# Patient Record
Sex: Male | Born: 1955 | Race: Black or African American | Hispanic: No | Marital: Married | Smoking: Current every day smoker
Health system: Southern US, Community
[De-identification: ages and names within clinical notes are randomized; demographics above are authoritative.]

## PROBLEM LIST (undated history)

## (undated) DIAGNOSIS — C383 Malignant neoplasm of mediastinum, part unspecified: Secondary | ICD-10-CM

## (undated) HISTORY — DX: Malignant neoplasm of mediastinum, part unspecified: C38.3

## (undated) MED FILL — Fosaprepitant Dimeglumine For IV Infusion 150 MG (Base Eq): INTRAVENOUS | Qty: 5 | Status: AC

---

## 2020-06-12 DIAGNOSIS — C3400 Malignant neoplasm of unspecified main bronchus: Secondary | ICD-10-CM | POA: Diagnosis not present

## 2020-06-28 LAB — CREATININE, SERUM: Creatinine: 0.8 (ref <=1.3)

## 2020-06-29 ENCOUNTER — Encounter: Payer: Self-pay | Admitting: *Deleted

## 2020-06-29 ENCOUNTER — Other Ambulatory Visit: Payer: Self-pay | Admitting: *Deleted

## 2020-06-29 NOTE — Patient Outreach (Signed)
[facility]) Care Management [facility] CM Telephone Outreach, insurance referral, new patient Unsuccessful outreach attempt  06/29/2020  Cali Cuartas September 05, 1956 [id number]  Unsuccessful outreach attempt to Rosann Auerbach, 64 y/o male, referred to [place] 06/27/2020 by [facility] CMA on insurance referral ([place]).  According to referral notes, patient had recent hospitalization at [facility], June 01, 2020.  Unable to leave HIPAA compliant voice mail message for patient, requesting return call back; phone rang multiple times without physical or voice mail pick up.  Plan:  [facility] CM unsuccessful patient outreach letter in mail requesting call back in writing  Will re-attempt [facility] CM telephone outreach within 4 business days if I do not hear back from patient first.  Oneta Rack, RN, BSN, [place] Coordinator [facility] Care Management  [phone]

## 2020-07-01 ENCOUNTER — Encounter: Payer: Self-pay | Admitting: Pharmacist

## 2020-07-01 DIAGNOSIS — C383 Malignant neoplasm of mediastinum, part unspecified: Secondary | ICD-10-CM

## 2020-07-04 ENCOUNTER — Encounter: Payer: Self-pay | Admitting: *Deleted

## 2020-07-04 ENCOUNTER — Other Ambulatory Visit: Payer: Self-pay | Admitting: *Deleted

## 2020-07-04 NOTE — Patient Outreach (Signed)
[facility]) Care Management [facility] CM Telephone Outreach, new patient, insurance referral Unsuccessful (consecutive) outreach attempt # 2- new patient  07/04/2020  Spencer Butler [birth year] [id number]  Unsuccessful (consecutive) second outreach attempt to Spencer Butler, 64 y/o male, referred to [place] 06/27/2020 by [facility] CMA on insurance referral ([place]).  According to referral notes, patient had recent hospitalization at [facility], June 01, 2020.  HIPAA compliant voice mail message left for patient, requesting return call back.  Plan:  Verified [facility] CM unsuccessful patient outreach letter in mail requesting call back in writing on 06/29/20  Will re-attempt [facility] CM telephone outreach within 4 business days if I do not hear back from patient first  Oneta Rack, RN, BSN, [employer] Coordinator [facility] Care Management  [phone]

## 2020-07-09 ENCOUNTER — Other Ambulatory Visit: Payer: Self-pay | Admitting: Hematology and Oncology

## 2020-07-09 ENCOUNTER — Encounter: Payer: Self-pay | Admitting: *Deleted

## 2020-07-09 ENCOUNTER — Other Ambulatory Visit: Payer: Self-pay | Admitting: *Deleted

## 2020-07-09 DIAGNOSIS — C383 Malignant neoplasm of mediastinum, part unspecified: Secondary | ICD-10-CM | POA: Diagnosis not present

## 2020-07-09 LAB — BASIC METABOLIC PANEL
BUN: 11 (ref 4–21)
CO2: 27 — AB (ref 13–22)
Chloride: 105 (ref 99–108)
Creatinine: 0.7 (ref 0.6–1.3)
Glucose: 94
Potassium: 4.2 (ref 3.4–5.3)
Sodium: 140 (ref 137–147)

## 2020-07-09 LAB — HEPATIC FUNCTION PANEL
ALT: 14 (ref 10–40)
AST: 21 (ref 14–40)
Alkaline Phosphatase: 107 (ref 25–125)
Bilirubin, Total: 0.4

## 2020-07-09 LAB — COMPREHENSIVE METABOLIC PANEL
Albumin: 4 (ref 3.5–5.0)
Calcium: 9.6 (ref 8.7–10.7)

## 2020-07-09 LAB — CBC AND DIFFERENTIAL
HCT: 38 — AB (ref 41–53)
Hemoglobin: 12.4 — AB (ref 13.5–17.5)
Neutrophils Absolute: 7
Platelets: 262 (ref 150–399)
WBC: 9.8

## 2020-07-09 LAB — CBC: RBC: 4.76 (ref 3.87–5.11)

## 2020-07-09 NOTE — Patient Outreach (Signed)
[facility] [facility]) Care Management [facility] CM Telephone Outreach, insurance referral Unsuccessful (consecutive) third outreach attempt- new patient  07/09/2020  Doyle Kunath 1955-11-09 [id number]  Unsuccessful (consecutive) third outreach attempt to Rosann Auerbach, 64 y/o male, referred to [place] 06/27/2020 by [facility] CMA on insurance referral ([place]).  According to referral notes, patient had recent hospitalization at [facility], June 01, 2020.  HIPAA compliant voice mail message left for patient, requesting return call back.  Plan:  Verified [facility] CM unsuccessful patient outreach letter in mail requesting call back in writing on 06/29/20  Will re-attempt [facility] CM telephone outreachfor final attempt in 4 weeksif I do not hear back from patient first  Oneta Rack, RN, BSN, [employer] Coordinator [facility] Care Management  [phone]

## 2020-07-10 ENCOUNTER — Telehealth: Payer: Self-pay | Admitting: Oncology

## 2020-07-10 ENCOUNTER — Other Ambulatory Visit: Payer: Self-pay

## 2020-07-10 ENCOUNTER — Inpatient Hospital Stay: Payer: 59 | Attending: Oncology

## 2020-07-10 VITALS — BP 142/74 | HR 64 | Temp 98.1°F | Resp 18 | Ht 67.0 in | Wt 120.0 lb

## 2020-07-10 DIAGNOSIS — C381 Malignant neoplasm of anterior mediastinum: Secondary | ICD-10-CM | POA: Insufficient documentation

## 2020-07-10 DIAGNOSIS — C383 Malignant neoplasm of mediastinum, part unspecified: Secondary | ICD-10-CM

## 2020-07-10 DIAGNOSIS — Z5111 Encounter for antineoplastic chemotherapy: Secondary | ICD-10-CM | POA: Diagnosis not present

## 2020-07-10 MED ORDER — SODIUM CHLORIDE 0.9 % IV SOLN
150.0000 mg | Freq: Once | INTRAVENOUS | Status: AC
  Administered 2020-07-10: 150 mg via INTRAVENOUS
  Filled 2020-07-10: qty 150

## 2020-07-10 MED ORDER — PALONOSETRON HCL INJECTION 0.25 MG/5ML
INTRAVENOUS | Status: AC
  Filled 2020-07-10: qty 5

## 2020-07-10 MED ORDER — SODIUM CHLORIDE 0.9 % IV SOLN
Freq: Once | INTRAVENOUS | Status: AC
  Filled 2020-07-10: qty 250

## 2020-07-10 MED ORDER — SODIUM CHLORIDE 0.9 % IV SOLN
10.0000 mg | Freq: Once | INTRAVENOUS | Status: AC
  Administered 2020-07-10: 10 mg via INTRAVENOUS
  Filled 2020-07-10: qty 1

## 2020-07-10 MED ORDER — HEPARIN SOD (PORK) LOCK FLUSH 100 UNIT/ML IV SOLN
500.0000 [IU] | Freq: Once | INTRAVENOUS | Status: AC | PRN
  Administered 2020-07-10: 500 [IU]
  Filled 2020-07-10: qty 5

## 2020-07-10 MED ORDER — SODIUM CHLORIDE 0.9 % IV SOLN
75.0000 mg/m2 | Freq: Once | INTRAVENOUS | Status: AC
  Administered 2020-07-10: 121 mg via INTRAVENOUS
  Filled 2020-07-10: qty 121

## 2020-07-10 MED ORDER — SODIUM CHLORIDE 0.9 % IV SOLN
100.0000 mg/m2 | Freq: Once | INTRAVENOUS | Status: AC
  Administered 2020-07-10: 160 mg via INTRAVENOUS
  Filled 2020-07-10: qty 8

## 2020-07-10 MED ORDER — SODIUM CHLORIDE 0.9% FLUSH
10.0000 mL | INTRAVENOUS | Status: DC | PRN
  Administered 2020-07-10: 10 mL
  Filled 2020-07-10: qty 10

## 2020-07-10 MED ORDER — PALONOSETRON HCL INJECTION 0.25 MG/5ML
0.2500 mg | Freq: Once | INTRAVENOUS | Status: AC
  Administered 2020-07-10: 0.25 mg via INTRAVENOUS

## 2020-07-10 MED ORDER — SODIUM CHLORIDE 0.9 % IV SOLN
Freq: Once | INTRAVENOUS | Status: AC
  Filled 2020-07-10: qty 10

## 2020-07-10 NOTE — Telephone Encounter (Signed)
Scheduled appts per treatment plan. Gave pt an appt calendar through November

## 2020-07-10 NOTE — Patient Instructions (Signed)
Etoposide, VP-16 injection What is this medicine? ETOPOSIDE, VP-16 (e toe POE side) is a chemotherapy drug. It is used to treat testicular cancer, lung cancer, and other cancers. This medicine may be used for other purposes; ask your health care provider or pharmacist if you have questions. COMMON BRAND NAME(S): Etopophos, Toposar, VePesid What should I tell my health care provider before I take this medicine? They need to know if you have any of these conditions:  infection  kidney disease  liver disease  low blood counts, like low white cell, platelet, or red cell counts  an unusual or allergic reaction to etoposide, other medicines, foods, dyes, or preservatives  pregnant or trying to get pregnant  breast-feeding How should I use this medicine? This medicine is for infusion into a vein. It is administered in a hospital or clinic by a specially trained health care professional. Talk to your pediatrician regarding the use of this medicine in children. Special care may be needed. Overdosage: If you think you have taken too much of this medicine contact a poison control center or emergency room at once. NOTE: This medicine is only for you. Do not share this medicine with others. What if I miss a dose? It is important not to miss your dose. Call your doctor or health care professional if you are unable to keep an appointment. What may interact with this medicine? This medicine may interact with the following medications:  warfarin This list may not describe all possible interactions. Give your health care provider a list of all the medicines, herbs, non-prescription drugs, or dietary supplements you use. Also tell them if you smoke, drink alcohol, or use illegal drugs. Some items may interact with your medicine. What should I watch for while using this medicine? Visit your doctor for checks on your progress. This drug may make you feel generally unwell. This is not uncommon, as  chemotherapy can affect healthy cells as well as cancer cells. Report any side effects. Continue your course of treatment even though you feel ill unless your doctor tells you to stop. In some cases, you may be given additional medicines to help with side effects. Follow all directions for their use. Call your doctor or health care professional for advice if you get a fever, chills or sore throat, or other symptoms of a cold or flu. Do not treat yourself. This drug decreases your body's ability to fight infections. Try to avoid being around people who are sick. This medicine may increase your risk to bruise or bleed. Call your doctor or health care professional if you notice any unusual bleeding. Talk to your doctor about your risk of cancer. You may be more at risk for certain types of cancers if you take this medicine. Do not become pregnant while taking this medicine or for at least 6 months after stopping it. Women should inform their doctor if they wish to become pregnant or think they might be pregnant. Women of child-bearing potential will need to have a negative pregnancy test before starting this medicine. There is a potential for serious side effects to an unborn child. Talk to your health care professional or pharmacist for more information. Do not breast-feed an infant while taking this medicine. Men must use a latex condom during sexual contact with a woman while taking this medicine and for at least 4 months after stopping it. A latex condom is needed even if you have had a vasectomy. Contact your doctor right away if your partner  becomes pregnant. Do not donate sperm while taking this medicine and for at least 4 months after you stop taking this medicine. Men should inform their doctors if they wish to father a child. This medicine may lower sperm counts. What side effects may I notice from receiving this medicine? Side effects that you should report to your doctor or health care professional  as soon as possible:  allergic reactions like skin rash, itching or hives, swelling of the face, lips, or tongue  low blood counts - this medicine may decrease the number of white blood cells, red blood cells, and platelets. You may be at increased risk for infections and bleeding  nausea, vomiting  redness, blistering, peeling or loosening of the skin, including inside the mouth  signs and symptoms of infection like fever; chills; cough; sore throat; pain or trouble passing urine  signs and symptoms of low red blood cells or anemia such as unusually weak or tired; feeling faint or lightheaded; falls; breathing problems  unusual bruising or bleeding Side effects that usually do not require medical attention (report to your doctor or health care professional if they continue or are bothersome):  changes in taste  diarrhea  hair loss  loss of appetite  mouth sores This list may not describe all possible side effects. Call your doctor for medical advice about side effects. You may report side effects to FDA at 1-800-FDA-1088. Where should I keep my medicine? This drug is given in a hospital or clinic and will not be stored at home. NOTE: This sheet is a summary. It may not cover all possible information. If you have questions about this medicine, talk to your doctor, pharmacist, or health care provider.  2020 Elsevier/Gold Standard (2018-11-10 16:57:15) Cisplatin injection What is this medicine? CISPLATIN (SIS pla tin) is a chemotherapy drug. It targets fast dividing cells, like cancer cells, and causes these cells to die. This medicine is used to treat many types of cancer like bladder, ovarian, and testicular cancers. This medicine may be used for other purposes; ask your health care provider or pharmacist if you have questions. COMMON BRAND NAME(S): Platinol, Platinol -AQ What should I tell my health care provider before I take this medicine? They need to know if you have any of  these conditions:  eye disease, vision problems  hearing problems  kidney disease  low blood counts, like white cells, platelets, or red blood cells  tingling of the fingers or toes, or other nerve disorder  an unusual or allergic reaction to cisplatin, carboplatin, oxaliplatin, other medicines, foods, dyes, or preservatives  pregnant or trying to get pregnant  breast-feeding How should I use this medicine? This drug is given as an infusion into a vein. It is administered in a hospital or clinic by a specially trained health care professional. Talk to your pediatrician regarding the use of this medicine in children. Special care may be needed. Overdosage: If you think you have taken too much of this medicine contact a poison control center or emergency room at once. NOTE: This medicine is only for you. Do not share this medicine with others. What if I miss a dose? It is important not to miss a dose. Call your doctor or health care professional if you are unable to keep an appointment. What may interact with this medicine? This medicine may interact with the following medications:  foscarnet  certain antibiotics like amikacin, gentamicin, neomycin, polymyxin B, streptomycin, tobramycin, vancomycin This list may not describe all possible  interactions. Give your health care provider a list of all the medicines, herbs, non-prescription drugs, or dietary supplements you use. Also tell them if you smoke, drink alcohol, or use illegal drugs. Some items may interact with your medicine. What should I watch for while using this medicine? Your condition will be monitored carefully while you are receiving this medicine. You will need important blood work done while you are taking this medicine. This drug may make you feel generally unwell. This is not uncommon, as chemotherapy can affect healthy cells as well as cancer cells. Report any side effects. Continue your course of treatment even though  you feel ill unless your doctor tells you to stop. This medicine may increase your risk of getting an infection. Call your healthcare professional for advice if you get a fever, chills, or sore throat, or other symptoms of a cold or flu. Do not treat yourself. Try to avoid being around people who are sick. Avoid taking medicines that contain aspirin, acetaminophen, ibuprofen, naproxen, or ketoprofen unless instructed by your healthcare professional. These medicines may hide a fever. This medicine may increase your risk to bruise or bleed. Call your doctor or health care professional if you notice any unusual bleeding. Be careful brushing and flossing your teeth or using a toothpick because you may get an infection or bleed more easily. If you have any dental work done, tell your dentist you are receiving this medicine. Do not become pregnant while taking this medicine or for 14 months after stopping it. Women should inform their healthcare professional if they wish to become pregnant or think they might be pregnant. Men should not father a child while taking this medicine and for 11 months after stopping it. There is potential for serious side effects to an unborn child. Talk to your healthcare professional for more information. Do not breast-feed an infant while taking this medicine. This medicine has caused ovarian failure in some women. This medicine may make it more difficult to get pregnant. Talk to your healthcare professional if you are concerned about your fertility. This medicine has caused decreased sperm counts in some men. This may make it more difficult to father a child. Talk to your healthcare professional if you are concerned about your fertility. Drink fluids as directed while you are taking this medicine. This will help protect your kidneys. Call your doctor or health care professional if you get diarrhea. Do not treat yourself. What side effects may I notice from receiving this  medicine? Side effects that you should report to your doctor or health care professional as soon as possible:  allergic reactions like skin rash, itching or hives, swelling of the face, lips, or tongue  blurred vision  changes in vision  decreased hearing or ringing of the ears  nausea, vomiting  pain, redness, or irritation at site where injected  pain, tingling, numbness in the hands or feet  signs and symptoms of bleeding such as bloody or black, tarry stools; red or dark brown urine; spitting up blood or brown material that looks like coffee grounds; red spots on the skin; unusual bruising or bleeding from the eyes, gums, or nose  signs and symptoms of infection like fever; chills; cough; sore throat; pain or trouble passing urine  signs and symptoms of kidney injury like trouble passing urine or change in the amount of urine  signs and symptoms of low red blood cells or anemia such as unusually weak or tired; feeling faint or lightheaded; falls; breathing  problems Side effects that usually do not require medical attention (report to your doctor or health care professional if they continue or are bothersome):  loss of appetite  mouth sores  muscle cramps This list may not describe all possible side effects. Call your doctor for medical advice about side effects. You may report side effects to FDA at 1-800-FDA-1088. Where should I keep my medicine? This drug is given in a hospital or clinic and will not be stored at home. NOTE: This sheet is a summary. It may not cover all possible information. If you have questions about this medicine, talk to your doctor, pharmacist, or health care provider.  2020 Elsevier/Gold Standard (2018-09-10 15:59:17)

## 2020-07-11 ENCOUNTER — Inpatient Hospital Stay: Payer: 59

## 2020-07-11 VITALS — BP 137/75 | HR 68 | Temp 98.7°F | Resp 18 | Ht 67.0 in | Wt 121.0 lb

## 2020-07-11 DIAGNOSIS — C383 Malignant neoplasm of mediastinum, part unspecified: Secondary | ICD-10-CM

## 2020-07-11 DIAGNOSIS — Z5111 Encounter for antineoplastic chemotherapy: Secondary | ICD-10-CM | POA: Diagnosis not present

## 2020-07-11 MED ORDER — SODIUM CHLORIDE 0.9 % IV SOLN
100.0000 mg/m2 | Freq: Once | INTRAVENOUS | Status: AC
  Administered 2020-07-11: 160 mg via INTRAVENOUS
  Filled 2020-07-11: qty 8

## 2020-07-11 MED ORDER — HEPARIN SOD (PORK) LOCK FLUSH 100 UNIT/ML IV SOLN
500.0000 [IU] | Freq: Once | INTRAVENOUS | Status: AC | PRN
  Administered 2020-07-11: 500 [IU]
  Filled 2020-07-11: qty 5

## 2020-07-11 MED ORDER — SODIUM CHLORIDE 0.9 % IV SOLN
10.0000 mg | Freq: Once | INTRAVENOUS | Status: AC
  Administered 2020-07-11: 10 mg via INTRAVENOUS
  Filled 2020-07-11: qty 10

## 2020-07-11 MED ORDER — SODIUM CHLORIDE 0.9% FLUSH
10.0000 mL | INTRAVENOUS | Status: DC | PRN
  Administered 2020-07-11 (×2): 10 mL
  Filled 2020-07-11: qty 10

## 2020-07-11 MED ORDER — SODIUM CHLORIDE 0.9 % IV SOLN
Freq: Once | INTRAVENOUS | Status: AC
  Filled 2020-07-11: qty 250

## 2020-07-11 NOTE — Patient Instructions (Signed)
Etoposide, VP-16 injection What is this medicine? ETOPOSIDE, VP-16 (e toe POE side) is a chemotherapy drug. It is used to treat testicular cancer, lung cancer, and other cancers. This medicine may be used for other purposes; ask your health care provider or pharmacist if you have questions. COMMON BRAND NAME(S): Etopophos, Toposar, VePesid What should I tell my health care provider before I take this medicine? They need to know if you have any of these conditions:  infection  kidney disease  liver disease  low blood counts, like low white cell, platelet, or red cell counts  an unusual or allergic reaction to etoposide, other medicines, foods, dyes, or preservatives  pregnant or trying to get pregnant  breast-feeding How should I use this medicine? This medicine is for infusion into a vein. It is administered in a hospital or clinic by a specially trained health care professional. Talk to your pediatrician regarding the use of this medicine in children. Special care may be needed. Overdosage: If you think you have taken too much of this medicine contact a poison control center or emergency room at once. NOTE: This medicine is only for you. Do not share this medicine with others. What if I miss a dose? It is important not to miss your dose. Call your doctor or health care professional if you are unable to keep an appointment. What may interact with this medicine? This medicine may interact with the following medications:  warfarin This list may not describe all possible interactions. Give your health care provider a list of all the medicines, herbs, non-prescription drugs, or dietary supplements you use. Also tell them if you smoke, drink alcohol, or use illegal drugs. Some items may interact with your medicine. What should I watch for while using this medicine? Visit your doctor for checks on your progress. This drug may make you feel generally unwell. This is not uncommon, as  chemotherapy can affect healthy cells as well as cancer cells. Report any side effects. Continue your course of treatment even though you feel ill unless your doctor tells you to stop. In some cases, you may be given additional medicines to help with side effects. Follow all directions for their use. Call your doctor or health care professional for advice if you get a fever, chills or sore throat, or other symptoms of a cold or flu. Do not treat yourself. This drug decreases your body's ability to fight infections. Try to avoid being around people who are sick. This medicine may increase your risk to bruise or bleed. Call your doctor or health care professional if you notice any unusual bleeding. Talk to your doctor about your risk of cancer. You may be more at risk for certain types of cancers if you take this medicine. Do not become pregnant while taking this medicine or for at least 6 months after stopping it. Women should inform their doctor if they wish to become pregnant or think they might be pregnant. Women of child-bearing potential will need to have a negative pregnancy test before starting this medicine. There is a potential for serious side effects to an unborn child. Talk to your health care professional or pharmacist for more information. Do not breast-feed an infant while taking this medicine. Men must use a latex condom during sexual contact with a woman while taking this medicine and for at least 4 months after stopping it. A latex condom is needed even if you have had a vasectomy. Contact your doctor right away if your partner  becomes pregnant. Do not donate sperm while taking this medicine and for at least 4 months after you stop taking this medicine. Men should inform their doctors if they wish to father a child. This medicine may lower sperm counts. What side effects may I notice from receiving this medicine? Side effects that you should report to your doctor or health care professional  as soon as possible:  allergic reactions like skin rash, itching or hives, swelling of the face, lips, or tongue  low blood counts - this medicine may decrease the number of white blood cells, red blood cells, and platelets. You may be at increased risk for infections and bleeding  nausea, vomiting  redness, blistering, peeling or loosening of the skin, including inside the mouth  signs and symptoms of infection like fever; chills; cough; sore throat; pain or trouble passing urine  signs and symptoms of low red blood cells or anemia such as unusually weak or tired; feeling faint or lightheaded; falls; breathing problems  unusual bruising or bleeding Side effects that usually do not require medical attention (report to your doctor or health care professional if they continue or are bothersome):  changes in taste  diarrhea  hair loss  loss of appetite  mouth sores This list may not describe all possible side effects. Call your doctor for medical advice about side effects. You may report side effects to FDA at 1-800-FDA-1088. Where should I keep my medicine? This drug is given in a hospital or clinic and will not be stored at home. NOTE: This sheet is a summary. It may not cover all possible information. If you have questions about this medicine, talk to your doctor, pharmacist, or health care provider.  2020 Elsevier/Gold Standard (2018-11-10 16:57:15)

## 2020-07-11 NOTE — Progress Notes (Signed)
Pt stable at time of discharge. 

## 2020-07-12 ENCOUNTER — Other Ambulatory Visit: Payer: Self-pay

## 2020-07-12 ENCOUNTER — Inpatient Hospital Stay: Payer: 59

## 2020-07-12 VITALS — BP 139/74 | HR 64 | Temp 98.0°F | Resp 18 | Ht 67.0 in | Wt 121.7 lb

## 2020-07-12 DIAGNOSIS — C383 Malignant neoplasm of mediastinum, part unspecified: Secondary | ICD-10-CM

## 2020-07-12 DIAGNOSIS — Z5111 Encounter for antineoplastic chemotherapy: Secondary | ICD-10-CM | POA: Diagnosis not present

## 2020-07-12 MED ORDER — HEPARIN SOD (PORK) LOCK FLUSH 100 UNIT/ML IV SOLN
500.0000 [IU] | Freq: Once | INTRAVENOUS | Status: AC | PRN
  Administered 2020-07-12: 500 [IU]
  Filled 2020-07-12: qty 5

## 2020-07-12 MED ORDER — SODIUM CHLORIDE 0.9 % IV SOLN
10.0000 mg | Freq: Once | INTRAVENOUS | Status: AC
  Administered 2020-07-12: 10 mg via INTRAVENOUS
  Filled 2020-07-12: qty 1

## 2020-07-12 MED ORDER — SODIUM CHLORIDE 0.9 % IV SOLN
100.0000 mg/m2 | Freq: Once | INTRAVENOUS | Status: AC
  Administered 2020-07-12: 160 mg via INTRAVENOUS
  Filled 2020-07-12: qty 8

## 2020-07-12 MED ORDER — SODIUM CHLORIDE 0.9 % IV SOLN
Freq: Once | INTRAVENOUS | Status: AC
  Filled 2020-07-12: qty 250

## 2020-07-12 MED ORDER — SODIUM CHLORIDE 0.9% FLUSH
10.0000 mL | INTRAVENOUS | Status: DC | PRN
  Administered 2020-07-12 (×2): 10 mL
  Filled 2020-07-12: qty 10

## 2020-07-12 NOTE — Progress Notes (Signed)
Pt stable at time of discharge. 

## 2020-07-12 NOTE — Patient Instructions (Signed)
Etoposide, VP-16 injection What is this medicine? ETOPOSIDE, VP-16 (e toe POE side) is a chemotherapy drug. It is used to treat testicular cancer, lung cancer, and other cancers. This medicine may be used for other purposes; ask your health care provider or pharmacist if you have questions. COMMON BRAND NAME(S): Etopophos, Toposar, VePesid What should I tell my health care provider before I take this medicine? They need to know if you have any of these conditions:  infection  kidney disease  liver disease  low blood counts, like low white cell, platelet, or red cell counts  an unusual or allergic reaction to etoposide, other medicines, foods, dyes, or preservatives  pregnant or trying to get pregnant  breast-feeding How should I use this medicine? This medicine is for infusion into a vein. It is administered in a hospital or clinic by a specially trained health care professional. Talk to your pediatrician regarding the use of this medicine in children. Special care may be needed. Overdosage: If you think you have taken too much of this medicine contact a poison control center or emergency room at once. NOTE: This medicine is only for you. Do not share this medicine with others. What if I miss a dose? It is important not to miss your dose. Call your doctor or health care professional if you are unable to keep an appointment. What may interact with this medicine? This medicine may interact with the following medications:  warfarin This list may not describe all possible interactions. Give your health care provider a list of all the medicines, herbs, non-prescription drugs, or dietary supplements you use. Also tell them if you smoke, drink alcohol, or use illegal drugs. Some items may interact with your medicine. What should I watch for while using this medicine? Visit your doctor for checks on your progress. This drug may make you feel generally unwell. This is not uncommon, as  chemotherapy can affect healthy cells as well as cancer cells. Report any side effects. Continue your course of treatment even though you feel ill unless your doctor tells you to stop. In some cases, you may be given additional medicines to help with side effects. Follow all directions for their use. Call your doctor or health care professional for advice if you get a fever, chills or sore throat, or other symptoms of a cold or flu. Do not treat yourself. This drug decreases your body's ability to fight infections. Try to avoid being around people who are sick. This medicine may increase your risk to bruise or bleed. Call your doctor or health care professional if you notice any unusual bleeding. Talk to your doctor about your risk of cancer. You may be more at risk for certain types of cancers if you take this medicine. Do not become pregnant while taking this medicine or for at least 6 months after stopping it. Women should inform their doctor if they wish to become pregnant or think they might be pregnant. Women of child-bearing potential will need to have a negative pregnancy test before starting this medicine. There is a potential for serious side effects to an unborn child. Talk to your health care professional or pharmacist for more information. Do not breast-feed an infant while taking this medicine. Men must use a latex condom during sexual contact with a woman while taking this medicine and for at least 4 months after stopping it. A latex condom is needed even if you have had a vasectomy. Contact your doctor right away if your partner  becomes pregnant. Do not donate sperm while taking this medicine and for at least 4 months after you stop taking this medicine. Men should inform their doctors if they wish to father a child. This medicine may lower sperm counts. What side effects may I notice from receiving this medicine? Side effects that you should report to your doctor or health care professional  as soon as possible:  allergic reactions like skin rash, itching or hives, swelling of the face, lips, or tongue  low blood counts - this medicine may decrease the number of white blood cells, red blood cells, and platelets. You may be at increased risk for infections and bleeding  nausea, vomiting  redness, blistering, peeling or loosening of the skin, including inside the mouth  signs and symptoms of infection like fever; chills; cough; sore throat; pain or trouble passing urine  signs and symptoms of low red blood cells or anemia such as unusually weak or tired; feeling faint or lightheaded; falls; breathing problems  unusual bruising or bleeding Side effects that usually do not require medical attention (report to your doctor or health care professional if they continue or are bothersome):  changes in taste  diarrhea  hair loss  loss of appetite  mouth sores This list may not describe all possible side effects. Call your doctor for medical advice about side effects. You may report side effects to FDA at 1-800-FDA-1088. Where should I keep my medicine? This drug is given in a hospital or clinic and will not be stored at home. NOTE: This sheet is a summary. It may not cover all possible information. If you have questions about this medicine, talk to your doctor, pharmacist, or health care provider.  2020 Elsevier/Gold Standard (2018-11-10 16:57:15)

## 2020-07-24 ENCOUNTER — Other Ambulatory Visit: Payer: Self-pay | Admitting: Hematology and Oncology

## 2020-07-25 NOTE — Progress Notes (Deleted)
  [facility]  [address] [place],  [place]  [address] [phone]  Clinic Day:  07/25/2020  Referring physician: Ernestene Kiel, MD   CHIEF COMPLAINT:  CC: ***  HISTORY OF PRESENT ILLNESS:  Sherrill Mckamie is a 64 y.o. male with a history of *** who I follow for   PHYSICAL EXAM:  There were no vitals taken for this visit. Wt Readings from Last 3 Encounters:  07/10/20 120 lb 12.8 oz (54.8 kg)  07/12/20 121 lb 11.2 oz (55.2 kg)  07/11/20 121 lb (54.9 kg)   There is no height or weight on file to calculate BMI. Performance status (ECOG): {CHL ONC [id number] Physical Exam  LABS:   CBC Latest Ref Rng & Units 07/09/2020  WBC - 9.8  Hemoglobin 13.5 - 17.5 12.4(A)  Hematocrit 41 - 53 38(A)  Platelets 150 - 399 262   CMP Latest Ref Rng & Units 07/09/2020 06/28/2020  BUN 4 - 21 11 -  Creatinine 0.6 - 1.3 0.7 0.8  Sodium 137 - 147 140 -  Potassium 3.4 - 5.3 4.2 -  Chloride 99 - 108 105 -  CO2 13 - 22 27(A) -  Calcium 8.7 - 10.7 9.6 -  Alkaline Phos 25 - 125 107 -  AST 14 - 40 21 -  ALT 10 - 40 14 -     No results found for: CEA1 / No results found for: CEA1 No results found for: PSA1 No results found for: BJY782 No results found for: NFA213  No results found for: TOTALPROTELP, ALBUMINELP, A1GS, A2GS, BETS, BETA2SER, GAMS, MSPIKE, SPEI No results found for: TIBC, FERRITIN, IRONPCTSAT No results found for: LDH  STUDIES:  No results found.    ASSESSMENT & PLAN:   Assessment/Plan:  Arsal Tappan is a 64 y.o. male ***   The patient understands all the plans discussed today and is in agreement with them.      Rontavious Albright Macarthur Critchley, MD

## 2020-07-27 ENCOUNTER — Encounter: Payer: Self-pay | Admitting: *Deleted

## 2020-07-29 NOTE — Progress Notes (Signed)
Spencer Butler  [address] [place],  [place]  [address] [phone]  Clinic Day:  07/30/2020  Referring physician: Ernestene Kiel, MD   HISTORY OF PRESENT ILLNESS:  The patient is a 64 y.o. male with limited stage small cell lung cancer, for which he is undergoing definitive chemoradiation.  He comes in today to be evaluated before he heads into his 3rd cycle of cisplatin/etoposide.  The patient claims to have tolerated his 2nd cycle of chemotherapy very well.  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  Despite using magic mouthwash, this problem persists.  Otherwise, he is doing well and has no problem proceeding with his 3rd cycle of cisplatin/etoposide today.   PHYSICAL EXAM:  Blood pressure (!) 158/85, pulse (!) 58, temperature 98.5 F (36.9 C), resp. rate 16, height 5\' 7"  (1.702 m), weight 119 lb 4.8 oz (54.1 kg), SpO2 98 %. Wt Readings from Last 3 Encounters:  07/30/20 119 lb 4.8 oz (54.1 kg)  07/24/20 117 lb 2 oz (53.1 kg)  07/10/20 120 lb 12.8 oz (54.8 kg)   Body mass index is 18.69 kg/m. Performance status (ECOG):  1 Physical Exam Constitutional:      General: He is not in acute distress.    Appearance: He is not ill-appearing.  HENT:     Head: Normocephalic.     Nose: Nose normal.     Mouth/Throat:     Mouth: Mucous membranes are moist.     Pharynx: Oropharynx is clear.  Cardiovascular:     Rate and Rhythm: Normal rate and regular rhythm.     Heart sounds: No murmur heard.  No friction rub. No gallop.   Pulmonary:     Effort: Pulmonary effort is normal.     Breath sounds: Normal breath sounds.  Abdominal:     General: Bowel sounds are normal. There is no distension.     Palpations: Abdomen is soft.     Tenderness: There is no abdominal tenderness.  Musculoskeletal:     Cervical back: No rigidity.     Right lower leg: No edema.     Left lower leg: No edema.  Skin:    General: Skin is  warm and dry.     Findings: No bruising, erythema or rash.  Neurological:     General: No focal deficit present.     Mental Status: He is oriented to person, place, and time.     LABS:   CBC Latest Ref Rng & Units 07/30/2020 07/09/2020  WBC 4.0 - 10.5 K/uL 1.2(L) 9.8  Hemoglobin 13.0 - 17.0 g/dL 11.2(L) 12.4(A)  Hematocrit 39 - 52 % 35.1(L) 38(A)  Platelets 150 - 400 K/uL 173 262   CMP Latest Ref Rng & Units 07/30/2020 07/09/2020 06/28/2020  Glucose 70 - 99 mg/dL 90 - -  BUN 8 - 23 mg/dL 9 11 -  Creatinine 0.61 - 1.24 mg/dL 0.78 0.7 0.8  Sodium 135 - 145 mmol/L 143 140 -  Potassium 3.5 - 5.1 mmol/L 4.3 4.2 -  Chloride 98 - 111 mmol/L 105 105 -  CO2 22 - 32 mmol/L 25 27(A) -  Calcium 8.9 - 10.3 mg/dL 9.9 9.6 -  Total Protein 6.5 - 8.1 g/dL 7.3 - -  Total Bilirubin 0.3 - 1.2 mg/dL 0.6 - -  Alkaline Phos 38 - 126 U/L 62 107 -  AST 15 - 41 U/L 14(L) 21 -  ALT 0 - 44 U/L 10 14 -  ASSESSMENT & PLAN:   Assessment/Plan:  A 64 y.o. male with limited stage small cell lung cancer.  Based upon his low white count, his 3rd cycle of cisplatin/etoposide will be delayed for 1 week.  His low white count may also delay his daily radiation. I will leave it up to radiation oncology to make that decision.   Of note, this patient's voice is much stronger today, with minimal hoarseness appreciated.  This likely reflects the positive treatment effect he is having from his concurrent chemoradiation.  He clearly appears to be deriving a clinical benefit from his chemoradiation.  I will see him back in 3-4 weeks before he heads into his 4th and final cycle of cisplatin/etoposide. The patient understands all the plans discussed today and is in agreement with them.      Christyne Mccain Macarthur Critchley, MD

## 2020-07-30 ENCOUNTER — Inpatient Hospital Stay: Payer: 59 | Admitting: Oncology

## 2020-07-30 ENCOUNTER — Other Ambulatory Visit: Payer: 59

## 2020-07-30 ENCOUNTER — Other Ambulatory Visit: Payer: Self-pay

## 2020-07-30 ENCOUNTER — Inpatient Hospital Stay (INDEPENDENT_AMBULATORY_CARE_PROVIDER_SITE_OTHER): Payer: 59 | Admitting: Oncology

## 2020-07-30 ENCOUNTER — Telehealth: Payer: Self-pay

## 2020-07-30 ENCOUNTER — Inpatient Hospital Stay: Payer: 59

## 2020-07-30 ENCOUNTER — Inpatient Hospital Stay: Payer: 59 | Attending: Oncology

## 2020-07-30 ENCOUNTER — Encounter: Payer: Self-pay | Admitting: Oncology

## 2020-07-30 VITALS — BP 158/85 | HR 58 | Temp 98.5°F | Resp 16 | Ht 67.0 in | Wt 119.3 lb

## 2020-07-30 DIAGNOSIS — Z923 Personal history of irradiation: Secondary | ICD-10-CM | POA: Insufficient documentation

## 2020-07-30 DIAGNOSIS — R1310 Dysphagia, unspecified: Secondary | ICD-10-CM | POA: Diagnosis not present

## 2020-07-30 DIAGNOSIS — C383 Malignant neoplasm of mediastinum, part unspecified: Secondary | ICD-10-CM

## 2020-07-30 DIAGNOSIS — Z5111 Encounter for antineoplastic chemotherapy: Secondary | ICD-10-CM | POA: Insufficient documentation

## 2020-07-30 DIAGNOSIS — C381 Malignant neoplasm of anterior mediastinum: Secondary | ICD-10-CM | POA: Insufficient documentation

## 2020-07-30 LAB — CBC WITH DIFFERENTIAL (CANCER CENTER ONLY)
Abs Immature Granulocytes: 0 10*3/uL (ref 0.00–0.07)
Basophils Absolute: 0 10*3/uL (ref 0.0–0.1)
Basophils Relative: 1 %
Eosinophils Absolute: 0.1 10*3/uL (ref 0.0–0.5)
Eosinophils Relative: 6 %
HCT: 35.1 % — ABNORMAL LOW (ref 39.0–52.0)
Hemoglobin: 11.2 g/dL — ABNORMAL LOW (ref 13.0–17.0)
Immature Granulocytes: 0 %
Lymphocytes Relative: 40 %
Lymphs Abs: 0.5 10*3/uL — ABNORMAL LOW (ref 0.7–4.0)
MCH: 27.1 pg (ref 26.0–34.0)
MCHC: 31.9 g/dL (ref 30.0–36.0)
MCV: 84.8 fL (ref 80.0–100.0)
Monocytes Absolute: 0.4 10*3/uL (ref 0.1–1.0)
Monocytes Relative: 37 %
Neutro Abs: 0.2 10*3/uL — CL (ref 1.7–7.7)
Neutrophils Relative %: 16 %
Platelet Count: 173 10*3/uL (ref 150–400)
RBC: 4.14 MIL/uL — ABNORMAL LOW (ref 4.22–5.81)
RDW: 17.2 % — ABNORMAL HIGH (ref 11.5–15.5)
WBC Count: 1.2 10*3/uL — ABNORMAL LOW (ref 4.0–10.5)
nRBC: 0 % (ref 0.0–0.2)

## 2020-07-30 LAB — MAGNESIUM: Magnesium: 2.1 mg/dL (ref 1.7–2.4)

## 2020-07-30 LAB — CMP (CANCER CENTER ONLY)
ALT: 10 U/L (ref 0–44)
AST: 14 U/L — ABNORMAL LOW (ref 15–41)
Albumin: 4 g/dL (ref 3.5–5.0)
Alkaline Phosphatase: 62 U/L (ref 38–126)
Anion gap: 13 (ref 5–15)
BUN: 9 mg/dL (ref 8–23)
CO2: 25 mmol/L (ref 22–32)
Calcium: 9.9 mg/dL (ref 8.9–10.3)
Chloride: 105 mmol/L (ref 98–111)
Creatinine: 0.78 mg/dL (ref 0.61–1.24)
GFR, Estimated: >60 mL/min (ref >60)
Glucose, Bld: 90 mg/dL (ref 70–99)
Potassium: 4.3 mmol/L (ref 3.5–5.1)
Sodium: 143 mmol/L (ref 135–145)
Total Bilirubin: 0.6 mg/dL (ref 0.3–1.2)
Total Protein: 7.3 g/dL (ref 6.5–8.1)

## 2020-07-30 MED FILL — Dexamethasone Sodium Phosphate Inj 100 MG/10ML: INTRAMUSCULAR | Qty: 1 | Status: AC

## 2020-07-30 MED FILL — Potassium Chloride Inj 2 mEq/ML: INTRAVENOUS | Qty: 10 | Status: CN

## 2020-07-31 ENCOUNTER — Inpatient Hospital Stay: Payer: 59

## 2020-08-01 ENCOUNTER — Other Ambulatory Visit: Payer: Self-pay | Admitting: Hematology and Oncology

## 2020-08-01 ENCOUNTER — Inpatient Hospital Stay: Payer: 59

## 2020-08-01 ENCOUNTER — Other Ambulatory Visit: Payer: Self-pay

## 2020-08-01 DIAGNOSIS — T451X5A Adverse effect of antineoplastic and immunosuppressive drugs, initial encounter: Secondary | ICD-10-CM

## 2020-08-02 ENCOUNTER — Inpatient Hospital Stay: Payer: 59

## 2020-08-02 ENCOUNTER — Ambulatory Visit: Payer: 59 | Admitting: Oncology

## 2020-08-03 ENCOUNTER — Encounter: Payer: Self-pay | Admitting: Oncology

## 2020-08-06 ENCOUNTER — Ambulatory Visit: Payer: 59 | Admitting: Oncology

## 2020-08-06 ENCOUNTER — Inpatient Hospital Stay: Payer: 59

## 2020-08-06 ENCOUNTER — Other Ambulatory Visit: Payer: 59

## 2020-08-06 ENCOUNTER — Other Ambulatory Visit: Payer: Self-pay

## 2020-08-06 DIAGNOSIS — Z5111 Encounter for antineoplastic chemotherapy: Secondary | ICD-10-CM | POA: Diagnosis not present

## 2020-08-06 DIAGNOSIS — C383 Malignant neoplasm of mediastinum, part unspecified: Secondary | ICD-10-CM

## 2020-08-06 LAB — CBC WITH DIFFERENTIAL (CANCER CENTER ONLY)
Abs Immature Granulocytes: 0.01 10*3/uL (ref 0.00–0.07)
Basophils Absolute: 0 10*3/uL (ref 0.0–0.1)
Basophils Relative: 1 %
Eosinophils Absolute: 0 10*3/uL (ref 0.0–0.5)
Eosinophils Relative: 1 %
HCT: 36.5 % — ABNORMAL LOW (ref 39.0–52.0)
Hemoglobin: 11.4 g/dL — ABNORMAL LOW (ref 13.0–17.0)
Immature Granulocytes: 0 %
Lymphocytes Relative: 18 %
Lymphs Abs: 0.6 10*3/uL — ABNORMAL LOW (ref 0.7–4.0)
MCH: 26.8 pg (ref 26.0–34.0)
MCHC: 31.2 g/dL (ref 30.0–36.0)
MCV: 85.9 fL (ref 80.0–100.0)
Monocytes Absolute: 0.7 10*3/uL (ref 0.1–1.0)
Monocytes Relative: 23 %
Neutro Abs: 1.8 10*3/uL (ref 1.7–7.7)
Neutrophils Relative %: 57 %
Platelet Count: 287 10*3/uL (ref 150–400)
RBC: 4.25 MIL/uL (ref 4.22–5.81)
RDW: 18.7 % — ABNORMAL HIGH (ref 11.5–15.5)
WBC Count: 3.1 10*3/uL — ABNORMAL LOW (ref 4.0–10.5)
nRBC: 0 % (ref 0.0–0.2)

## 2020-08-06 LAB — CMP (CANCER CENTER ONLY)
ALT: 9 U/L (ref 0–44)
AST: 14 U/L — ABNORMAL LOW (ref 15–41)
Albumin: 3.8 g/dL (ref 3.5–5.0)
Alkaline Phosphatase: 60 U/L (ref 38–126)
Anion gap: 5 (ref 5–15)
BUN: 8 mg/dL (ref 8–23)
CO2: 28 mmol/L (ref 22–32)
Calcium: 8.9 mg/dL (ref 8.9–10.3)
Chloride: 107 mmol/L (ref 98–111)
Creatinine: 0.9 mg/dL (ref 0.61–1.24)
GFR, Estimated: >60 mL/min (ref >60)
Glucose, Bld: 84 mg/dL (ref 70–99)
Potassium: 3.7 mmol/L (ref 3.5–5.1)
Sodium: 140 mmol/L (ref 135–145)
Total Bilirubin: 0.6 mg/dL (ref 0.3–1.2)
Total Protein: 6.7 g/dL (ref 6.5–8.1)

## 2020-08-06 LAB — MAGNESIUM: Magnesium: 1.8 mg/dL (ref 1.7–2.4)

## 2020-08-06 MED FILL — Fosaprepitant Dimeglumine For IV Infusion 150 MG (Base Eq): INTRAVENOUS | Qty: 5 | Status: AC

## 2020-08-06 MED FILL — Dexamethasone Sodium Phosphate Inj 100 MG/10ML: INTRAMUSCULAR | Qty: 1 | Status: AC

## 2020-08-07 ENCOUNTER — Inpatient Hospital Stay: Payer: 59

## 2020-08-07 VITALS — BP 153/81 | HR 71 | Temp 98.1°F | Resp 18 | Ht 67.0 in | Wt 122.2 lb

## 2020-08-07 DIAGNOSIS — C383 Malignant neoplasm of mediastinum, part unspecified: Secondary | ICD-10-CM

## 2020-08-07 DIAGNOSIS — Z5111 Encounter for antineoplastic chemotherapy: Secondary | ICD-10-CM | POA: Diagnosis not present

## 2020-08-07 MED ORDER — SODIUM CHLORIDE 0.9 % IV SOLN
150.0000 mg | Freq: Once | INTRAVENOUS | Status: AC
  Administered 2020-08-07: 150 mg via INTRAVENOUS
  Filled 2020-08-07: qty 5

## 2020-08-07 MED ORDER — SODIUM CHLORIDE 0.9% FLUSH
10.0000 mL | INTRAVENOUS | Status: DC | PRN
  Administered 2020-08-07: 10 mL
  Filled 2020-08-07: qty 10

## 2020-08-07 MED ORDER — HEPARIN SOD (PORK) LOCK FLUSH 100 UNIT/ML IV SOLN
500.0000 [IU] | Freq: Once | INTRAVENOUS | Status: AC | PRN
  Administered 2020-08-07: 500 [IU]
  Filled 2020-08-07: qty 5

## 2020-08-07 MED ORDER — PALONOSETRON HCL INJECTION 0.25 MG/5ML
0.2500 mg | Freq: Once | INTRAVENOUS | Status: AC
  Administered 2020-08-07: 0.25 mg via INTRAVENOUS

## 2020-08-07 MED ORDER — SODIUM CHLORIDE 0.9 % IV SOLN
Freq: Once | INTRAVENOUS | Status: AC
  Filled 2020-08-07: qty 250

## 2020-08-07 MED ORDER — SODIUM CHLORIDE 0.9 % IV SOLN
100.0000 mg/m2 | Freq: Once | INTRAVENOUS | Status: AC
  Administered 2020-08-07: 160 mg via INTRAVENOUS
  Filled 2020-08-07 (×2): qty 8

## 2020-08-07 MED ORDER — SODIUM CHLORIDE 0.9 % IV SOLN
Freq: Once | INTRAVENOUS | Status: AC
  Filled 2020-08-07: qty 10

## 2020-08-07 MED ORDER — SODIUM CHLORIDE 0.9 % IV SOLN
75.0000 mg/m2 | Freq: Once | INTRAVENOUS | Status: AC
  Administered 2020-08-07: 121 mg via INTRAVENOUS
  Filled 2020-08-07 (×2): qty 121

## 2020-08-07 MED ORDER — SODIUM CHLORIDE 0.9 % IV SOLN
10.0000 mg | Freq: Once | INTRAVENOUS | Status: AC
  Administered 2020-08-07: 10 mg via INTRAVENOUS
  Filled 2020-08-07: qty 1

## 2020-08-07 MED ORDER — PALONOSETRON HCL INJECTION 0.25 MG/5ML
INTRAVENOUS | Status: AC
  Filled 2020-08-07: qty 5

## 2020-08-07 NOTE — Progress Notes (Signed)
PT STABLE AT TIME OF DISCHARGE 

## 2020-08-08 ENCOUNTER — Other Ambulatory Visit: Payer: Self-pay | Admitting: *Deleted

## 2020-08-08 ENCOUNTER — Inpatient Hospital Stay: Payer: 59

## 2020-08-08 ENCOUNTER — Other Ambulatory Visit: Payer: Self-pay

## 2020-08-08 ENCOUNTER — Encounter: Payer: Self-pay | Admitting: *Deleted

## 2020-08-08 VITALS — BP 166/87 | HR 73 | Temp 98.3°F | Resp 18 | Ht 67.0 in | Wt 126.0 lb

## 2020-08-08 DIAGNOSIS — C383 Malignant neoplasm of mediastinum, part unspecified: Secondary | ICD-10-CM

## 2020-08-08 DIAGNOSIS — Z5111 Encounter for antineoplastic chemotherapy: Secondary | ICD-10-CM | POA: Diagnosis not present

## 2020-08-08 MED ORDER — SODIUM CHLORIDE 0.9 % IV SOLN
Freq: Once | INTRAVENOUS | Status: AC
  Filled 2020-08-08: qty 250

## 2020-08-08 MED ORDER — HEPARIN SOD (PORK) LOCK FLUSH 100 UNIT/ML IV SOLN
500.0000 [IU] | Freq: Once | INTRAVENOUS | Status: AC | PRN
  Administered 2020-08-08: 500 [IU]
  Filled 2020-08-08: qty 5

## 2020-08-08 MED ORDER — SODIUM CHLORIDE 0.9 % IV SOLN
100.0000 mg/m2 | Freq: Once | INTRAVENOUS | Status: AC
  Administered 2020-08-08: 160 mg via INTRAVENOUS
  Filled 2020-08-08: qty 8

## 2020-08-08 MED ORDER — SODIUM CHLORIDE 0.9 % IV SOLN
10.0000 mg | Freq: Once | INTRAVENOUS | Status: AC
  Administered 2020-08-08: 10 mg via INTRAVENOUS
  Filled 2020-08-08: qty 10

## 2020-08-08 MED ORDER — SODIUM CHLORIDE 0.9% FLUSH
10.0000 mL | INTRAVENOUS | Status: DC | PRN
  Administered 2020-08-08: 10 mL
  Filled 2020-08-08: qty 10

## 2020-08-08 NOTE — Progress Notes (Signed)
PT STABLE AT TIME OF DISCHARGE 

## 2020-08-08 NOTE — Patient Instructions (Signed)
Etoposide, VP-16 injection What is this medicine? ETOPOSIDE, VP-16 (e toe POE side) is a chemotherapy drug. It is used to treat testicular cancer, lung cancer, and other cancers. This medicine may be used for other purposes; ask your health care provider or pharmacist if you have questions. COMMON BRAND NAME(S): Etopophos, Toposar, VePesid What should I tell my health care provider before I take this medicine? They need to know if you have any of these conditions:  infection  kidney disease  liver disease  low blood counts, like low white cell, platelet, or red cell counts  an unusual or allergic reaction to etoposide, other medicines, foods, dyes, or preservatives  pregnant or trying to get pregnant  breast-feeding How should I use this medicine? This medicine is for infusion into a vein. It is administered in a hospital or clinic by a specially trained health care professional. Talk to your pediatrician regarding the use of this medicine in children. Special care may be needed. Overdosage: If you think you have taken too much of this medicine contact a poison control center or emergency room at once. NOTE: This medicine is only for you. Do not share this medicine with others. What if I miss a dose? It is important not to miss your dose. Call your doctor or health care professional if you are unable to keep an appointment. What may interact with this medicine? This medicine may interact with the following medications:  warfarin This list may not describe all possible interactions. Give your health care provider a list of all the medicines, herbs, non-prescription drugs, or dietary supplements you use. Also tell them if you smoke, drink alcohol, or use illegal drugs. Some items may interact with your medicine. What should I watch for while using this medicine? Visit your doctor for checks on your progress. This drug may make you feel generally unwell. This is not uncommon, as  chemotherapy can affect healthy cells as well as cancer cells. Report any side effects. Continue your course of treatment even though you feel ill unless your doctor tells you to stop. In some cases, you may be given additional medicines to help with side effects. Follow all directions for their use. Call your doctor or health care professional for advice if you get a fever, chills or sore throat, or other symptoms of a cold or flu. Do not treat yourself. This drug decreases your body's ability to fight infections. Try to avoid being around people who are sick. This medicine may increase your risk to bruise or bleed. Call your doctor or health care professional if you notice any unusual bleeding. Talk to your doctor about your risk of cancer. You may be more at risk for certain types of cancers if you take this medicine. Do not become pregnant while taking this medicine or for at least 6 months after stopping it. Women should inform their doctor if they wish to become pregnant or think they might be pregnant. Women of child-bearing potential will need to have a negative pregnancy test before starting this medicine. There is a potential for serious side effects to an unborn child. Talk to your health care professional or pharmacist for more information. Do not breast-feed an infant while taking this medicine. Men must use a latex condom during sexual contact with a woman while taking this medicine and for at least 4 months after stopping it. A latex condom is needed even if you have had a vasectomy. Contact your doctor right away if your partner  becomes pregnant. Do not donate sperm while taking this medicine and for at least 4 months after you stop taking this medicine. Men should inform their doctors if they wish to father a child. This medicine may lower sperm counts. What side effects may I notice from receiving this medicine? Side effects that you should report to your doctor or health care professional  as soon as possible:  allergic reactions like skin rash, itching or hives, swelling of the face, lips, or tongue  low blood counts - this medicine may decrease the number of white blood cells, red blood cells, and platelets. You may be at increased risk for infections and bleeding  nausea, vomiting  redness, blistering, peeling or loosening of the skin, including inside the mouth  signs and symptoms of infection like fever; chills; cough; sore throat; pain or trouble passing urine  signs and symptoms of low red blood cells or anemia such as unusually weak or tired; feeling faint or lightheaded; falls; breathing problems  unusual bruising or bleeding Side effects that usually do not require medical attention (report to your doctor or health care professional if they continue or are bothersome):  changes in taste  diarrhea  hair loss  loss of appetite  mouth sores This list may not describe all possible side effects. Call your doctor for medical advice about side effects. You may report side effects to FDA at 1-800-FDA-1088. Where should I keep my medicine? This drug is given in a hospital or clinic and will not be stored at home. NOTE: This sheet is a summary. It may not cover all possible information. If you have questions about this medicine, talk to your doctor, pharmacist, or health care provider.  2020 Elsevier/Gold Standard (2018-11-10 16:57:15)

## 2020-08-08 NOTE — Patient Outreach (Signed)
[facility]) Care Management [facility] CM Telephone Outreach, insurance referral from 06/27/20 Unsuccessful (consecutive) outreach attempt # 4 wihtout patient call-back  08/08/2020  Vada Yellen [birth year] [id number]  Unsuccessful(consecutive) fourthoutreach attempt to Rosann Auerbach, 64 y/o male, referred to [facility] 06/27/2020 by Sarah [facility] CMA on insurance referral ([place]).  According to referral notes, patient had recent hospitalization at [facility], June 01, 2020.  HIPAA compliant voice mail message left for patient, requesting return call back.  Plan:  Verified[facility] CM unsuccessful patient outreach letter in mail requesting call back in writingon 06/29/20  Will make patient inactive with [facility] CM program and make patient's PCP aware of same- will send case closure letter as unable to contact  Oneta Rack, RN, BSN, [facility]  [phone]

## 2020-08-09 ENCOUNTER — Inpatient Hospital Stay: Payer: 59

## 2020-08-09 VITALS — BP 153/83 | HR 72 | Temp 98.3°F | Resp 18 | Ht 67.0 in | Wt 125.3 lb

## 2020-08-09 DIAGNOSIS — Z5111 Encounter for antineoplastic chemotherapy: Secondary | ICD-10-CM | POA: Diagnosis not present

## 2020-08-09 DIAGNOSIS — C383 Malignant neoplasm of mediastinum, part unspecified: Secondary | ICD-10-CM

## 2020-08-09 MED ORDER — SODIUM CHLORIDE 0.9 % IV SOLN
100.0000 mg/m2 | Freq: Once | INTRAVENOUS | Status: AC
  Administered 2020-08-09: 160 mg via INTRAVENOUS
  Filled 2020-08-09: qty 8

## 2020-08-09 MED ORDER — HEPARIN SOD (PORK) LOCK FLUSH 100 UNIT/ML IV SOLN
500.0000 [IU] | Freq: Once | INTRAVENOUS | Status: AC | PRN
  Administered 2020-08-09: 500 [IU]
  Filled 2020-08-09: qty 5

## 2020-08-09 MED ORDER — SODIUM CHLORIDE 0.9% FLUSH
10.0000 mL | INTRAVENOUS | Status: DC | PRN
  Administered 2020-08-09: 10 mL
  Filled 2020-08-09: qty 10

## 2020-08-09 MED ORDER — SODIUM CHLORIDE 0.9 % IV SOLN
10.0000 mg | Freq: Once | INTRAVENOUS | Status: AC
  Administered 2020-08-09: 10 mg via INTRAVENOUS
  Filled 2020-08-09: qty 10

## 2020-08-09 MED ORDER — SODIUM CHLORIDE 0.9 % IV SOLN
Freq: Once | INTRAVENOUS | Status: AC
  Filled 2020-08-09: qty 250

## 2020-08-09 NOTE — Progress Notes (Signed)
PT STABLE AT TIME OF DISCHARGE 

## 2020-08-09 NOTE — Patient Instructions (Signed)
Etoposide, VP-16 injection What is this medicine? ETOPOSIDE, VP-16 (e toe POE side) is a chemotherapy drug. It is used to treat testicular cancer, lung cancer, and other cancers. This medicine may be used for other purposes; ask your health care provider or pharmacist if you have questions. COMMON BRAND NAME(S): Etopophos, Toposar, VePesid What should I tell my health care provider before I take this medicine? They need to know if you have any of these conditions:  infection  kidney disease  liver disease  low blood counts, like low white cell, platelet, or red cell counts  an unusual or allergic reaction to etoposide, other medicines, foods, dyes, or preservatives  pregnant or trying to get pregnant  breast-feeding How should I use this medicine? This medicine is for infusion into a vein. It is administered in a hospital or clinic by a specially trained health care professional. Talk to your pediatrician regarding the use of this medicine in children. Special care may be needed. Overdosage: If you think you have taken too much of this medicine contact a poison control center or emergency room at once. NOTE: This medicine is only for you. Do not share this medicine with others. What if I miss a dose? It is important not to miss your dose. Call your doctor or health care professional if you are unable to keep an appointment. What may interact with this medicine? This medicine may interact with the following medications:  warfarin This list may not describe all possible interactions. Give your health care provider a list of all the medicines, herbs, non-prescription drugs, or dietary supplements you use. Also tell them if you smoke, drink alcohol, or use illegal drugs. Some items may interact with your medicine. What should I watch for while using this medicine? Visit your doctor for checks on your progress. This drug may make you feel generally unwell. This is not uncommon, as  chemotherapy can affect healthy cells as well as cancer cells. Report any side effects. Continue your course of treatment even though you feel ill unless your doctor tells you to stop. In some cases, you may be given additional medicines to help with side effects. Follow all directions for their use. Call your doctor or health care professional for advice if you get a fever, chills or sore throat, or other symptoms of a cold or flu. Do not treat yourself. This drug decreases your body's ability to fight infections. Try to avoid being around people who are sick. This medicine may increase your risk to bruise or bleed. Call your doctor or health care professional if you notice any unusual bleeding. Talk to your doctor about your risk of cancer. You may be more at risk for certain types of cancers if you take this medicine. Do not become pregnant while taking this medicine or for at least 6 months after stopping it. Women should inform their doctor if they wish to become pregnant or think they might be pregnant. Women of child-bearing potential will need to have a negative pregnancy test before starting this medicine. There is a potential for serious side effects to an unborn child. Talk to your health care professional or pharmacist for more information. Do not breast-feed an infant while taking this medicine. Men must use a latex condom during sexual contact with a woman while taking this medicine and for at least 4 months after stopping it. A latex condom is needed even if you have had a vasectomy. Contact your doctor right away if your partner  becomes pregnant. Do not donate sperm while taking this medicine and for at least 4 months after you stop taking this medicine. Men should inform their doctors if they wish to father a child. This medicine may lower sperm counts. What side effects may I notice from receiving this medicine? Side effects that you should report to your doctor or health care professional  as soon as possible:  allergic reactions like skin rash, itching or hives, swelling of the face, lips, or tongue  low blood counts - this medicine may decrease the number of white blood cells, red blood cells, and platelets. You may be at increased risk for infections and bleeding  nausea, vomiting  redness, blistering, peeling or loosening of the skin, including inside the mouth  signs and symptoms of infection like fever; chills; cough; sore throat; pain or trouble passing urine  signs and symptoms of low red blood cells or anemia such as unusually weak or tired; feeling faint or lightheaded; falls; breathing problems  unusual bruising or bleeding Side effects that usually do not require medical attention (report to your doctor or health care professional if they continue or are bothersome):  changes in taste  diarrhea  hair loss  loss of appetite  mouth sores This list may not describe all possible side effects. Call your doctor for medical advice about side effects. You may report side effects to FDA at 1-800-FDA-1088. Where should I keep my medicine? This drug is given in a hospital or clinic and will not be stored at home. NOTE: This sheet is a summary. It may not cover all possible information. If you have questions about this medicine, talk to your doctor, pharmacist, or health care provider.  2020 Elsevier/Gold Standard (2018-11-10 16:57:15)

## 2020-08-19 NOTE — Progress Notes (Signed)
[facility]  [address] [place],  [place]  [address] [phone]  Clinic Day:  08/20/2020  Referring physician: Ernestene Kiel, MD   HISTORY OF PRESENT ILLNESS:  The patient is a 64 y.o. male with limited stage small cell lung cancer, for which he is undergoing definitive chemoradiation.  He comes in today to be evaluated before he heads into his 4th and final cycle of cisplatin/etoposide.  The patient claims to have tolerated his 3rd cycle of chemotherapy very well.  He recently finished his concurrent radiation.  The major problem he continues to have is moderate dysphagia.  He denies having any respiratory problems related to his underlying disease.  PHYSICAL EXAM:  Blood pressure (!) 148/81, pulse 74, temperature 98.3 F (36.8 C), temperature source Oral, resp. rate 16, height 5\' 7"  (1.702 m), weight 120 lb 11.2 oz (54.7 kg), SpO2 97 %. Wt Readings from Last 3 Encounters:  08/20/20 120 lb 11.2 oz (54.7 kg)  08/09/20 125 lb 4.8 oz (56.8 kg)  08/07/20 124 lb 8 oz (56.5 kg)   Body mass index is 18.9 kg/m. Performance status (ECOG):  1 Physical Exam Constitutional:      General: He is not in acute distress.    Appearance: He is not ill-appearing.  HENT:     Head: Normocephalic.     Nose: Nose normal.     Mouth/Throat:     Mouth: Mucous membranes are moist.     Pharynx: Oropharynx is clear.  Cardiovascular:     Rate and Rhythm: Normal rate and regular rhythm.     Heart sounds: No murmur heard.  No friction rub. No gallop.   Pulmonary:     Effort: Pulmonary effort is normal.     Breath sounds: Normal breath sounds.  Abdominal:     General: Bowel sounds are normal. There is no distension.     Palpations: Abdomen is soft.     Tenderness: There is no abdominal tenderness.  Musculoskeletal:     Cervical back: No rigidity.     Right lower leg: No edema.     Left lower leg: No edema.  Skin:    General: Skin is warm and dry.      Findings: No bruising, erythema or rash.  Neurological:     General: No focal deficit present.     Mental Status: He is oriented to person, place, and time.     LABS:   CBC Latest Ref Rng & Units 08/20/2020 08/06/2020 07/30/2020  WBC - 1.5 3.1(L) 1.2(L)  Hemoglobin 13.5 - 17.5 10.8(A) 11.4(L) 11.2(L)  Hematocrit 41 - 53 33(A) 36.5(L) 35.1(L)  Platelets 150 - 399 85(A) 287 173   CMP Latest Ref Rng & Units 08/20/2020 08/06/2020 07/30/2020  Glucose 70 - 99 mg/dL - 84 90  BUN 4 - 21 8 8 9   Creatinine 0.6 - 1.3 0.7 0.90 0.78  Sodium 137 - 147 142 140 143  Potassium 3.4 - 5.3 3.8 3.7 4.3  Chloride 99 - 108 109(A) 107 105  CO2 13 - 22 25(A) 28 25  Calcium 8.9 - 10.3 mg/dL - 8.9 9.9  Total Protein 6.5 - 8.1 g/dL - 6.7 7.3  Total Bilirubin 0.3 - 1.2 mg/dL - 0.6 0.6  Alkaline Phos 38 - 126 U/L - 60 62  AST 15 - 41 U/L - 14(L) 14(L)  ALT 0 - 44 U/L - 9 10    ASSESSMENT & PLAN:   Assessment/Plan:  A 64 y.o.  male with limited stage small cell lung cancer.  He will proceed with his 4th and final cycle of cisplatin/etoposide next week.  Hopefully, by then, his counts will have recovered to where he can proceed with treatment at that time.  Overall, he has done well with his chemoradiation.  I reassured him that his dysphagia, likely due to radiation esophagitis, will continue to improve the further he gets out from his radiation.  I will see him back in early January 2022 for repeat clinical assessment.  A repeat chest CT will be done before his next visit to ascertain his new disease baseline after the completion of all of his definitive  chemoradiation.  The patient understands all the plans discussed today and is in agreement with them.      Robel Wuertz Macarthur Critchley, MD

## 2020-08-20 ENCOUNTER — Inpatient Hospital Stay: Payer: 59 | Admitting: Hematology and Oncology

## 2020-08-20 ENCOUNTER — Inpatient Hospital Stay (INDEPENDENT_AMBULATORY_CARE_PROVIDER_SITE_OTHER): Payer: 59 | Admitting: Oncology

## 2020-08-20 ENCOUNTER — Telehealth: Payer: Self-pay | Admitting: Oncology

## 2020-08-20 ENCOUNTER — Encounter: Payer: Self-pay | Admitting: Oncology

## 2020-08-20 ENCOUNTER — Other Ambulatory Visit: Payer: Self-pay

## 2020-08-20 ENCOUNTER — Other Ambulatory Visit: Payer: Self-pay | Admitting: Oncology

## 2020-08-20 VITALS — BP 148/81 | HR 74 | Temp 98.3°F | Resp 16 | Ht 67.0 in | Wt 120.7 lb

## 2020-08-20 DIAGNOSIS — C383 Malignant neoplasm of mediastinum, part unspecified: Secondary | ICD-10-CM | POA: Diagnosis not present

## 2020-08-20 LAB — CBC AND DIFFERENTIAL
HCT: 33 — AB (ref 41–53)
Hemoglobin: 10.8 — AB (ref 13.5–17.5)
Neutrophils Absolute: 0.98
Platelets: 85 — AB (ref 150–399)
WBC: 1.5

## 2020-08-20 LAB — BASIC METABOLIC PANEL
BUN: 8 (ref 4–21)
CO2: 25 — AB (ref 13–22)
Chloride: 109 — AB (ref 99–108)
Creatinine: 0.7 (ref 0.6–1.3)
Potassium: 3.8 (ref 3.4–5.3)
Sodium: 142 (ref 137–147)

## 2020-08-20 LAB — CBC: RBC: 3.94 (ref 3.87–5.11)

## 2020-08-20 NOTE — Telephone Encounter (Signed)
Per Dr Bobby Rumpf, scheduled patient for 11/29 Lab Work - Move Chemo out 1 day. Gave patient revised Patient Summary

## 2020-08-20 NOTE — Progress Notes (Signed)
PT STABLE AT TIME OF DISCHARGE 

## 2020-08-21 ENCOUNTER — Ambulatory Visit: Payer: Medicaid Other

## 2020-08-22 ENCOUNTER — Ambulatory Visit: Payer: Medicaid Other

## 2020-08-27 ENCOUNTER — Other Ambulatory Visit: Payer: Self-pay | Admitting: Pharmacist

## 2020-08-27 ENCOUNTER — Inpatient Hospital Stay: Payer: 59 | Admitting: Hematology and Oncology

## 2020-08-27 ENCOUNTER — Other Ambulatory Visit: Payer: Self-pay

## 2020-08-27 ENCOUNTER — Ambulatory Visit: Payer: Medicaid Other

## 2020-08-27 ENCOUNTER — Other Ambulatory Visit: Payer: Self-pay | Admitting: Oncology

## 2020-08-27 DIAGNOSIS — C383 Malignant neoplasm of mediastinum, part unspecified: Secondary | ICD-10-CM

## 2020-08-27 DIAGNOSIS — D701 Agranulocytosis secondary to cancer chemotherapy: Secondary | ICD-10-CM

## 2020-08-27 LAB — BASIC METABOLIC PANEL
BUN: 9 (ref 4–21)
CO2: 28 — AB (ref 13–22)
Chloride: 106 (ref 99–108)
Creatinine: 0.8 (ref 0.6–1.3)
Glucose: 103
Potassium: 3.9 (ref 3.4–5.3)
Sodium: 140 (ref 137–147)

## 2020-08-27 LAB — HEPATIC FUNCTION PANEL
ALT: 10 (ref 10–40)
AST: 22 (ref 14–40)
Alkaline Phosphatase: 54 (ref 25–125)
Bilirubin, Total: 0.4

## 2020-08-27 LAB — CBC AND DIFFERENTIAL
HCT: 35 — AB (ref 41–53)
Hemoglobin: 11.2 — AB (ref 13.5–17.5)
Neutrophils Absolute: 0.18
Platelets: 223 (ref 150–399)
WBC: 1.2

## 2020-08-27 LAB — COMPREHENSIVE METABOLIC PANEL
Albumin: 4.3 (ref 3.5–5.0)
Calcium: 9.5 (ref 8.7–10.7)

## 2020-08-27 LAB — CBC: RBC: 4.09 (ref 3.87–5.11)

## 2020-08-28 ENCOUNTER — Inpatient Hospital Stay: Payer: 59

## 2020-08-28 ENCOUNTER — Ambulatory Visit: Payer: Medicaid Other

## 2020-08-29 ENCOUNTER — Ambulatory Visit: Payer: Medicaid Other

## 2020-08-29 ENCOUNTER — Inpatient Hospital Stay: Payer: 59

## 2020-08-29 ENCOUNTER — Other Ambulatory Visit: Payer: Self-pay | Admitting: Pharmacist

## 2020-08-30 ENCOUNTER — Ambulatory Visit: Payer: 59

## 2020-08-31 ENCOUNTER — Ambulatory Visit: Payer: 59

## 2020-09-03 ENCOUNTER — Inpatient Hospital Stay: Payer: 59 | Attending: Oncology

## 2020-09-03 ENCOUNTER — Other Ambulatory Visit: Payer: Self-pay | Admitting: Hematology and Oncology

## 2020-09-03 DIAGNOSIS — Z5111 Encounter for antineoplastic chemotherapy: Secondary | ICD-10-CM | POA: Insufficient documentation

## 2020-09-03 DIAGNOSIS — C381 Malignant neoplasm of anterior mediastinum: Secondary | ICD-10-CM | POA: Insufficient documentation

## 2020-09-03 DIAGNOSIS — Z23 Encounter for immunization: Secondary | ICD-10-CM | POA: Insufficient documentation

## 2020-09-03 LAB — BASIC METABOLIC PANEL
BUN: 8 (ref 4–21)
CO2: 26 — AB (ref 13–22)
Chloride: 106 (ref 99–108)
Creatinine: 0.8 (ref 0.6–1.3)
Glucose: 82
Potassium: 4.1 (ref 3.4–5.3)
Sodium: 139 (ref 137–147)

## 2020-09-03 LAB — HEPATIC FUNCTION PANEL
ALT: 11 (ref 10–40)
AST: 21 (ref 14–40)
Alkaline Phosphatase: 60 (ref 25–125)
Bilirubin, Total: 0.4

## 2020-09-03 LAB — CBC AND DIFFERENTIAL
HCT: 36 — AB (ref 41–53)
Hemoglobin: 11.8 — AB (ref 13.5–17.5)
Neutrophils Absolute: 2.07
Platelets: 245 (ref 150–399)
WBC: 3.5

## 2020-09-03 LAB — COMPREHENSIVE METABOLIC PANEL
Albumin: 4.2 (ref 3.5–5.0)
Calcium: 9.7 (ref 8.7–10.7)

## 2020-09-03 LAB — CBC: RBC: 4.22 (ref 3.87–5.11)

## 2020-09-04 ENCOUNTER — Other Ambulatory Visit: Payer: Self-pay

## 2020-09-04 ENCOUNTER — Inpatient Hospital Stay: Payer: 59

## 2020-09-04 DIAGNOSIS — C381 Malignant neoplasm of anterior mediastinum: Secondary | ICD-10-CM | POA: Diagnosis present

## 2020-09-04 DIAGNOSIS — Z5111 Encounter for antineoplastic chemotherapy: Secondary | ICD-10-CM | POA: Diagnosis present

## 2020-09-04 DIAGNOSIS — C383 Malignant neoplasm of mediastinum, part unspecified: Secondary | ICD-10-CM

## 2020-09-04 DIAGNOSIS — Z23 Encounter for immunization: Secondary | ICD-10-CM | POA: Diagnosis not present

## 2020-09-04 MED ORDER — HEPARIN SOD (PORK) LOCK FLUSH 100 UNIT/ML IV SOLN
500.0000 [IU] | Freq: Once | INTRAVENOUS | Status: AC | PRN
  Administered 2020-09-04: 500 [IU]
  Filled 2020-09-04: qty 5

## 2020-09-04 MED ORDER — PALONOSETRON HCL INJECTION 0.25 MG/5ML
INTRAVENOUS | Status: AC
  Filled 2020-09-04: qty 5

## 2020-09-04 MED ORDER — SODIUM CHLORIDE 0.9 % IV SOLN
Freq: Once | INTRAVENOUS | Status: AC
  Filled 2020-09-04: qty 10

## 2020-09-04 MED ORDER — SODIUM CHLORIDE 0.9 % IV SOLN
75.0000 mg/m2 | Freq: Once | INTRAVENOUS | Status: AC
  Administered 2020-09-04: 121 mg via INTRAVENOUS
  Filled 2020-09-04: qty 100

## 2020-09-04 MED ORDER — PALONOSETRON HCL INJECTION 0.25 MG/5ML
0.2500 mg | Freq: Once | INTRAVENOUS | Status: AC
  Administered 2020-09-04: 0.25 mg via INTRAVENOUS

## 2020-09-04 MED ORDER — SODIUM CHLORIDE 0.9 % IV SOLN
150.0000 mg | Freq: Once | INTRAVENOUS | Status: AC
  Administered 2020-09-04: 150 mg via INTRAVENOUS
  Filled 2020-09-04: qty 150

## 2020-09-04 MED ORDER — SODIUM CHLORIDE 0.9 % IV SOLN
Freq: Once | INTRAVENOUS | Status: AC
  Filled 2020-09-04: qty 250

## 2020-09-04 MED ORDER — SODIUM CHLORIDE 0.9 % IV SOLN
10.0000 mg | Freq: Once | INTRAVENOUS | Status: AC
  Administered 2020-09-04: 10 mg via INTRAVENOUS
  Filled 2020-09-04: qty 10

## 2020-09-04 MED ORDER — SODIUM CHLORIDE 0.9 % IV SOLN
100.0000 mg/m2 | Freq: Once | INTRAVENOUS | Status: AC
  Administered 2020-09-04: 160 mg via INTRAVENOUS
  Filled 2020-09-04: qty 8

## 2020-09-04 NOTE — Patient Instructions (Signed)
Etoposide, VP-16 injection What is this medicine? ETOPOSIDE, VP-16 (e toe POE side) is a chemotherapy drug. It is used to treat testicular cancer, lung cancer, and other cancers. This medicine may be used for other purposes; ask your health care provider or pharmacist if you have questions. COMMON BRAND NAME(S): Etopophos, Toposar, VePesid What should I tell my health care provider before I take this medicine? They need to know if you have any of these conditions:  infection  kidney disease  liver disease  low blood counts, like low white cell, platelet, or red cell counts  an unusual or allergic reaction to etoposide, other medicines, foods, dyes, or preservatives  pregnant or trying to get pregnant  breast-feeding How should I use this medicine? This medicine is for infusion into a vein. It is administered in a hospital or clinic by a specially trained health care professional. Talk to your pediatrician regarding the use of this medicine in children. Special care may be needed. Overdosage: If you think you have taken too much of this medicine contact a poison control center or emergency room at once. NOTE: This medicine is only for you. Do not share this medicine with others. What if I miss a dose? It is important not to miss your dose. Call your doctor or health care professional if you are unable to keep an appointment. What may interact with this medicine? This medicine may interact with the following medications:  warfarin This list may not describe all possible interactions. Give your health care provider a list of all the medicines, herbs, non-prescription drugs, or dietary supplements you use. Also tell them if you smoke, drink alcohol, or use illegal drugs. Some items may interact with your medicine. What should I watch for while using this medicine? Visit your doctor for checks on your progress. This drug may make you feel generally unwell. This is not uncommon, as  chemotherapy can affect healthy cells as well as cancer cells. Report any side effects. Continue your course of treatment even though you feel ill unless your doctor tells you to stop. In some cases, you may be given additional medicines to help with side effects. Follow all directions for their use. Call your doctor or health care professional for advice if you get a fever, chills or sore throat, or other symptoms of a cold or flu. Do not treat yourself. This drug decreases your body's ability to fight infections. Try to avoid being around people who are sick. This medicine may increase your risk to bruise or bleed. Call your doctor or health care professional if you notice any unusual bleeding. Talk to your doctor about your risk of cancer. You may be more at risk for certain types of cancers if you take this medicine. Do not become pregnant while taking this medicine or for at least 6 months after stopping it. Women should inform their doctor if they wish to become pregnant or think they might be pregnant. Women of child-bearing potential will need to have a negative pregnancy test before starting this medicine. There is a potential for serious side effects to an unborn child. Talk to your health care professional or pharmacist for more information. Do not breast-feed an infant while taking this medicine. Men must use a latex condom during sexual contact with a woman while taking this medicine and for at least 4 months after stopping it. A latex condom is needed even if you have had a vasectomy. Contact your doctor right away if your partner  becomes pregnant. Do not donate sperm while taking this medicine and for at least 4 months after you stop taking this medicine. Men should inform their doctors if they wish to father a child. This medicine may lower sperm counts. What side effects may I notice from receiving this medicine? Side effects that you should report to your doctor or health care professional  as soon as possible:  allergic reactions like skin rash, itching or hives, swelling of the face, lips, or tongue  low blood counts - this medicine may decrease the number of white blood cells, red blood cells, and platelets. You may be at increased risk for infections and bleeding  nausea, vomiting  redness, blistering, peeling or loosening of the skin, including inside the mouth  signs and symptoms of infection like fever; chills; cough; sore throat; pain or trouble passing urine  signs and symptoms of low red blood cells or anemia such as unusually weak or tired; feeling faint or lightheaded; falls; breathing problems  unusual bruising or bleeding Side effects that usually do not require medical attention (report to your doctor or health care professional if they continue or are bothersome):  changes in taste  diarrhea  hair loss  loss of appetite  mouth sores This list may not describe all possible side effects. Call your doctor for medical advice about side effects. You may report side effects to FDA at 1-800-FDA-1088. Where should I keep my medicine? This drug is given in a hospital or clinic and will not be stored at home. NOTE: This sheet is a summary. It may not cover all possible information. If you have questions about this medicine, talk to your doctor, pharmacist, or health care provider.  2020 Elsevier/Gold Standard (2018-11-10 16:57:15) Cisplatin injection What is this medicine? CISPLATIN (SIS pla tin) is a chemotherapy drug. It targets fast dividing cells, like cancer cells, and causes these cells to die. This medicine is used to treat many types of cancer like bladder, ovarian, and testicular cancers. This medicine may be used for other purposes; ask your health care provider or pharmacist if you have questions. COMMON BRAND NAME(S): Platinol, Platinol -AQ What should I tell my health care provider before I take this medicine? They need to know if you have any of  these conditions:  eye disease, vision problems  hearing problems  kidney disease  low blood counts, like white cells, platelets, or red blood cells  tingling of the fingers or toes, or other nerve disorder  an unusual or allergic reaction to cisplatin, carboplatin, oxaliplatin, other medicines, foods, dyes, or preservatives  pregnant or trying to get pregnant  breast-feeding How should I use this medicine? This drug is given as an infusion into a vein. It is administered in a hospital or clinic by a specially trained health care professional. Talk to your pediatrician regarding the use of this medicine in children. Special care may be needed. Overdosage: If you think you have taken too much of this medicine contact a poison control center or emergency room at once. NOTE: This medicine is only for you. Do not share this medicine with others. What if I miss a dose? It is important not to miss a dose. Call your doctor or health care professional if you are unable to keep an appointment. What may interact with this medicine? This medicine may interact with the following medications:  foscarnet  certain antibiotics like amikacin, gentamicin, neomycin, polymyxin B, streptomycin, tobramycin, vancomycin This list may not describe all possible  interactions. Give your health care provider a list of all the medicines, herbs, non-prescription drugs, or dietary supplements you use. Also tell them if you smoke, drink alcohol, or use illegal drugs. Some items may interact with your medicine. What should I watch for while using this medicine? Your condition will be monitored carefully while you are receiving this medicine. You will need important blood work done while you are taking this medicine. This drug may make you feel generally unwell. This is not uncommon, as chemotherapy can affect healthy cells as well as cancer cells. Report any side effects. Continue your course of treatment even though  you feel ill unless your doctor tells you to stop. This medicine may increase your risk of getting an infection. Call your healthcare professional for advice if you get a fever, chills, or sore throat, or other symptoms of a cold or flu. Do not treat yourself. Try to avoid being around people who are sick. Avoid taking medicines that contain aspirin, acetaminophen, ibuprofen, naproxen, or ketoprofen unless instructed by your healthcare professional. These medicines may hide a fever. This medicine may increase your risk to bruise or bleed. Call your doctor or health care professional if you notice any unusual bleeding. Be careful brushing and flossing your teeth or using a toothpick because you may get an infection or bleed more easily. If you have any dental work done, tell your dentist you are receiving this medicine. Do not become pregnant while taking this medicine or for 14 months after stopping it. Women should inform their healthcare professional if they wish to become pregnant or think they might be pregnant. Men should not father a child while taking this medicine and for 11 months after stopping it. There is potential for serious side effects to an unborn child. Talk to your healthcare professional for more information. Do not breast-feed an infant while taking this medicine. This medicine has caused ovarian failure in some women. This medicine may make it more difficult to get pregnant. Talk to your healthcare professional if you are concerned about your fertility. This medicine has caused decreased sperm counts in some men. This may make it more difficult to father a child. Talk to your healthcare professional if you are concerned about your fertility. Drink fluids as directed while you are taking this medicine. This will help protect your kidneys. Call your doctor or health care professional if you get diarrhea. Do not treat yourself. What side effects may I notice from receiving this  medicine? Side effects that you should report to your doctor or health care professional as soon as possible:  allergic reactions like skin rash, itching or hives, swelling of the face, lips, or tongue  blurred vision  changes in vision  decreased hearing or ringing of the ears  nausea, vomiting  pain, redness, or irritation at site where injected  pain, tingling, numbness in the hands or feet  signs and symptoms of bleeding such as bloody or black, tarry stools; red or dark brown urine; spitting up blood or brown material that looks like coffee grounds; red spots on the skin; unusual bruising or bleeding from the eyes, gums, or nose  signs and symptoms of infection like fever; chills; cough; sore throat; pain or trouble passing urine  signs and symptoms of kidney injury like trouble passing urine or change in the amount of urine  signs and symptoms of low red blood cells or anemia such as unusually weak or tired; feeling faint or lightheaded; falls; breathing  problems Side effects that usually do not require medical attention (report to your doctor or health care professional if they continue or are bothersome):  loss of appetite  mouth sores  muscle cramps This list may not describe all possible side effects. Call your doctor for medical advice about side effects. You may report side effects to FDA at 1-800-FDA-1088. Where should I keep my medicine? This drug is given in a hospital or clinic and will not be stored at home. NOTE: This sheet is a summary. It may not cover all possible information. If you have questions about this medicine, talk to your doctor, pharmacist, or health care provider.  2020 Elsevier/Gold Standard (2018-09-10 15:59:17)

## 2020-09-04 NOTE — Progress Notes (Signed)
PT STABLE AT TIME OF DISCHARGE 

## 2020-09-05 ENCOUNTER — Inpatient Hospital Stay: Payer: 59

## 2020-09-05 VITALS — BP 153/89 | HR 79 | Temp 97.9°F | Resp 18 | Ht 67.0 in | Wt 127.2 lb

## 2020-09-05 DIAGNOSIS — Z5111 Encounter for antineoplastic chemotherapy: Secondary | ICD-10-CM | POA: Diagnosis not present

## 2020-09-05 DIAGNOSIS — C383 Malignant neoplasm of mediastinum, part unspecified: Secondary | ICD-10-CM

## 2020-09-05 MED ORDER — HEPARIN SOD (PORK) LOCK FLUSH 100 UNIT/ML IV SOLN
500.0000 [IU] | Freq: Once | INTRAVENOUS | Status: AC | PRN
  Administered 2020-09-05: 500 [IU]
  Filled 2020-09-05: qty 5

## 2020-09-05 MED ORDER — SODIUM CHLORIDE 0.9 % IV SOLN
100.0000 mg/m2 | Freq: Once | INTRAVENOUS | Status: AC
  Administered 2020-09-05: 160 mg via INTRAVENOUS
  Filled 2020-09-05: qty 8

## 2020-09-05 MED ORDER — SODIUM CHLORIDE 0.9 % IV SOLN
10.0000 mg | Freq: Once | INTRAVENOUS | Status: AC
  Administered 2020-09-05: 10 mg via INTRAVENOUS
  Filled 2020-09-05: qty 10

## 2020-09-05 MED ORDER — SODIUM CHLORIDE 0.9 % IV SOLN
Freq: Once | INTRAVENOUS | Status: AC
  Filled 2020-09-05: qty 250

## 2020-09-05 NOTE — Patient Instructions (Signed)
Etoposide, VP-16 injection What is this medicine? ETOPOSIDE, VP-16 (e toe POE side) is a chemotherapy drug. It is used to treat testicular cancer, lung cancer, and other cancers. This medicine may be used for other purposes; ask your health care provider or pharmacist if you have questions. COMMON BRAND NAME(S): Etopophos, Toposar, VePesid What should I tell my health care provider before I take this medicine? They need to know if you have any of these conditions:  infection  kidney disease  liver disease  low blood counts, like low white cell, platelet, or red cell counts  an unusual or allergic reaction to etoposide, other medicines, foods, dyes, or preservatives  pregnant or trying to get pregnant  breast-feeding How should I use this medicine? This medicine is for infusion into a vein. It is administered in a hospital or clinic by a specially trained health care professional. Talk to your pediatrician regarding the use of this medicine in children. Special care may be needed. Overdosage: If you think you have taken too much of this medicine contact a poison control center or emergency room at once. NOTE: This medicine is only for you. Do not share this medicine with others. What if I miss a dose? It is important not to miss your dose. Call your doctor or health care professional if you are unable to keep an appointment. What may interact with this medicine? This medicine may interact with the following medications:  warfarin This list may not describe all possible interactions. Give your health care provider a list of all the medicines, herbs, non-prescription drugs, or dietary supplements you use. Also tell them if you smoke, drink alcohol, or use illegal drugs. Some items may interact with your medicine. What should I watch for while using this medicine? Visit your doctor for checks on your progress. This drug may make you feel generally unwell. This is not uncommon, as  chemotherapy can affect healthy cells as well as cancer cells. Report any side effects. Continue your course of treatment even though you feel ill unless your doctor tells you to stop. In some cases, you may be given additional medicines to help with side effects. Follow all directions for their use. Call your doctor or health care professional for advice if you get a fever, chills or sore throat, or other symptoms of a cold or flu. Do not treat yourself. This drug decreases your body's ability to fight infections. Try to avoid being around people who are sick. This medicine may increase your risk to bruise or bleed. Call your doctor or health care professional if you notice any unusual bleeding. Talk to your doctor about your risk of cancer. You may be more at risk for certain types of cancers if you take this medicine. Do not become pregnant while taking this medicine or for at least 6 months after stopping it. Women should inform their doctor if they wish to become pregnant or think they might be pregnant. Women of child-bearing potential will need to have a negative pregnancy test before starting this medicine. There is a potential for serious side effects to an unborn child. Talk to your health care professional or pharmacist for more information. Do not breast-feed an infant while taking this medicine. Men must use a latex condom during sexual contact with a woman while taking this medicine and for at least 4 months after stopping it. A latex condom is needed even if you have had a vasectomy. Contact your doctor right away if your partner  becomes pregnant. Do not donate sperm while taking this medicine and for at least 4 months after you stop taking this medicine. Men should inform their doctors if they wish to father a child. This medicine may lower sperm counts. What side effects may I notice from receiving this medicine? Side effects that you should report to your doctor or health care professional  as soon as possible:  allergic reactions like skin rash, itching or hives, swelling of the face, lips, or tongue  low blood counts - this medicine may decrease the number of white blood cells, red blood cells, and platelets. You may be at increased risk for infections and bleeding  nausea, vomiting  redness, blistering, peeling or loosening of the skin, including inside the mouth  signs and symptoms of infection like fever; chills; cough; sore throat; pain or trouble passing urine  signs and symptoms of low red blood cells or anemia such as unusually weak or tired; feeling faint or lightheaded; falls; breathing problems  unusual bruising or bleeding Side effects that usually do not require medical attention (report to your doctor or health care professional if they continue or are bothersome):  changes in taste  diarrhea  hair loss  loss of appetite  mouth sores This list may not describe all possible side effects. Call your doctor for medical advice about side effects. You may report side effects to FDA at 1-800-FDA-1088. Where should I keep my medicine? This drug is given in a hospital or clinic and will not be stored at home. NOTE: This sheet is a summary. It may not cover all possible information. If you have questions about this medicine, talk to your doctor, pharmacist, or health care provider.  2020 Elsevier/Gold Standard (2018-11-10 16:57:15)

## 2020-09-05 NOTE — Progress Notes (Signed)
PT STABLE AT TIME OF DISCHARGE 

## 2020-09-06 ENCOUNTER — Inpatient Hospital Stay: Payer: 59

## 2020-09-06 ENCOUNTER — Other Ambulatory Visit: Payer: Self-pay

## 2020-09-06 VITALS — BP 167/89 | HR 70 | Temp 98.1°F | Resp 18 | Ht 67.0 in | Wt 127.2 lb

## 2020-09-06 DIAGNOSIS — C383 Malignant neoplasm of mediastinum, part unspecified: Secondary | ICD-10-CM

## 2020-09-06 DIAGNOSIS — Z5111 Encounter for antineoplastic chemotherapy: Secondary | ICD-10-CM | POA: Diagnosis not present

## 2020-09-06 MED ORDER — HEPARIN SOD (PORK) LOCK FLUSH 100 UNIT/ML IV SOLN
500.0000 [IU] | Freq: Once | INTRAVENOUS | Status: AC | PRN
  Administered 2020-09-06: 500 [IU]
  Filled 2020-09-06: qty 5

## 2020-09-06 MED ORDER — SODIUM CHLORIDE 0.9 % IV SOLN
100.0000 mg/m2 | Freq: Once | INTRAVENOUS | Status: AC
  Administered 2020-09-06: 160 mg via INTRAVENOUS
  Filled 2020-09-06: qty 8

## 2020-09-06 MED ORDER — SODIUM CHLORIDE 0.9 % IV SOLN
10.0000 mg | Freq: Once | INTRAVENOUS | Status: AC
  Administered 2020-09-06: 10 mg via INTRAVENOUS
  Filled 2020-09-06: qty 10

## 2020-09-06 MED ORDER — SODIUM CHLORIDE 0.9 % IV SOLN
Freq: Once | INTRAVENOUS | Status: AC
  Filled 2020-09-06: qty 250

## 2020-09-06 NOTE — Progress Notes (Signed)
PT STABLE AT TIME OF DISCHARGE 

## 2020-09-06 NOTE — Patient Instructions (Signed)
Etoposide, VP-16 injection What is this medicine? ETOPOSIDE, VP-16 (e toe POE side) is a chemotherapy drug. It is used to treat testicular cancer, lung cancer, and other cancers. This medicine may be used for other purposes; ask your health care provider or pharmacist if you have questions. COMMON BRAND NAME(S): Etopophos, Toposar, VePesid What should I tell my health care provider before I take this medicine? They need to know if you have any of these conditions:  infection  kidney disease  liver disease  low blood counts, like low white cell, platelet, or red cell counts  an unusual or allergic reaction to etoposide, other medicines, foods, dyes, or preservatives  pregnant or trying to get pregnant  breast-feeding How should I use this medicine? This medicine is for infusion into a vein. It is administered in a hospital or clinic by a specially trained health care professional. Talk to your pediatrician regarding the use of this medicine in children. Special care may be needed. Overdosage: If you think you have taken too much of this medicine contact a poison control center or emergency room at once. NOTE: This medicine is only for you. Do not share this medicine with others. What if I miss a dose? It is important not to miss your dose. Call your doctor or health care professional if you are unable to keep an appointment. What may interact with this medicine? This medicine may interact with the following medications:  warfarin This list may not describe all possible interactions. Give your health care provider a list of all the medicines, herbs, non-prescription drugs, or dietary supplements you use. Also tell them if you smoke, drink alcohol, or use illegal drugs. Some items may interact with your medicine. What should I watch for while using this medicine? Visit your doctor for checks on your progress. This drug may make you feel generally unwell. This is not uncommon, as  chemotherapy can affect healthy cells as well as cancer cells. Report any side effects. Continue your course of treatment even though you feel ill unless your doctor tells you to stop. In some cases, you may be given additional medicines to help with side effects. Follow all directions for their use. Call your doctor or health care professional for advice if you get a fever, chills or sore throat, or other symptoms of a cold or flu. Do not treat yourself. This drug decreases your body's ability to fight infections. Try to avoid being around people who are sick. This medicine may increase your risk to bruise or bleed. Call your doctor or health care professional if you notice any unusual bleeding. Talk to your doctor about your risk of cancer. You may be more at risk for certain types of cancers if you take this medicine. Do not become pregnant while taking this medicine or for at least 6 months after stopping it. Women should inform their doctor if they wish to become pregnant or think they might be pregnant. Women of child-bearing potential will need to have a negative pregnancy test before starting this medicine. There is a potential for serious side effects to an unborn child. Talk to your health care professional or pharmacist for more information. Do not breast-feed an infant while taking this medicine. Men must use a latex condom during sexual contact with a woman while taking this medicine and for at least 4 months after stopping it. A latex condom is needed even if you have had a vasectomy. Contact your doctor right away if your partner  becomes pregnant. Do not donate sperm while taking this medicine and for at least 4 months after you stop taking this medicine. Men should inform their doctors if they wish to father a child. This medicine may lower sperm counts. What side effects may I notice from receiving this medicine? Side effects that you should report to your doctor or health care professional  as soon as possible:  allergic reactions like skin rash, itching or hives, swelling of the face, lips, or tongue  low blood counts - this medicine may decrease the number of white blood cells, red blood cells, and platelets. You may be at increased risk for infections and bleeding  nausea, vomiting  redness, blistering, peeling or loosening of the skin, including inside the mouth  signs and symptoms of infection like fever; chills; cough; sore throat; pain or trouble passing urine  signs and symptoms of low red blood cells or anemia such as unusually weak or tired; feeling faint or lightheaded; falls; breathing problems  unusual bruising or bleeding Side effects that usually do not require medical attention (report to your doctor or health care professional if they continue or are bothersome):  changes in taste  diarrhea  hair loss  loss of appetite  mouth sores This list may not describe all possible side effects. Call your doctor for medical advice about side effects. You may report side effects to FDA at 1-800-FDA-1088. Where should I keep my medicine? This drug is given in a hospital or clinic and will not be stored at home. NOTE: This sheet is a summary. It may not cover all possible information. If you have questions about this medicine, talk to your doctor, pharmacist, or health care provider.  2020 Elsevier/Gold Standard (2018-11-10 16:57:15)

## 2020-09-07 ENCOUNTER — Inpatient Hospital Stay: Payer: 59

## 2020-09-07 VITALS — BP 172/87 | HR 61 | Temp 98.6°F | Resp 18

## 2020-09-07 DIAGNOSIS — Z5111 Encounter for antineoplastic chemotherapy: Secondary | ICD-10-CM | POA: Diagnosis not present

## 2020-09-07 DIAGNOSIS — C383 Malignant neoplasm of mediastinum, part unspecified: Secondary | ICD-10-CM

## 2020-09-07 MED ORDER — PEGFILGRASTIM-BMEZ 6 MG/0.6ML ~~LOC~~ SOSY
PREFILLED_SYRINGE | SUBCUTANEOUS | Status: AC
  Filled 2020-09-07: qty 0.6

## 2020-09-07 MED ORDER — PEGFILGRASTIM-BMEZ 6 MG/0.6ML ~~LOC~~ SOSY
6.0000 mg | PREFILLED_SYRINGE | Freq: Once | SUBCUTANEOUS | Status: AC
  Administered 2020-09-07: 6 mg via SUBCUTANEOUS

## 2020-09-07 NOTE — Progress Notes (Signed)
PT STABLE AT TIME OF DISCHARGE 

## 2020-09-07 NOTE — Patient Instructions (Signed)

## 2020-09-20 NOTE — Telephone Encounter (Signed)
Spencer Butler from [facility] called critical WBC of 1.2. Dr. Bobby Rumpf aware

## 2020-09-25 ENCOUNTER — Telehealth: Payer: Self-pay | Admitting: Oncology

## 2020-09-25 NOTE — Telephone Encounter (Signed)
12/28 spoke with patient scans on 1/3@9am  and DV on 1/4@415pm 

## 2020-10-01 NOTE — Progress Notes (Signed)
[facility] Butler  [address] [place],  [place]  [address] [phone]  Clinic Day:  10/02/2020  Referring physician: Ernestene Kiel, MD   HISTORY OF PRESENT ILLNESS:  The patient is a 65 y.o. male with limited stage small cell lung cancer.  He comes in today to go over his CT scans to ascertain his new disease baseline after receiving definitive chemoradiation, which consisted of 4 cycles of cisplatin/etoposide.  The patient claims to be returning to his normal baseline.  The only problem he continues to have is heartburn, which is related to the radiation component of his recent therapy.  He denies having any respiratory problems related to his underlying disease.  Unfortunately, he is still smoking on a daily basis.    PHYSICAL EXAM:  Blood pressure (!) 154/92, pulse 74, temperature 98.2 F (36.8 C), resp. rate 16, height 5\' 7"  (1.702 m), weight 126 lb 1.6 oz (57.2 kg), SpO2 99 %. Wt Readings from Last 3 Encounters:  10/02/20 126 lb 1.6 oz (57.2 kg)  09/06/20 127 lb 4 oz (57.7 kg)  09/05/20 127 lb 4 oz (57.7 kg)   Body mass index is 19.75 kg/m. Performance status (ECOG):  1 Physical Exam Constitutional:      General: He is not in acute distress.    Appearance: He is not ill-appearing.  HENT:     Head: Normocephalic.     Nose: Nose normal.     Mouth/Throat:     Mouth: Mucous membranes are moist.     Pharynx: Oropharynx is clear.  Cardiovascular:     Rate and Rhythm: Normal rate and regular rhythm.     Heart sounds: No murmur heard. No friction rub. No gallop.   Pulmonary:     Effort: Pulmonary effort is normal.     Breath sounds: Normal breath sounds.  Abdominal:     General: Bowel sounds are normal. There is no distension.     Palpations: Abdomen is soft.     Tenderness: There is no abdominal tenderness.  Musculoskeletal:     Cervical back: No rigidity.     Right lower leg: No edema.     Left lower leg: No edema.  Skin:     General: Skin is warm and dry.     Findings: No bruising, erythema or rash.  Neurological:     General: No focal deficit present.     Mental Status: He is oriented to person, place, and time.    SCANS:  His chest CT revealed the following:  FINDINGS: Cardiovascular: Right Port-A-Cath tip in right atrium. Normal heart size, without pericardial effusion. Lad coronary artery calcification. Aortic atherosclerosis. Tortuous thoracic aorta. No central pulmonary embolism, on this non-dedicated study.  Mediastinum/Nodes: No supraclavicular adenopathy. Significant improvement in superior mediastinal soft tissue mass. Residual soft tissue thickening measures on the order of 2.3 x 3.5 cm on 29/2 (just posterior to left subclavian artery). At the site of a 7.2 x 7.2 cm mass on the prior exam.  No hilar adenopathy.  Lungs/Pleura: No pleural fluid. Moderate to marked centrilobular and paraseptal emphysema.  Right lower lobe calcified granuloma of 5 mm on 77/4.  Upper Abdomen: Normal imaged portions of the liver, spleen, stomach, pancreas, gallbladder, adrenal glands, kidneys.  Musculoskeletal: Moderate convex right thoracic spine curvature.  IMPRESSION: 1. Marked response to therapy of superior mediastinal mass. 2. No new or progressive disease. 3. Aortic atherosclerosis (ICD10-I70.0), coronary artery atherosclerosis and emphysema (ICD10-J43.9).  LABS:  ASSESSMENT & PLAN:  Assessment/Plan:  A 65 y.o. male with limited stage small cell lung cancer.  In clinic today, I went over his chest CT images with him, for which he could see the marked reduction in his mediastinal mass which represented his cancer.  Although there is a residual mass, it may all represent scar tissue.  His initial lung mass was very large, measuring 7.2 x 7.2 cm.  Moving forward, this patient will continue to be followed with CT scans to ensure there remains no radiographic evidence of disease recurrence.  Of  note, I did speak with him about considering prophylactic cranial irradiation, which decreases his chances of having disease spread to his brain in the future.  He will potentially consider this.  I will see him back in May 2022 for repeat clinical assessment, with a chest CT being done a day before his next visit for his small cell lung cancer surveillance.  I once again stressed the importance of smoking abstinence to prevent either disease recurrence or a new aerodigestive malignancy from developing in the future.  The patient understands all the plans discussed today and is in agreement with them.      Spencer Aubry Macarthur Critchley, MD

## 2020-10-02 ENCOUNTER — Inpatient Hospital Stay: Payer: Medicaid Other | Attending: Oncology | Admitting: Oncology

## 2020-10-02 ENCOUNTER — Other Ambulatory Visit: Payer: Self-pay

## 2020-10-02 VITALS — BP 154/92 | HR 74 | Temp 98.2°F | Resp 16 | Ht 67.0 in | Wt 126.1 lb

## 2020-10-02 DIAGNOSIS — C3490 Malignant neoplasm of unspecified part of unspecified bronchus or lung: Secondary | ICD-10-CM

## 2020-10-02 DIAGNOSIS — C383 Malignant neoplasm of mediastinum, part unspecified: Secondary | ICD-10-CM

## 2020-10-02 NOTE — Progress Notes (Signed)
Pt states indigestion is no better.  None of the meds Dr. Orlene Erm has given him have helped.

## 2020-10-03 ENCOUNTER — Encounter: Payer: Self-pay | Admitting: Oncology

## 2020-10-03 DIAGNOSIS — C3490 Malignant neoplasm of unspecified part of unspecified bronchus or lung: Secondary | ICD-10-CM | POA: Insufficient documentation

## 2021-01-29 NOTE — Progress Notes (Signed)
[facility]  [address] [place],  [place]  [address] [phone]  Clinic Day:  01/30/2021  Referring physician: Ernestene Kiel, MD   HISTORY OF PRESENT ILLNESS:  The patient is a 65 y.o. male with limited stage small cell lung cancer.  He completed definitive chemoradiation, which consisted of 4 cycles of cisplatin/etoposide in October 2021.  He comes in today to go over his CT scans to reassess the status of his treated disease.  Since his last visit, the patient has been doing well.  He denies having any respiratory problems which concern him for disease recurrence.  Unfortunately, he is still smoking on a daily basis.    PHYSICAL EXAM:  Blood pressure (!) 168/89, pulse 71, temperature 98.6 F (37 C), resp. rate 16, height 5\' 7"  (1.702 m), weight 134 lb 3.2 oz (60.9 kg), SpO2 97 %. Wt Readings from Last 3 Encounters:  01/30/21 134 lb 3.2 oz (60.9 kg)  10/02/20 126 lb 1.6 oz (57.2 kg)  09/06/20 127 lb 4 oz (57.7 kg)   Body mass index is 21.02 kg/m. Performance status (ECOG):  1 Physical Exam Constitutional:      General: He is not in acute distress.    Appearance: He is not ill-appearing.  HENT:     Head: Normocephalic.     Nose: Nose normal.     Mouth/Throat:     Mouth: Mucous membranes are moist.     Pharynx: Oropharynx is clear.  Cardiovascular:     Rate and Rhythm: Normal rate and regular rhythm.     Heart sounds: No murmur heard. No friction rub. No gallop.   Pulmonary:     Effort: Pulmonary effort is normal.     Breath sounds: Normal breath sounds.  Abdominal:     General: Bowel sounds are normal. There is no distension.     Palpations: Abdomen is soft.     Tenderness: There is no abdominal tenderness.  Musculoskeletal:     Cervical back: No rigidity.     Right lower leg: No edema.     Left lower leg: No edema.  Skin:    General: Skin is warm and dry.     Findings: No bruising, erythema or rash.  Neurological:      General: No focal deficit present.     Mental Status: He is oriented to person, place, and time.    SCANS:  His chest CT revealed the following: FINDINGS: Cardiovascular: Right chest port catheter. Scattered aortic atherosclerosis. Normal heart size. Left coronary artery calcifications. No pericardial effusion.  Mediastinum/Nodes: Interval decrease in size of a soft tissue mass in the left aspect of the superior mediastinum adjacent to the trachea, esophagus, and left subclavian and carotid artery origins (series 2, image 34). This measures approximately 3.2 x 2.0 cm, previously 3.5 x 2.3 cm. No discretely enlarged mediastinal, hilar, or axillary lymph nodes. Thyroid gland, trachea, and esophagus demonstrate no significant findings.  Lungs/Pleura: Moderate centrilobular emphysema. No pleural effusion or pneumothorax.  Upper Abdomen: No acute abnormality.  Musculoskeletal: No chest wall mass or suspicious bone lesions identified. Dextroscoliosis of the thoracic spine.  IMPRESSION: 1. Interval decrease in size of a soft tissue mass in the left aspect of the superior mediastinum adjacent to the trachea, esophagus, and left subclavian and carotid artery origins. This measures approximately 3.2 x 2.0 cm, previously 3.5 x 2.3 cm. Findings are consistent with continued treatment response. 2. No discretely enlarged lymph nodes. 3. Emphysema. 4. Coronary  artery disease.  Aortic Atherosclerosis (ICD10-I70.0) and Emphysema (ICD10-J43.9).  LABS:     ASSESSMENT & PLAN:  Assessment/Plan:  A 65 y.o. male with limited stage small cell lung cancer.  In clinic today, I went over his chest CT images with him, for which he could see that there remains no evidence of disease recurrence.  Clinically, the patient continues to do well.  Once again, I expressed to him the importance of smoking abstinence as to prevent new/recurrent lung cancer or a cardiovascular insult from developing.  I will see  him back in another 4 months, with a repeat chest CT being done at that time for his continued radiographic lung cancer surveillance.  The patient understands all the plans discussed today and is in agreement with them.     Karmon Andis Macarthur Critchley, MD

## 2021-01-30 ENCOUNTER — Other Ambulatory Visit: Payer: Self-pay

## 2021-01-30 ENCOUNTER — Other Ambulatory Visit: Payer: Self-pay | Admitting: Oncology

## 2021-01-30 ENCOUNTER — Telehealth: Payer: Self-pay | Admitting: Oncology

## 2021-01-30 ENCOUNTER — Inpatient Hospital Stay: Payer: 59 | Attending: Oncology | Admitting: Oncology

## 2021-01-30 VITALS — BP 168/89 | HR 71 | Temp 98.6°F | Resp 16 | Ht 67.0 in | Wt 134.2 lb

## 2021-01-30 DIAGNOSIS — C3490 Malignant neoplasm of unspecified part of unspecified bronchus or lung: Secondary | ICD-10-CM | POA: Diagnosis not present

## 2021-01-30 NOTE — Telephone Encounter (Signed)
Per 4/5 los next appt scheduled and confirmed with patient

## 2021-02-11 ENCOUNTER — Encounter: Payer: Self-pay | Admitting: Oncology

## 2021-05-10 ENCOUNTER — Encounter: Payer: Self-pay | Admitting: Oncology

## 2021-05-28 NOTE — Progress Notes (Signed)
[facility]  [address] [place],  [place]  [address] [phone]  Clinic Day:  06/05/2021  Referring physician: Ernestene Kiel, MD  This document serves as a record of services personally performed by Marice Potter, MD. It was created on their behalf by [facility] E, a trained medical scribe. The creation of this record is based on the scribe's personal observations and the provider's statements to them.  HISTORY OF PRESENT ILLNESS:  The patient is a 65 y.o. male with limited stage small cell lung cancer.  He completed definitive chemoradiation, which consisted of 4 cycles of cisplatin/etoposide, in October 2021.  He comes in today to go over his CT scans for his continued lung cancer surveillance.  Since his last visit, the patient has been doing well.  He denies having any respiratory problems which concern him for disease recurrence.  Unfortunately, he still smokes on a daily basis.    PHYSICAL EXAM:  Blood pressure (!) 154/98, pulse 77, temperature 98.4 F (36.9 C), resp. rate 16, height 5\' 7"  (1.702 m), weight 147 lb 11.2 oz (67 kg), SpO2 99 %. Wt Readings from Last 3 Encounters:  06/05/21 147 lb 11.2 oz (67 kg)  01/30/21 134 lb 3.2 oz (60.9 kg)  10/02/20 126 lb 1.6 oz (57.2 kg)   Body mass index is 23.13 kg/m. Performance status (ECOG):  0 Physical Exam Constitutional:      General: He is not in acute distress.    Appearance: He is not ill-appearing.  HENT:     Head: Normocephalic.     Nose: Nose normal.     Mouth/Throat:     Mouth: Mucous membranes are moist.     Pharynx: Oropharynx is clear.  Cardiovascular:     Rate and Rhythm: Normal rate and regular rhythm.     Heart sounds: No murmur heard.   No friction rub. No gallop.  Pulmonary:     Effort: Pulmonary effort is normal.     Breath sounds: Normal breath sounds.  Abdominal:     General: Bowel sounds are normal. There is no distension.     Palpations: Abdomen is  soft.     Tenderness: There is no abdominal tenderness.  Musculoskeletal:     Cervical back: No rigidity.     Right lower leg: No edema.     Left lower leg: No edema.  Skin:    General: Skin is warm and dry.     Findings: No bruising, erythema or rash.  Neurological:     General: No focal deficit present.     Mental Status: He is oriented to person, place, and time.   SCANS:  EXAM: CT CHEST WITH CONTRAST 06/04/2021  FINDINGS: Cardiovascular: Heart size is within normal limits. No pericardial effusion. Mild aortic atherosclerosis and coronary artery calcifications  Mediastinum/Nodes: Normal appearance of the thyroid gland. The trachea appears patent and is midline. Normal appearance of the esophagus.  No enlarged supraclavicular or axillary lymph nodes. No mediastinal or hilar adenopathy.  Soft tissue mass in the left side of the superior mediastinum measures 3.1 x 1.8 cm, image 30/2. This is stable when compared with the previous exam.  Lungs/Pleura: No pleural effusion, subpleural reticulation and fibrosis within the paramediastinal left upper lobe is identified compatible with changes secondary to external beam radiation.  Small nonspecific nodule within the subpleural aspect of the medial right upper lobe measures 3 mm, image 67/4. New since the previous exam.  Calcified granuloma identified within  the periphery of the right lower lobe, image 77/4. Unchanged.  Upper Abdomen: No acute abnormality.  Musculoskeletal: Thoracolumbar scoliosis deformity identified. No acute or suspicious osseous findings.  IMPRESSION: 1. Stable appearance of treated tumor within the left side of the superior mediastinum. No new or progressive disease identified. 2. Small nonspecific nodule within the subpleural aspect of the medial right upper lobe measures 3 mm. New since the previous exam. Attention on follow-up imaging advised. 3. Aortic atherosclerosis and coronary artery  calcifications.  Aortic Atherosclerosis (ICD10-I70.0).   LABS:    Ref. Range 06/04/2021 00:00  Sodium Latest Ref Range: 137 - 147  141  Potassium Latest Ref Range: 3.4 - 5.3  4.0  Chloride Latest Ref Range: 99 - 108  109 (A)  CO2 Latest Ref Range: 13 - 22  18  Glucose Unknown 96  BUN Latest Ref Range: 4 - 21  14  Creatinine Latest Ref Range: 0.6 - 1.3  1.1  Calcium Latest Ref Range: 8.7 - 10.7  9.3  Alkaline Phosphatase Latest Ref Range: 25 - 125  75  Albumin Latest Ref Range: 3.5 - 5.0  4.3  AST Latest Ref Range: 14 - 40  28  ALT Latest Ref Range: 10 - 40  21  Bilirubin, Total Unknown 0.4  WBC Unknown 6.3  RBC Latest Ref Range: 3.87 - 5.11  4.82  Hemoglobin Latest Ref Range: 13.5 - 17.5  13.1 (A)  HCT Latest Ref Range: 41 - 53  41  Platelets Latest Ref Range: 150 - 399  252  NEUT# Unknown 3.72    ASSESSMENT & PLAN:  Assessment/Plan:  A 65 y.o. male with limited stage small cell lung cancer.  In clinic today, I went over his chest CT images with him, for which he could see that there remains no obvious evidence of disease recurrence.  I am not convinced the 3 mm lesion seen in his medial right upper lobe represents disease recurrence.  Nevertheless, it will continue to be followed.  Clinically, the patient continues to do well.  Once again, I expressed to him the importance of smoking abstinence as to prevent new/recurrent lung cancer or a cardiovascular insult from developing.  I will see him back in another 4 months, with a repeat chest CT being done at that time for his continued radiographic lung cancer surveillance.  The patient understands all the plans discussed today and is in agreement with them.     I, Rita Ohara, am acting as scribe for Marice Potter, MD    I have reviewed this report as typed by the medical scribe, and it is complete and accurate.  Dequincy Macarthur Critchley, MD

## 2021-06-04 ENCOUNTER — Encounter: Payer: Self-pay | Admitting: Oncology

## 2021-06-04 LAB — CBC AND DIFFERENTIAL
HCT: 41 (ref 41–53)
Hemoglobin: 13.1 — AB (ref 13.5–17.5)
Neutrophils Absolute: 3.72
Platelets: 252 (ref 150–399)
WBC: 6.3

## 2021-06-04 LAB — HEPATIC FUNCTION PANEL
ALT: 21 (ref 10–40)
AST: 28 (ref 14–40)
Alkaline Phosphatase: 75 (ref 25–125)
Bilirubin, Total: 0.4

## 2021-06-04 LAB — COMPREHENSIVE METABOLIC PANEL
Albumin: 4.3 (ref 3.5–5.0)
Calcium: 9.3 (ref 8.7–10.7)

## 2021-06-04 LAB — BASIC METABOLIC PANEL
BUN: 14 (ref 4–21)
CO2: 18 (ref 13–22)
Chloride: 109 — AB (ref 99–108)
Creatinine: 1.1 (ref 0.6–1.3)
Glucose: 96
Potassium: 4 (ref 3.4–5.3)
Sodium: 141 (ref 137–147)

## 2021-06-04 LAB — CBC: RBC: 4.82 (ref 3.87–5.11)

## 2021-06-05 ENCOUNTER — Inpatient Hospital Stay: Payer: Medicaid Other | Attending: Oncology | Admitting: Oncology

## 2021-06-05 ENCOUNTER — Other Ambulatory Visit: Payer: Self-pay | Admitting: Oncology

## 2021-06-05 ENCOUNTER — Telehealth: Payer: Self-pay | Admitting: Oncology

## 2021-06-05 VITALS — BP 154/98 | HR 77 | Temp 98.4°F | Resp 16 | Ht 67.0 in | Wt 147.7 lb

## 2021-06-05 DIAGNOSIS — C3490 Malignant neoplasm of unspecified part of unspecified bronchus or lung: Secondary | ICD-10-CM

## 2021-06-05 NOTE — Telephone Encounter (Signed)
Per 9/7 LOS, patient scheduled for Jan 2023 Follow Up - Gave patient Appt Summary  Sending order to Scheduling for Jan Labs/CT Scan

## 2021-06-11 ENCOUNTER — Encounter: Payer: Self-pay | Admitting: Oncology

## 2021-07-01 ENCOUNTER — Encounter: Payer: Self-pay | Admitting: Oncology

## 2021-07-02 ENCOUNTER — Encounter: Payer: Self-pay | Admitting: Oncology

## 2021-09-11 ENCOUNTER — Other Ambulatory Visit: Payer: Self-pay

## 2021-10-04 NOTE — Progress Notes (Signed)
[facility]  [address] [place],  [place]  [address] [phone]  Clinic Day:  10/08/2021  Referring physician: Antionette Fairy, PA-C  This document serves as a record of services personally performed by Marice Potter, MD. It was created on their behalf by Curry,Lauren E, a trained medical scribe. The creation of this record is based on the scribe's personal observations and the provider's statements to them.  HISTORY OF PRESENT ILLNESS:  The patient is a 66 y.o. male with limited stage small cell lung cancer.  He completed definitive chemoradiation, which consisted of 4 cycles of cisplatin/etoposide, in October 2021.  Unfortunately, a brain MRI showed a large, isolated brain lesion in her left frontoparietal lobe, which has led to right-sided neurological deficits.  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  Over these past weeks, he has been receiving Decadron for his brain lesion.  Of note, his initial brain MRI did not show any significant vasogenic edema.  He comes in today to go over his chest CT for his continued lung cancer surveillance.  Since his last visit, the patient has been doing okay.  He reports his taste has been altered.  He feels weak as well.  It has been more of an ordeal lately due to his right-sided neurological deficits.  He denies having any respiratory problems which concern him for disease recurrence.  Unfortunately, he still smokes.    PHYSICAL EXAM:  Blood pressure (!) 163/113, pulse (!) 127, temperature 98.3 F (36.8 C), resp. rate 16, height 5\' 7"  (1.702 m), weight 155 lb 4.8 oz (70.4 kg), SpO2 98 %. Wt Readings from Last 3 Encounters:  10/08/21 155 lb 4.8 oz (70.4 kg)  06/05/21 147 lb 11.2 oz (67 kg)  01/30/21 134 lb 3.2 oz (60.9 kg)   Body mass index is 24.32 kg/m. Performance  status (ECOG):  0 Physical Exam Constitutional:      General: He is not in acute distress.    Appearance: He is not ill-appearing.  HENT:     Head: Normocephalic.     Nose: Nose normal.     Mouth/Throat:     Mouth: Mucous membranes are moist.     Pharynx: Oropharynx is clear.  Cardiovascular:     Rate and Rhythm: Regular rhythm. Tachycardia present.     Heart sounds: No murmur heard.   No friction rub. No gallop.  Pulmonary:     Effort: Pulmonary effort is normal.     Breath sounds: Normal breath sounds.  Abdominal:     General: Bowel sounds are normal. There is no distension.     Palpations: Abdomen is soft.     Tenderness: There is no abdominal tenderness.  Musculoskeletal:     Cervical back: No rigidity.     Right lower leg: No edema.     Left lower leg: No edema.  Skin:    General: Skin is warm and dry.     Findings: No bruising, erythema or rash.  Neurological:     General: No focal deficit present.     Mental Status: He is oriented to person, place, and time.   SCANS:  Recent CT chest has revealed the following:  FINDINGS:  Cardiovascular: Normal heart size. No pericardial effusion. No  suspicious filling defects of the central pulmonary arteries.  Atherosclerotic disease of  the thoracic aorta. Mild calcifications  of the LAD. Right chest wall port with tip in the lower SVC.  Mediastinum/Nodes: Focally increased soft tissue of the upper  mediastinum located adjacent to the esophagus and superior to the  aortic arch measures approximately 2.5 x 1.2 cm on series 2, image  34, previously 3.1 x 1.8 cm. No pathologically enlarged lymph nodes  seen in the chest.  Lungs/Pleura: Central airways are patent. Centrilobular emphysema.  New solid pulmonary nodule the left upper lobe measuring 6 mm in  mean diameter on series 4, image 80. Additional previously seen  small pulmonary nodules are stable.  Upper Abdomen: No acute abnormality.  Musculoskeletal: No chest wall  abnormality. No acute or significant  osseous findings.   IMPRESSION:  1. New 6 mm solid pulmonary nodule of the left upper lobe,  concerning for progressive neoplasm. Recommend short-term follow-up  chest CT for further evaluation.  2. Decreased soft tissue of the left superior mediastinum,  compatible with treated tumor.  3. Previously seen small pulmonary nodules are stable.  4. Coronary artery calcifications, aortic Atherosclerosis  (ICD10-I70.0) and Emphysema (ICD10-J43.9).    LABS:    Latest Reference Range & Units 10/08/21 00:00  Sodium 137 - 147  134 !  Potassium 3.4 - 5.3  4.2  Chloride 99 - 108  107  CO2 13 - 22  20  Glucose  136  BUN 4 - 21  22 !  Creatinine 0.6 - 1.3  0.8  Calcium 8.7 - 10.7  8.8  Alkaline Phosphatase 25 - 125  66  Albumin 3.5 - 5.0  3.9  AST 14 - 40  28  ALT 10 - 40  43 !  Bilirubin, Total  1.2  WBC  10.2  RBC 3.87 - 5.11  5.69 !  Hemoglobin 13.5 - 17.5  15.9  HCT 41 - 53  49  Platelets 150 - 399  179  NEUT#  9.49     ASSESSMENT & PLAN:  Assessment/Plan:  A 66 y.o. 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  In clinic today, I went over his chest CT images with him, for which he could see that there remains no obvious evidence of disease recurrence.  His small 52mm left lung lesion will be followed over time to ensure it does not represent disease recurrence.  Overall, I still have some consternation as to whether his brain lesion actually represents metastatic small cell cancer versus another type of pathology.  I will repeat his brain MRI in 1 month to see if his left brain lesion has changed in size or if other new lesions have developed.  Radiation oncology will continue to taper his Decadron.  Once again, I expressed to him the importance of smoking abstinence as to prevent new/recurrent lung cancer or a cardiovascular insult from developing over time.  The patient understands all the plans  discussed today and is in agreement with them.     I, Rita Ohara, am acting as scribe for Marice Potter, MD    I have reviewed this report as typed by the medical scribe, and it is complete and accurate.  Iysis Germain Macarthur Critchley, MD

## 2021-10-07 ENCOUNTER — Encounter: Payer: Self-pay | Admitting: Oncology

## 2021-10-08 ENCOUNTER — Encounter: Payer: Self-pay | Admitting: Oncology

## 2021-10-08 ENCOUNTER — Other Ambulatory Visit: Payer: Self-pay | Admitting: Oncology

## 2021-10-08 ENCOUNTER — Other Ambulatory Visit: Payer: Self-pay

## 2021-10-08 ENCOUNTER — Inpatient Hospital Stay: Payer: 59 | Attending: Oncology | Admitting: Oncology

## 2021-10-08 VITALS — BP 163/113 | HR 127 | Temp 98.3°F | Resp 16 | Ht 67.0 in | Wt 155.3 lb

## 2021-10-08 DIAGNOSIS — C383 Malignant neoplasm of mediastinum, part unspecified: Secondary | ICD-10-CM

## 2021-10-08 DIAGNOSIS — C3490 Malignant neoplasm of unspecified part of unspecified bronchus or lung: Secondary | ICD-10-CM | POA: Diagnosis not present

## 2021-10-08 LAB — CBC: RBC: 5.69 — AB (ref 3.87–5.11)

## 2021-10-08 LAB — BASIC METABOLIC PANEL
BUN: 22 — AB (ref 4–21)
CO2: 20 (ref 13–22)
Chloride: 107 (ref 99–108)
Creatinine: 0.8 (ref 0.6–1.3)
Glucose: 136
Potassium: 4.2 (ref 3.4–5.3)
Sodium: 134 — AB (ref 137–147)

## 2021-10-08 LAB — CBC AND DIFFERENTIAL
HCT: 49 (ref 41–53)
Hemoglobin: 15.9 (ref 13.5–17.5)
Neutrophils Absolute: 9.49
Platelets: 179 (ref 150–399)
WBC: 10.2

## 2021-10-08 LAB — COMPREHENSIVE METABOLIC PANEL
Albumin: 3.9 (ref 3.5–5.0)
Calcium: 8.8 (ref 8.7–10.7)

## 2021-10-08 LAB — HEPATIC FUNCTION PANEL
ALT: 43 — AB (ref 10–40)
AST: 28 (ref 14–40)
Alkaline Phosphatase: 66 (ref 25–125)
Bilirubin, Total: 1.2

## 2021-10-16 ENCOUNTER — Encounter: Payer: Self-pay | Admitting: Oncology

## 2021-10-24 ENCOUNTER — Encounter: Payer: Self-pay | Admitting: Oncology

## 2021-11-04 ENCOUNTER — Encounter: Payer: Self-pay | Admitting: Oncology

## 2021-11-04 NOTE — Progress Notes (Signed)
[facility]  [address] [place],  [place]  [address] [phone]  Clinic Day:  11/08/2021  Referring physician: Nicoletta Dress, MD  This document serves as a record of services personally performed by Dequincy Macarthur Critchley, MD. It was created on their behalf by [facility] E, a trained medical scribe. The creation of this record is based on the scribe's personal observations and the provider's statements to them.  HISTORY OF PRESENT ILLNESS:  The patient is a 66 y.o. male with limited stage small cell lung cancer.  He completed definitive chemoradiation, which consisted of 4 cycles of cisplatin/etoposide in October 2021.  Unfortunately, a brain MRI showed a large, isolated brain lesion in his left frontoparietal lobe, which has led to right-sided neurological deficits.  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  Over these past weeks, he has been receiving Decadron for his brain lesion, for which he now takes twice daily.  Of note, his initial brain MRI did not show any significant vasogenic edema.  He comes in today to go over his brain MRI to see if his left brain lesion has changed in size or if other new lesions have developed.  Since his last visit, the patient has been doing okay.  He reports his taste has been altered.  He also complains of cloudy thinking.  With respect to his primary lung cancer, he claims to have a cough, but says it has gotten better over these past few days.  Unfortunately, despite our numerous discussions in the past about the importance of smoking abstinence,  he continues to smoke on a daily basis.    PHYSICAL EXAM:  Blood pressure (!) 161/97, pulse (!) 112, temperature 98 F (36.7 C), temperature source Oral, resp. rate 18, weight 152 lb 9.6 oz (69.2 kg), SpO2 96 %. Wt Readings from Last 3  Encounters:  11/08/21 152 lb 9.6 oz (69.2 kg)  10/08/21 155 lb 4.8 oz (70.4 kg)  06/05/21 147 lb 11.2 oz (67 kg)   Body mass index is 23.9 kg/m. Performance status (ECOG):  0 Physical Exam Constitutional:      General: He is not in acute distress.    Appearance: He is not ill-appearing.     Comments: He looks weaker vs previous visit. Moon facies  HENT:     Head: Normocephalic.     Nose: Nose normal.     Mouth/Throat:     Mouth: Mucous membranes are moist.     Pharynx: Oropharynx is clear.  Cardiovascular:     Rate and Rhythm: Regular rhythm. Tachycardia present.     Heart sounds: No murmur heard.   No friction rub. No gallop.  Pulmonary:     Effort: Pulmonary effort is normal.     Breath sounds: Normal breath sounds.  Abdominal:     General: Bowel sounds are normal. There is no distension.     Palpations: Abdomen is soft.     Tenderness: There is no abdominal tenderness.  Musculoskeletal:     Cervical back: No rigidity.     Right lower leg: No edema.     Left lower leg: No edema.  Skin:    General: Skin is warm and dry.     Findings: No bruising, erythema or rash.  Neurological:     General: No focal deficit present.  Mental Status: He is oriented to person, place, and time.   SCANS:  Recent brain MRI has revealed the following:  FINDINGS: Brain: Large cystic mass in the left parietal lobe appears slightly larger. This measures approximately 5.1 x 3.8 x 4.9 cm. The cyst is hyperintense on T2 and FLAIR without restricted diffusion. There is a thin rim of mild enhancement in the cyst. Mild susceptibility in the wall the cyst unchanged may represent chronic hemorrhage. Minimal surrounding edema. There is mild mass-effect with shift to the posterior septum proximally 5 mm to the right. Midline shift is unchanged. No second enhancing lesion Few small white matter hyperintensities bilaterally unchanged likely due to chronic microvascular ischemia. Vascular: Normal  arterial flow voids. Skull and upper cervical spine: No focal skeletal lesion. Sinuses/Orbits: Mild mucosal edema right maxillary sinus otherwise clear sinuses. Negative orbit Other: None  IMPRESSION: Large cystic mass lesion left parietal lobe appears slightly larger.. 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 Cystic astrocytoma remains in the differential. No significant edema. 5 mm midline shift to the right is unchanged  LABS:    Latest Reference Range & Units 10/08/21 00:00  Sodium 137 - 147  134 !  Potassium 3.4 - 5.3  4.2  Chloride 99 - 108  107  CO2 13 - 22  20  Glucose  136  BUN 4 - 21  22 !  Creatinine 0.6 - 1.3  0.8  Calcium 8.7 - 10.7  8.8  Alkaline Phosphatase 25 - 125  66  Albumin 3.5 - 5.0  3.9  AST 14 - 40  28  ALT 10 - 40  43 !  Bilirubin, Total  1.2  WBC  10.2  RBC 3.87 - 5.11  5.69 !  Hemoglobin 13.5 - 17.5  15.9  HCT 41 - 53  49  Platelets 150 - 399  179  NEUT#  9.49     ASSESSMENT & PLAN:  Assessment/Plan:  A 66 y.o. 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  In clinic today, I went over his brain MRI images with him, for which he can see that the lesion in his left parieto-occipital region has grown minimally in size.  There appears to be no significant surrounding vasogenic edema.  Furthermore, there are no new sites of CNS metastasis.  I did get a chance to discuss this gentlemen's brain MRI with a neurosurgeon, who feels the lesion is in the motor strip area and would be difficult to resect without leading to some deficits.  There is a high possibility that this lesion represents metastatic small cell lung cancer.  However, this overall presentation is different, especially as it is an isolated lesion.  Neurosurgery recommended that this lesion continue to be followed over time.  If it gets larger or leads to worsening neurologic deficits, this lesion could be re-evaluated.  As his  lesion does have a cystic component to it, the concern is that aggravating this area could lead to fluid release and worsening neurological problems.  A repeat brain MRI will be done in 3 months to reassess the area.  On another note, I was concerned with this gentleman's coughing today.  Based upon this, a repeat chest CT will be done next week to ensure his primary small cell lung cancer has not returned.  I have also told him to decrease his Decadron to one pill daily for the next week.  I will see him  back next week to go over his chest CT images and their implications.  The patient understands all the plans discussed today and is in agreement with them.    I, Rita Ohara, am acting as scribe for Marice Potter, MD    I have reviewed this report as typed by the medical scribe, and it is complete and accurate.  Dequincy Macarthur Critchley, MD

## 2021-11-08 ENCOUNTER — Other Ambulatory Visit: Payer: Self-pay

## 2021-11-08 ENCOUNTER — Inpatient Hospital Stay: Payer: Medicaid Other | Attending: Oncology | Admitting: Oncology

## 2021-11-08 ENCOUNTER — Encounter: Payer: Self-pay | Admitting: Oncology

## 2021-11-08 ENCOUNTER — Other Ambulatory Visit: Payer: Self-pay | Admitting: Oncology

## 2021-11-08 VITALS — BP 161/97 | HR 112 | Temp 98.0°F | Resp 18 | Wt 152.6 lb

## 2021-11-08 DIAGNOSIS — C3490 Malignant neoplasm of unspecified part of unspecified bronchus or lung: Secondary | ICD-10-CM

## 2021-11-08 NOTE — Progress Notes (Signed)
Patient here for oncology follow-up appointment, concerns of "cloudy thinking" and loss of taste

## 2021-11-12 ENCOUNTER — Encounter: Payer: Self-pay | Admitting: Oncology

## 2021-11-12 NOTE — Progress Notes (Signed)
[facility]  [address] [place],  [place]  [address] [phone]  Clinic Day:  11/13/2021  Referring physician: Nicoletta Dress, MD  This document serves as a record of services personally performed by Mikaela Hilgeman Macarthur Critchley, MD. It was created on their behalf by [facility] E, a trained medical scribe. The creation of this record is based on the scribe's personal observations and the provider's statements to them.  HISTORY OF PRESENT ILLNESS:  The patient is a 66 y.o. male with limited stage small cell lung cancer.  He completed definitive chemoradiation, which consisted of 4 cycles of cisplatin/etoposide in October 2021.  Unfortunately, a brain MRI showed a large, isolated brain lesion in his left frontoparietal lobe, which has led to mild right-sided neurological deficits.  He comes in today to go over his chest CT, which was started him having increased cough over these past few weeks.  Overall, the patient is doing okay.  His cough does lead to brown sputum production.  He denies having worsening shortness of breath or hemoptysis.  PHYSICAL EXAM:  Blood pressure (!) 157/98, pulse (!) 109, temperature 97.7 F (36.5 C), temperature source Oral, resp. rate 20, height 5\' 7"  (1.702 m), weight 144 lb 14.4 oz (65.7 kg), SpO2 95 %. Wt Readings from Last 3 Encounters:  11/13/21 144 lb 14.4 oz (65.7 kg)  11/08/21 152 lb 9.6 oz (69.2 kg)  10/08/21 155 lb 4.8 oz (70.4 kg)   Body mass index is 22.69 kg/m. Performance status (ECOG):  0 Physical Exam Constitutional:      General: He is not in acute distress.    Appearance: He is not ill-appearing.     Comments: He looks weaker vs previous visit. Moon facies  HENT:     Head: Normocephalic.     Nose: Nose normal.     Mouth/Throat:     Mouth: Mucous membranes are moist.     Pharynx: Oropharynx is clear.  Cardiovascular:     Rate and Rhythm: Regular rhythm. Tachycardia present.     Heart sounds: No murmur  heard.   No friction rub. No gallop.  Pulmonary:     Effort: Pulmonary effort is normal.     Breath sounds: Normal breath sounds.  Abdominal:     General: Bowel sounds are normal. There is no distension.     Palpations: Abdomen is soft.     Tenderness: There is no abdominal tenderness.  Musculoskeletal:     Cervical back: No rigidity.     Right lower leg: No edema.     Left lower leg: No edema.  Skin:    General: Skin is warm and dry.     Findings: No bruising, erythema or rash.  Neurological:     General: No focal deficit present.     Mental Status: He is oriented to person, place, and time.   SCANS:  Recent chest CT has revealed the following: FINDINGS:  Cardiovascular: Heart size is normal. There is no significant  pericardial fluid, thickening or pericardial calcification. There is  aortic atherosclerosis, as well as atherosclerosis of the great  vessels of the mediastinum and the coronary arteries, including  calcified atherosclerotic plaque in the left anterior descending  coronary artery.  Mediastinum/Nodes: Borderline enlarged right hilar lymph node  measuring 1.3 cm in short axis, increased in size compared to the  prior study. No other definite pathologically enlarged mediastinal  or hilar lymph nodes are noted. Esophagus is unremarkable in  appearance.  No axillary lymphadenopathy. Right internal jugular  single-lumen porta cath with tip terminating at the superior  cavoatrial junction.  Lungs/Pleura: Previously noted left upper lobe pulmonary nodule is  currently a 3.1 x 2.3 cm partially cavitary mass (axial image 85 of  series 301). Multiple other new pulmonary nodules and masses are  noted elsewhere in the lungs bilaterally, the largest of which is in  the medial aspect of the right lower lobe (axial image 88 of series  301) measuring 4.1 x 3.3 cm. Several of these lesions demonstrates  some internal cavitation (example on axial image 88 of series 31 in  the  left lower lobe). No acute consolidative airspace disease. No  pleural effusions. Diffuse bronchial wall thickening with mild to  moderate centrilobular and paraseptal emphysema.  Upper Abdomen: Aortic atherosclerosis.  Musculoskeletal: There are no aggressive appearing lytic or blastic  lesions noted in the visualized portions of the skeleton.   IMPRESSION:  1. Today's study demonstrates definitive progression of disease with  numerous new and enlarging pulmonary nodules and masses, as detailed  above. There is also a borderline enlarged right hilar lymph node,  which is likely metastatic.  2. Diffuse bronchial wall thickening with mild to moderate  centrilobular and paraseptal emphysema; imaging findings suggestive  of underlying COPD.  3. Aortic atherosclerosis, in addition to left anterior descending  coronary artery disease. Please note that although the presence of  coronary artery calcium documents the presence of coronary artery  disease, the severity of this disease and any potential stenosis  cannot be assessed on this non-gated CT examination. Assessment for  potential risk factor modification, dietary therapy or pharmacologic  therapy may be warranted, if clinically indicated.  Aortic Atherosclerosis (ICD10-I70.0) and Emphysema (ICD10-J43.9).    ASSESSMENT & PLAN:  Assessment/Plan:  A 66 y.o. male with a history of limited stage small cell lung cancer.  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  In clinic today, I went over his chest CT images with him, which are extremely concerning.  This gentleman has bilateral pulmonary nodules which were not seen on a chest CT just  done 1 month ago.  These findings are highly concerning for him having metastatic small cell lung cancer.  For completeness, I will have this gentleman undergo a CT-guided lung biopsy of one of the peripheral lung nodules  in question.  Understandably, the patient is distraught with his most recent chest CT findings.  As he does have a productive cough, I will prescribe him Levaquin 750 mg daily for the next week for the possibility of him having pneumonia.  Tessalon Perles will also be written to ameliorate his cough.  However, he understands that my major concern is that he has metastatic small cell lung cancer.  The new findings in his lungs actually strengthen the belief that the isolated brain lesion in question also represents metastatic small cell lung cancer.  I will see this patient back in 2 weeks to go over his lung biopsy results and their implications. The patient and his wife understand all the plans discussed today and are in agreement with them.    I, Rita Ohara, am acting as scribe for Marice Potter, MD    I have reviewed this report as typed by the medical scribe, and it is complete and accurate.  Spencer Janusz Macarthur Critchley, MD

## 2021-11-13 ENCOUNTER — Inpatient Hospital Stay (INDEPENDENT_AMBULATORY_CARE_PROVIDER_SITE_OTHER): Payer: Medicaid Other | Admitting: Oncology

## 2021-11-13 ENCOUNTER — Other Ambulatory Visit: Payer: Self-pay | Admitting: Oncology

## 2021-11-13 ENCOUNTER — Other Ambulatory Visit: Payer: Self-pay

## 2021-11-13 VITALS — BP 157/98 | HR 109 | Temp 97.7°F | Resp 20 | Ht 67.0 in | Wt 144.9 lb

## 2021-11-13 DIAGNOSIS — C3490 Malignant neoplasm of unspecified part of unspecified bronchus or lung: Secondary | ICD-10-CM

## 2021-11-13 MED ORDER — LEVOFLOXACIN 750 MG PO TABS: 750.0000 mg | ORAL_TABLET | Freq: Every day | ORAL | 0 refills | Status: DC

## 2021-11-13 MED ORDER — BENZONATATE 100 MG PO CAPS: 100.0000 mg | ORAL_CAPSULE | Freq: Three times a day (TID) | ORAL | 0 refills | Status: DC | PRN

## 2021-11-14 ENCOUNTER — Encounter (HOSPITAL_COMMUNITY): Payer: Self-pay

## 2021-11-14 ENCOUNTER — Other Ambulatory Visit: Payer: Self-pay | Admitting: Oncology

## 2021-11-14 ENCOUNTER — Other Ambulatory Visit (HOSPITAL_COMMUNITY): Payer: Self-pay | Admitting: Oncology

## 2021-11-14 DIAGNOSIS — R918 Other nonspecific abnormal finding of lung field: Secondary | ICD-10-CM

## 2021-11-14 DIAGNOSIS — C3490 Malignant neoplasm of unspecified part of unspecified bronchus or lung: Secondary | ICD-10-CM

## 2021-11-14 NOTE — Progress Notes (Signed)
Patient Name  Spencer Butler, Macken Legal Sex  Male DOB  1955/10/13 SSN  [id number] Address  [address]  [place] [address] Phone  [phone] [facility])  [phone] (Mobile) *Preferred*    RE: CT LUNG MASS BIOPSY Received: Today Arne Cleveland, MD  Diana Eves M Discussed with Dr Bobby Rumpf  He will consult pulm for bronch  Do NOT sched biopsy   DDH        Previous Messages   ----- Message -----  From: Valli Glance  Sent: 11/14/2021  12:13 PM EST  To: [facility] Procedure Requests  Subject: CT LUNG MASS BIOPSY                             CT LUNG MASS BIOPSY         Reason: Lung nodules          History: CT in computer          Provider: Lavera Guise A        Contact: [phone]

## 2021-11-21 NOTE — Progress Notes (Signed)
?[facility]  ?[address] ?[place],  [place]  [address] ?[phone] ? ?Clinic Day:  11/27/2021 ? ?Referring physician: Nicoletta Dress, MD ? ?This document serves as a record of services personally performed by Marice Potter, MD. It was created on their behalf by Curry,Lauren E, a trained medical scribe. The creation of this record is based on the scribe's personal observations and the provider's statements to them. ? ?HISTORY OF PRESENT ILLNESS:  ?The patient is a 66 y.o. male with limited stage small cell lung cancer.  He completed definitive chemoradiation, which consisted of 4 cycles of cisplatin/etoposide in October 2021.  Unfortunately, a brain MRI showed a large, isolated brain lesion in his left frontoparietal lobe, which has led to mild right-sided neurological deficits. CT imaging also revealed bilateral pulmonary nodules which were not seen on a chest CT just done 1 month ago.  These scans were done to him having coughing spells.  At his last visit, he was prescribed levofloxacin and Tessalon Perles.  His wife claims he has also been on nebulizer treatments.  Fortunately, his coughing has since dissipated.  A lung biopsy has been discussed, but not scheduled yet.  His wife is concerned as he has had increased weakness in his right leg.  He also has apparently had more headaches and occasionally has visual hallucinations.  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  His wife has also been concerned with the significant decline in his caloric and fluid intake.  She claims his urine is very dark. ? ?PHYSICAL EXAM:  ?Blood pressure 123/84, pulse (!) 116, temperature 98.3 ?F (36.8 ?C), resp. rate 16, height 5\' 7"  (1.702 m), weight 136 lb 3.2 oz (61.8 kg), SpO2 91 %. ?Wt Readings from Last 3 Encounters:  ?11/27/21 136 lb 3.2 oz (61.8 kg)  ?11/13/21 144 lb 14.4 oz (65.7 kg)  ?11/08/21 152 lb 9.6 oz (69.2 kg)  ? ?Body mass  index is 21.33 kg/m?Marland Kitchen ?Performance status (ECOG):  0 ?Physical Exam ?Constitutional:   ?   General: He is not in acute distress. ?   Appearance: He is not ill-appearing.  ?   Comments: He looks weaker vs previous visits.  He is in a wheelchair. ?  ?HENT:  ?   Head: Normocephalic.  ?   Nose: Nose normal.  ?   Mouth/Throat:  ?   Mouth: Mucous membranes are moist.  ?   Pharynx: Oropharynx is clear.  ?Cardiovascular:  ?   Rate and Rhythm: Regular rhythm. Tachycardia present.  ?   Heart sounds: No murmur heard. ?  No friction rub. No gallop.  ?Pulmonary:  ?   Effort: Pulmonary effort is normal.  ?   Breath sounds: Normal breath sounds.  ?Abdominal:  ?   General: Bowel sounds are normal. There is no distension.  ?   Palpations: Abdomen is soft.  ?   Tenderness: There is no abdominal tenderness.  ?Musculoskeletal:  ?   Cervical back: No rigidity.  ?   Right lower leg: No edema.  ?   Left lower leg: No edema.  ?Skin: ?   General: Skin is warm and dry.  ?   Findings: No bruising, erythema or rash.  ?Neurological:  ?   General: No focal deficit present.  ?   Mental Status: He is oriented to person, place, and time.  ? ?LABS: ? Latest Reference Range & Units 11/27/21 00:00  ?Sodium 137 -  147  137  ?Potassium 3.4 - 5.3  4.2  ?Chloride 99 - 108  111 !  ?CO2 13 - 22  15  ?Glucose  106  ?BUN 4 - 21  16  ?Creatinine 0.6 - 1.3  0.9  ?Calcium 8.7 - 10.7  9.2  ?Alkaline Phosphatase 25 - 125  96  ?Albumin 3.5 - 5.0  3.4 !  ?AST 14 - 40  40  ?ALT 10 - 40  38  ?Bilirubin, Total  1.0  ?WBC  9.1  ?RBC 3.87 - 5.11  5.16 !  ?Hemoglobin 13.5 - 17.5  14.7  ?HCT 41 - 53  45  ?Platelets 150 - 399  154  ?!: Data is abnormal ? ?ASSESSMENT & PLAN:  ?Assessment/Plan:  A 66 y.o. male with a history of limited stage small cell lung cancer.  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  In clinic today, I will give this patient 1.5 L of normal saline as I do  believe he is dehydrated.  With his decreased caloric intake, I told him to get back to taking his Megace 800 mg daily.  With respect to his lung cancer, I am pleased as his coughing has ceased.  It does make me think that his bilateral pulmonary nodules seen on his last chest CT may actually have reflected pneumonia more so than worsening small cell lung cancer.  A chest CT will be repeated in 2 weeks to see reassess these lung nodules.  If they look better, then it likely reflects his disease has not progressed.  If there are worsening nodules, then second line therapy for small cell lung cancer in the form of lurbinectedin would need to be considered.  I will see this patient in 2 weeks to go over his CT scan images and their implications.  The patient and his wife understand all the plans discussed today and are in agreement with them.   ? ?I, Rita Ohara, am acting as scribe for Marice Potter, MD   ? ?I have reviewed this report as typed by the medical scribe, and it is complete and accurate. ? ?Darryel Diodato Macarthur Critchley, MD ? ? ? ?  ?

## 2021-11-27 ENCOUNTER — Encounter: Payer: Self-pay | Admitting: Oncology

## 2021-11-27 ENCOUNTER — Inpatient Hospital Stay: Payer: Medicaid Other | Attending: Oncology | Admitting: Oncology

## 2021-11-27 ENCOUNTER — Inpatient Hospital Stay: Payer: Medicaid Other

## 2021-11-27 ENCOUNTER — Other Ambulatory Visit: Payer: Self-pay | Admitting: Oncology

## 2021-11-27 ENCOUNTER — Other Ambulatory Visit: Payer: Self-pay

## 2021-11-27 ENCOUNTER — Other Ambulatory Visit: Payer: Self-pay | Admitting: Hematology and Oncology

## 2021-11-27 ENCOUNTER — Telehealth: Payer: Self-pay

## 2021-11-27 VITALS — BP 123/84 | HR 116 | Temp 98.3°F | Resp 16 | Ht 67.0 in | Wt 136.2 lb

## 2021-11-27 DIAGNOSIS — C3490 Malignant neoplasm of unspecified part of unspecified bronchus or lung: Secondary | ICD-10-CM | POA: Diagnosis not present

## 2021-11-27 DIAGNOSIS — E860 Dehydration: Secondary | ICD-10-CM | POA: Insufficient documentation

## 2021-11-27 DIAGNOSIS — Z9221 Personal history of antineoplastic chemotherapy: Secondary | ICD-10-CM | POA: Insufficient documentation

## 2021-11-27 DIAGNOSIS — C7931 Secondary malignant neoplasm of brain: Secondary | ICD-10-CM | POA: Insufficient documentation

## 2021-11-27 DIAGNOSIS — C383 Malignant neoplasm of mediastinum, part unspecified: Secondary | ICD-10-CM | POA: Diagnosis not present

## 2021-11-27 DIAGNOSIS — Z923 Personal history of irradiation: Secondary | ICD-10-CM | POA: Insufficient documentation

## 2021-11-27 LAB — HEPATIC FUNCTION PANEL
ALT: 38 (ref 10–40)
AST: 40 (ref 14–40)
Alkaline Phosphatase: 96 (ref 25–125)
Bilirubin, Total: 1

## 2021-11-27 LAB — CBC AND DIFFERENTIAL
HCT: 45 (ref 41–53)
Hemoglobin: 14.7 (ref 13.5–17.5)
Neutrophils Absolute: 6.1
Platelets: 154 (ref 150–399)
WBC: 9.1

## 2021-11-27 LAB — BASIC METABOLIC PANEL
BUN: 16 (ref 4–21)
CO2: 15 (ref 13–22)
Chloride: 111 — AB (ref 99–108)
Creatinine: 0.9 (ref 0.6–1.3)
Glucose: 106
Potassium: 4.2 (ref 3.4–5.3)
Sodium: 137 (ref 137–147)

## 2021-11-27 LAB — COMPREHENSIVE METABOLIC PANEL
Albumin: 3.4 — AB (ref 3.5–5.0)
Calcium: 9.2 (ref 8.7–10.7)

## 2021-11-27 LAB — CBC: RBC: 5.16 — AB (ref 3.87–5.11)

## 2021-11-27 MED ORDER — HEPARIN SOD (PORK) LOCK FLUSH 100 UNIT/ML IV SOLN
500.0000 [IU] | Freq: Once | INTRAVENOUS | Status: AC | PRN
  Administered 2021-11-27: 500 [IU]

## 2021-11-27 MED ORDER — SODIUM CHLORIDE 0.9 % IV SOLN: Freq: Once | INTRAVENOUS | Status: AC

## 2021-11-27 MED ORDER — SODIUM CHLORIDE 0.9% FLUSH
10.0000 mL | Freq: Once | INTRAVENOUS | Status: AC | PRN
  Administered 2021-11-27: 10 mL

## 2021-11-27 MED ORDER — SODIUM CHLORIDE 0.9 % IV SOLN
Freq: Once | INTRAVENOUS | Status: AC
  Administered 2021-11-27: 500 mL via INTRAVENOUS

## 2021-11-27 NOTE — Addendum Note (Signed)
Addended byGeorgette Shell on: 11/27/2021 04:44 PM ? ? Modules accepted: Orders ? ?

## 2021-11-27 NOTE — Telephone Encounter (Signed)
Per Dr. Bobby Rumpf, called  [facility] @ [phone] and advised to hold Bronchoscopy until after scans in two weeks.  Spoke with Gracie. ?

## 2021-11-28 ENCOUNTER — Encounter: Payer: Self-pay | Admitting: Oncology

## 2021-11-28 ENCOUNTER — Telehealth: Payer: Self-pay | Admitting: Oncology

## 2021-11-28 NOTE — Telephone Encounter (Signed)
CT Chest has been scheduled for 12/10/21 @ 2pm ; Check in at 1 pm ? ?Notified pt's wife of date,time and instructions. ? ?

## 2021-12-04 NOTE — Progress Notes (Incomplete)
[facility]  [address] [place],  [place]  [address] [phone]  Clinic Day:  12/11/2021  Referring physician: Nicoletta Dress, MD  This document serves as a record of services personally performed by Spencer Macarthur Critchley, MD. It was created on their behalf by [facility] E, a trained medical scribe. The creation of this record is based on the scribe's personal observations and the provider's statements to them.  HISTORY OF PRESENT ILLNESS:  The patient is a 66 y.o. male with limited stage small cell lung cancer.  He completed definitive chemoradiation, which consisted of 4 cycles of cisplatin/etoposide in October 2021.  Unfortunately, a brain MRI showed a large, isolated brain lesion in his left frontoparietal lobe, which has led to mild right-sided neurological deficits. CT imaging also revealed bilateral pulmonary nodules which were not seen on a chest CT just done 1 month ago.  These scans were done to him having coughing spells.  At his last visit, he was prescribed levofloxacin and Tessalon Perles.  His wife claims he has also been on nebulizer treatments.  Fortunately, his coughing has since dissipated.  A lung biopsy has been discussed, but not scheduled yet.  His wife is concerned as he has had increased weakness in his right leg.  He also has apparently had more headaches and occasionally has visual hallucinations.  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  His wife has also been concerned with the significant decline in his caloric and fluid intake.  She claims his urine is very dark.  PHYSICAL EXAM:  There were no vitals taken for this visit. Wt Readings from Last 3 Encounters:  11/27/21 136 lb 3.2 oz (61.8 kg)  11/13/21 144 lb 14.4 oz (65.7 kg)  11/08/21 152 lb 9.6 oz (69.2 kg)   There is no height or weight on file to calculate BMI. Performance status (ECOG):  0 Physical Exam Constitutional:       General: He is not in acute distress.    Appearance: He is not ill-appearing.     Comments: He looks weaker vs previous visits.  He is in a wheelchair.   HENT:     Head: Normocephalic.     Nose: Nose normal.     Mouth/Throat:     Mouth: Mucous membranes are moist.     Pharynx: Oropharynx is clear.  Cardiovascular:     Rate and Rhythm: Regular rhythm. Tachycardia present.     Heart sounds: No murmur heard.   No friction rub. No gallop.  Pulmonary:     Effort: Pulmonary effort is normal.     Breath sounds: Normal breath sounds.  Abdominal:     General: Bowel sounds are normal. There is no distension.     Palpations: Abdomen is soft.     Tenderness: There is no abdominal tenderness.  Musculoskeletal:     Cervical back: No rigidity.     Right lower leg: No edema.     Left lower leg: No edema.  Skin:    General: Skin is warm and dry.     Findings: No bruising, erythema or rash.  Neurological:     General: No focal deficit present.     Mental Status: He is oriented to person, place, and time.   LABS:  Latest Reference Range & Units 11/27/21 00:00  Sodium 137 - 147  137  Potassium 3.4 - 5.3  4.2  Chloride 99 - 108  111 !  CO2 13 - 22  15  Glucose  106  BUN 4 - 21  16  Creatinine 0.6 - 1.3  0.9  Calcium 8.7 - 10.7  9.2  Alkaline Phosphatase 25 - 125  96  Albumin 3.5 - 5.0  3.4 !  AST 14 - 40  40  ALT 10 - 40  38  Bilirubin, Total  1.0  WBC  9.1  RBC 3.87 - 5.11  5.16 !  Hemoglobin 13.5 - 17.5  14.7  HCT 41 - 53  45  Platelets 150 - 399  154  !: Data is abnormal  ASSESSMENT & PLAN:  Assessment/Plan:  A 66 y.o. male with a history of limited stage small cell lung cancer.  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  In clinic today, I will give this patient 1.5 L of normal saline as I do believe he is dehydrated.  With his decreased caloric intake, I told him to get back to  taking his Megace 800 mg daily.  With respect to his lung cancer, I am pleased as his coughing has ceased.  It does make me think that his bilateral pulmonary nodules seen on his last chest CT may actually have reflected pneumonia more so than worsening small cell lung cancer.  A chest CT will be repeated in 2 weeks to see reassess these lung nodules.  If they look better, then it likely reflects his disease has not progressed.  If there are worsening nodules, then second line therapy for small cell lung cancer in the form of lurbinectedin would need to be considered.  I will see this patient in 2 weeks to go over his CT scan images and their implications.  The patient and his wife understand all the plans discussed today and are in agreement with them.    I, Rita Ohara, am acting as scribe for Marice Potter, MD    I have reviewed this report as typed by the medical scribe, and it is complete and accurate.  Spencer Macarthur Critchley, MD

## 2021-12-09 ENCOUNTER — Encounter: Payer: Self-pay | Admitting: Oncology

## 2021-12-10 ENCOUNTER — Encounter: Payer: Self-pay | Admitting: Oncology

## 2021-12-11 ENCOUNTER — Inpatient Hospital Stay (INDEPENDENT_AMBULATORY_CARE_PROVIDER_SITE_OTHER): Payer: Medicaid Other | Admitting: Oncology

## 2021-12-11 ENCOUNTER — Telehealth: Payer: Self-pay | Admitting: Oncology

## 2021-12-11 VITALS — BP 144/109 | HR 139 | Temp 98.1°F | Resp 14 | Ht 67.0 in | Wt 129.4 lb

## 2021-12-11 DIAGNOSIS — C3490 Malignant neoplasm of unspecified part of unspecified bronchus or lung: Secondary | ICD-10-CM | POA: Diagnosis not present

## 2021-12-11 MED ORDER — DEXAMETHASONE 4 MG PO TABS: ORAL_TABLET | ORAL | 1 refills | Status: DC

## 2021-12-11 NOTE — Telephone Encounter (Signed)
Per 12/11/21 los next appt scheduled and confirmed with patient ?

## 2021-12-12 ENCOUNTER — Other Ambulatory Visit: Payer: Self-pay | Admitting: Radiation Therapy

## 2021-12-12 NOTE — Progress Notes (Incomplete)
Location/Histology of Brain Tumor:  Left frontoparietal lobe with mid right-sided neurological deficits ? ?Associate problem:  Small cell lung Ca ? ?Past or anticipated interventions, if any, per neurosurgery: {:18581} ? ?Past or anticipated interventions, if any, per medical oncology: Completed 4 cycles chemoradiation of cisplatin D1 +Etoposide D1-3 (October 2021). ? ?Dose of Decadron, if applicable: Decadron 4 mg twice daily. ? ?Recent neurologic symptoms, if any:  ?Seizures: {:18581} ?Headaches: {:18581} ?Nausea: {:18581} ?Dizziness/ataxia: {:18581} ?Difficulty with hand coordination: {:18581} ?Focal numbness/weakness: {:18581} ?Visual deficits/changes: {:18581} ?Confusion/Memory deficits: {:18581} ? ?Painful bone metastases at present, if any: {:18581} ? ?SAFETY ISSUES: ?Prior radiation? {:18581} ?Pacemaker/ICD? {:18581} ?Possible current pregnancy? Male ?Is the patient on methotrexate? {:18581} ? ?Additional Complaints / other details: 30 lb wt. loss over 30 day period despite taking Megace daily. Uses wheelchair for mobility needs.  Right chest port-a-cath. ?

## 2021-12-16 ENCOUNTER — Other Ambulatory Visit: Payer: Self-pay | Admitting: Radiation Therapy

## 2021-12-16 ENCOUNTER — Ambulatory Visit
Admission: RE | Admit: 2021-12-16 | Discharge: 2021-12-16 | Disposition: A | Discharge status: Home / Self Care | Payer: Self-pay | Source: Ambulatory Visit | Attending: Radiation Oncology | Admitting: Radiation Oncology

## 2021-12-16 ENCOUNTER — Encounter: Payer: Self-pay | Admitting: Oncology

## 2021-12-16 ENCOUNTER — Inpatient Hospital Stay: Payer: Medicaid Other

## 2021-12-16 ENCOUNTER — Encounter: Payer: Self-pay | Admitting: Radiation Therapy

## 2021-12-16 DIAGNOSIS — C7931 Secondary malignant neoplasm of brain: Secondary | ICD-10-CM

## 2021-12-16 NOTE — Progress Notes (Addendum)
Spencer Butler case was reviewed during the 3/20 brain and spine conference. The recommendation is to re scan in 2 months and re-evaluate at that time. The consult with Dr. Tammi Klippel has been changed to a telephone/virtual visit and the planning MRI has been rescheduled to 4/14. He will be reviewed again on 4/17 for recommendations of salvage treatment vs need for biopsy to verify small cell histology.  ? ?Mont Dutton R.T.(R)(T) ?Radiation Special Procedures Navigator  ?

## 2021-12-17 ENCOUNTER — Ambulatory Visit: Payer: Medicaid Other

## 2021-12-17 ENCOUNTER — Ambulatory Visit
Admission: RE | Admit: 2021-12-17 | Discharge: 2021-12-17 | Disposition: A | Discharge status: Home / Self Care | Payer: Medicaid Other | Source: Ambulatory Visit | Attending: Radiation Oncology | Admitting: Radiation Oncology

## 2021-12-17 ENCOUNTER — Other Ambulatory Visit: Payer: Self-pay

## 2021-12-17 VITALS — Ht 67.0 in | Wt 129.0 lb

## 2021-12-17 DIAGNOSIS — C3490 Malignant neoplasm of unspecified part of unspecified bronchus or lung: Secondary | ICD-10-CM

## 2021-12-17 DIAGNOSIS — C383 Malignant neoplasm of mediastinum, part unspecified: Secondary | ICD-10-CM

## 2021-12-17 DIAGNOSIS — G9389 Other specified disorders of brain: Secondary | ICD-10-CM

## 2021-12-17 NOTE — Progress Notes (Signed)
?Radiation Oncology         [phone] ?________________________________ ? ?Initial outpatient Consultation ? ?Name: Spencer Butler MRN: [id number]  ?Date of Service: 12/17/2021 DOB: Nov 19, 1955 ? ?DJ:SHFWYOV, Lora Havens, MD  Gatha Mayer, MD  ? ?REFERRING PHYSICIAN: Gatha Mayer, MD ? 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 ? ?  ICD-10-CM   ?1. 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  ?2. Brain mass  G93.89   ?  ? ? ?HISTORY OF PRESENT ILLNESS: Spencer Butler is a 66 y.o. male seen at the request of Dr. Bobby Rumpf. In summary, he presented to his PCP in 04/2020 with left neck swelling, hoarseness, dysphagia, fatigue, and unintentional weight loss. Work up showed a 7.2 cm FDG-avid mass encasing aortic arch, as well as lymphadenopathy. Mediastinoscopy with biopsy was performed on 06/01/20 with Dr. Benjie Karvonen, and pathology revealed small cell carcinoma. He was treated with concurrent chemoradiation, consisting of 4 cycles of cisplatin/etoposide, in 06/2020, under the care of Dr. Bobby Rumpf and Dr. Orlene Erm at the [facility]. MRI brain for disease staging in 05/2020 was negative for any evidence of metastatic disease. ? ?In 08/2021, he presented to the ED with complaints of right-sided weakness. An MRI brain was performed on 09/04/21 and revealed a large 4.8 cm complex cystic lesion centered within the left frontoparietal region.  He was seen in consultation by Dr. James Ivanoff [facility] neurosurgery ) and due to the size and location of the mass, the recommendation was to proceed with radiation therapy, and he subsequently completed whole brain radiation under the care of Dr. Orlene Erm from 09/16/2021 through 10/03/2021.  A follow-up CT chest performed on 10/08/2021 showed a new, 6 mm solid appearing nodule in the left upper lobe with stability of the previously treated disease on the left.  He continued in  observation with a short interval CT chest on 11/11/2021 showing significant disease progression bilaterally in the lungs and hilar lymph nodes.  Recommendation at that time was for biopsy of one of the pulmonary lesions so he met with Dr. Alcide Clever to discuss bronchoscopy for biopsy.  Given his symptomatic cough, it was decided to treat with antibiotics and steroids first and he did initially notice some improvement in his symptoms. ? ?A post-treatment MRI brain performed on 11/06/21 showed slight enlargement of the large cystic mass lesion in the left frontoparietal lobe.  At the time of his follow-up visit with Dr. Bobby Rumpf on 11/27/2021, he had not yet had biopsy and was concerned with progressive weakness in the right lower extremity, fatigue/malaise and decreased appetite.  He was treated with another course of antibiotics for suspected pneumonia and a follow-up CT chest on 12/10/2021 showed that the bilateral pulmonary nodules had significantly improved except for one nodule in the right lower lobe.  The decision was to continue in observation.  Due to his decline in overall health and performance status, it was requested that his scans be reviewed in the multidisciplinary brain conference on 12/16/2021.  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 ? ?He has been kindly referred to [place] today to review these findings and recommendations from the multidisciplinary brain conference. ? ?PREVIOUS RADIATION THERAPY: Yes  ?  09/16/21 - 10/03/21: Whole brain / 30 Gy in 12 fractions ? ?07/02/20 - 08/14/20: Left lung/mediastinum / 60 Gy in 30 fractions, concurrent with systemic chemotherapy ? ?PAST MEDICAL HISTORY:  ?Past Medical History:  ?Diagnosis Date  ? Malignant neoplasm of  mediastinum, part unspecified ([place])   ? 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   ? ?PAST SURGICAL HISTORY:No past surgical history on file. ? ?FAMILY HISTORY: No family history on file. ? ?SOCIAL HISTORY:  ?Social History  ? ?Socioeconomic History  ? Marital status: Married  ?  Spouse name: Not on file  ? Number of children: Not on file  ? Years of education: Not on file  ? Highest education level: Not on file  ?Occupational History  ? Not on file  ?Tobacco Use  ? Smoking status: Former  ?  Packs/day: 1.00  ?  Types: Cigarettes  ? Smokeless tobacco: Never  ?Substance and Sexual Activity  ? Alcohol use: Not on file  ? Drug use: Not on file  ? Sexual activity: Not on file  ?Other Topics Concern  ? Not on file  ?Social History Narrative  ? Not on file  ? ?Social Determinants of Health  ? ?Financial Resource Strain: Not on file  ?Food Insecurity: Not on file  ?Transportation Needs: Not on file  ?Physical Activity: Not on file  ?Stress: Not on file  ?Social Connections: Not on file  ?Intimate Partner Violence: Not on file  ? ? ?ALLERGIES: Patient has no known allergies. ? ?MEDICATIONS:  ?Current Outpatient Medications  ?Medication Sig Dispense Refill  ? dexamethasone (DECADRON) 4 MG tablet One tab po bid 60 tablet 1  ? albuterol (ACCUNEB) 0.63 MG/3ML nebulizer solution Take 1 ampule by nebulization every 6 (six) hours as needed for wheezing. (Patient not taking: Reported on 12/17/2021)    ? Fluticasone-Umeclidin-Vilant (TRELEGY ELLIPTA) 100-62.5-25 MCG/ACT AEPB Inhale into the lungs. (Patient not taking: Reported on 12/17/2021)    ? megestrol (MEGACE) 40 MG tablet Take 40 mg by mouth daily. (Patient not taking: Reported on 12/17/2021)    ? ?No current facility-administered medications for this encounter.  ? ? ?REVIEW OF SYSTEMS:  On review of systems, the patient reports that he is not doing well at all.  At this point, he has significant  generalized weakness, fatigue/malaise, decreased appetite and is pretty much bedbound aside from doctors visits.  He denies any chest pain, increased shortness of breath or cough, fevers, chills, or night sweats. He reports a weight loss of approximately 30 lbs since December 2022, despite taking Megace daily. He denies any bowel or bladder disturbances, and denies abdominal pain, nausea or vomiting. He denies any new musculoskeletal or joint aches or pains. He reports continued right-sided paresthesias/weakness and difficulty with hand coordination. He also reports confusion/memory deficits post whole brain radiation. A complete review of systems is obtained and is otherwise negative. ? ?  ?PHYSICAL EXAM:  ?Wt Readings from Last 3 Encounters:  ?12/17/21 129 lb (58.5 kg)  ?12/11/21 129 lb 6.4 oz (58.7 kg)  ?11/27/21 136 lb 3.2 oz (61.8 kg)  ? ?Temp Readings from Last 3 Encounters:  ?12/11/21 98.1 ?F (36.7 ?C)  ?11/27/21 98.3 ?F (36.8 ?C)  ?11/13/21 97.7 ?F (36.5 ?C) (Oral)  ? ?BP Readings from Last 3 Encounters:  ?12/11/21 (!) 144/109  ?11/27/21 123/84  ?11/13/21 (!) 157/98  ? ?Pulse Readings from Last 3 Encounters:  ?12/11/21 (!) 139  ?11/27/21 Marland Kitchen)  116  ?11/13/21 (!) 109  ? ? /10 ? ?Unable to assess due to telephone consult visit format. ? ? ?KPS = 40 ? ?100 - Normal; no complaints; no evidence of disease. ?90   - Able to carry on normal activity; minor signs or symptoms of disease. ?80   - Normal activity with effort; some signs or symptoms of disease. ?45   - Cares for self; unable to carry on normal activity or to do active work. ?60   - Requires occasional assistance, but is able to care for most of his personal needs. ?50   - Requires considerable assistance and frequent medical care. ?89   - Disabled; requires special care and assistance. ?30   - Severely disabled; hospital admission is indicated although death not imminent. ?20   - Very sick; hospital admission necessary; active supportive treatment  necessary. ?10   - Moribund; fatal processes progressing rapidly. ?0     - Dead ? ?Karnofsky DA, Abelmann WH, Craver LS and [facility] [phone]) The use of the nitrogen mustards in the palliative treatment of carcinoma: with part

## 2021-12-17 NOTE — Progress Notes (Signed)
Location/Histology of Brain Tumor: Left frontoparietal lobe with mid right-sided neurological deficits ? ?Associated problem:  Small cell lung Ca ? ?Past or anticipated interventions, if any, per neurosurgery:  ? ?09/05/2021  Brain Lesion ?Dr. James Ivanoff, MD ? ?FINDINGS:  ?There is a large complex cystic lesion centered within the left frontoparietal region, involving both the pre and postcentral gyri, measuring approximately 4.8 x 3.7 x 5.1 cm (AP x TV x CC) (14:18 and 7:17). There is thin rim of peripheral enhancement and internal enhancing loculations. There is only minimal adjacent vasogenic edema, suggesting a more chronic process. No ventriculomegaly. There is approximately 5 mm of rightward midline shift. There is no restricted diffusion. No evidence of intracranial hemorrhage. There are some T2/FLAIR signal abnormalities throughout the deep cerebral white matter likely chronic small vessel ischemic changes.  ? ?IMPRESSION: ?-- There is a large complex cystic lesion centered within the left frontoparietal region. Metastasis is the primary differential. Associated mass effect with 5 mm of rightward midline shift. ? ? ?Past or anticipated interventions, if any, per medical oncology: Completed 4 cycles chemoradiation of cisplatin D1 +Etoposide D1-3 (October 2021). ? ?Dose of Decadron, if applicable: Decadron 4 mg twice daily. ? ?Recent neurologic symptoms, if any:  ?Seizures: No ?Headaches: Yes x 3 since December 2022 ?Nausea: No ?Dizziness/ataxia: No ?Difficulty with hand coordination: right side ?Focal numbness/weakness: right side ?Visual deficits/changes: No ?Confusion/Memory deficits: Yes ? ?Painful bone metastases at present, if any:  No ? ?SAFETY ISSUES: ?Prior radiation?  Yes, whole brain Jan. 2023 10 treatments ?Pacemaker/ICD? No ?Possible current pregnancy? Male  ?Is the patient on methotrexate? No ? ?Additional Complaints / other details: 30 lb wt. Loss over 30 day period despite taking Megace daily.  Uses  wheelchair for mobility needs. Right chest port-a-cath.   ?

## 2021-12-18 ENCOUNTER — Telehealth: Payer: Self-pay | Admitting: Radiation Therapy

## 2021-12-18 ENCOUNTER — Other Ambulatory Visit: Payer: Self-pay | Admitting: Radiation Therapy

## 2021-12-18 DIAGNOSIS — G9389 Other specified disorders of brain: Secondary | ICD-10-CM | POA: Insufficient documentation

## 2021-12-18 NOTE — Telephone Encounter (Signed)
Spoke with Ms. Featherly about the appointment for her husband to meet with Dr. Zada Finders on 3/24 @ 11:15 to discuss aspiration of the large cystic mass causing him CNS symptoms. She was very happy for the call and plans to have him there as scheduled.  ? ?Mont Dutton R.T.(R)(T) ?Radiation Special Procedures Navigator  ?

## 2021-12-18 NOTE — Progress Notes (Signed)
Referred pt to Dr. Emelda Brothers at [facility], for consideration of aspirating his large cystic mass causing CNS symptoms. He is scheduled to meet with Dr. Zada Finders on Friday 3/24 @ 11:15.  ? ?Mont Dutton R.T.(R)(T) ?Radiation Special Procedures Navigator ?

## 2021-12-19 ENCOUNTER — Other Ambulatory Visit: Payer: Medicaid Other

## 2021-12-20 ENCOUNTER — Ambulatory Visit: Payer: Medicaid Other | Admitting: Radiation Oncology

## 2021-12-20 ENCOUNTER — Ambulatory Visit: Payer: Medicaid Other

## 2021-12-24 ENCOUNTER — Other Ambulatory Visit: Payer: Self-pay | Admitting: Neurological Surgery

## 2021-12-24 ENCOUNTER — Other Ambulatory Visit: Payer: Self-pay | Admitting: Radiation Therapy

## 2021-12-26 ENCOUNTER — Ambulatory Visit: Payer: Medicaid Other | Admitting: Radiation Oncology

## 2022-01-01 NOTE — Progress Notes (Signed)
Surgical Instructions ? ? ? Your procedure is scheduled on Wednesday, April 12th, 2023. ? ? Report to [facility] Main Entrance "A" at 06:30 A.M., then check in with the Admitting office. ? Call this number if you have problems the morning of surgery: ? [phone] ? ? If you have any questions prior to your surgery date call [phone]: Open Monday-Friday 8am-4pm ? ? ? Remember: ? Do not eat after midnight the night before your surgery ? ?You may drink clear liquids until 05:30 the morning of your surgery.   ?Clear liquids allowed are: Water, Non-Citrus Juices (without pulp), Carbonated Beverages, Clear Tea, Black Coffee ONLY (NO MILK, CREAM OR POWDERED CREAMER of any kind), and Gatorade ?  ? Take these medicines the morning of surgery with A SIP OF WATER:  ? ?dexamethasone (DECADRON)  ?megestrol (MEGACE) ? ?acetaminophen (TYLENOL) - if needed ? ? ?As of today, STOP taking any Aspirin (unless otherwise instructed by your surgeon) Aleve, Naproxen, Ibuprofen, Motrin, Advil, Goody's, BC's, all herbal medications, fish oil, and all vitamins. ? ? ? The day of surgery: ?         ?Do not wear jewelry  ?Do not wear lotions, powders, olognes, or deodorant. ?Men may shave face and neck. ?Do not bring valuables to the hospital. ? ? ?[facility] is not responsible for any belongings or valuables. .  ? ?Do NOT Smoke (Tobacco/Vaping)  24 hours prior to your procedure ? ?If you use a CPAP at night, you may bring your mask for your overnight stay. ?  ?Contacts, glasses, hearing aids, dentures or partials may not be worn into surgery, please bring cases for these belongings ?  ?For patients admitted to the hospital, discharge time will be determined by your treatment team. ?  ?Patients discharged the day of surgery will not be allowed to drive home, and someone needs to stay with them for 24 hours. ? ? ?SURGICAL WAITING ROOM VISITATION ?Patients having surgery or a procedure in a hospital may have two support people. ?Children  under the age of 63 must have an adult with them who is not the patient. ?They may stay in the waiting area during the procedure and may switch out with other visitors. If the patient needs to stay at the hospital during part of their recovery, the visitor guidelines for inpatient rooms apply. ? ?Please refer to the [place] website for the visitor guidelines for Inpatients (after your surgery is over and you are in a regular room).  ? ? ?Special instructions:   ? ?Oral Hygiene is also important to reduce your risk of infection.  Remember - BRUSH YOUR TEETH THE MORNING OF SURGERY WITH YOUR REGULAR TOOTHPASTE ? ? ?- Preparing For Surgery ? ?Before surgery, you can play an important role. Because skin is not sterile, your skin needs to be as free of germs as possible. You can reduce the number of germs on your skin by washing with CHG (chlorahexidine gluconate) Soap before surgery.  CHG is an antiseptic cleaner which kills germs and bonds with the skin to continue killing germs even after washing.   ? ? ?Please do not use if you have an allergy to CHG or antibacterial soaps. If your skin becomes reddened/irritated stop using the CHG.  ?Do not shave (including legs and underarms) for at least 48 hours prior to first CHG shower. It is OK to shave your face. ? ?Please follow these instructions carefully. ?  ? ? Shower the [employer] BEFORE SURGERY  and the MORNING OF SURGERY with CHG Soap.  ? If you chose to wash your hair, wash your hair first as usual with your normal shampoo. After you shampoo, rinse your hair and body thoroughly to remove the shampoo.  Then [employer] and genitals (private parts) with your normal soap and rinse thoroughly to remove soap. ? ?After that Use CHG Soap as you would any other liquid soap. You can apply CHG directly to the skin and wash gently with a scrungie or a clean washcloth.  ? ?Apply the CHG Soap to your body ONLY FROM THE NECK DOWN.  Do not use on open wounds or open sores.  Avoid contact with your eyes, ears, mouth and genitals (private parts). Wash Face and genitals (private parts)  with your normal soap.  ? ?Wash thoroughly, paying special attention to the area where your surgery will be performed. ? ?Thoroughly rinse your body with warm water from the neck down. ? ?DO NOT shower/wash with your normal soap after using and rinsing off the CHG Soap. ? ?Pat yourself dry with a CLEAN TOWEL. ? ?Wear CLEAN PAJAMAS to bed the night before surgery ? ?Place CLEAN SHEETS on your bed the night before your surgery ? ?DO NOT SLEEP WITH PETS. ? ? ?Day of Surgery: ? ?Take a shower with CHG soap. ?Wear Clean/Comfortable clothing the morning of surgery ?Do not apply any deodorants/lotions.   ?Remember to brush your teeth WITH YOUR REGULAR TOOTHPASTE. ? ? ? ?If you received a COVID test during your pre-op visit  it is requested that you wear a mask when out in public, stay away from anyone that may not be feeling well and notify your surgeon if you develop symptoms. If you have been in contact with anyone that has tested positive in the last 10 days please notify you surgeon. ? ?  ?Please read over the following fact sheets that you were given.   ?

## 2022-01-02 ENCOUNTER — Encounter (HOSPITAL_COMMUNITY)
Admission: RE | Admit: 2022-01-02 | Discharge: 2022-01-02 | Disposition: A | Discharge status: Home / Self Care | Payer: Medicaid Other | Source: Ambulatory Visit | Attending: Neurological Surgery | Admitting: Neurological Surgery

## 2022-01-02 ENCOUNTER — Encounter (HOSPITAL_COMMUNITY): Payer: Self-pay

## 2022-01-02 ENCOUNTER — Other Ambulatory Visit: Payer: Self-pay

## 2022-01-02 VITALS — BP 135/96 | HR 119 | Temp 98.0°F | Resp 18 | Ht 68.0 in | Wt 140.0 lb

## 2022-01-02 DIAGNOSIS — Z01818 Encounter for other preprocedural examination: Secondary | ICD-10-CM

## 2022-01-02 DIAGNOSIS — Z01812 Encounter for preprocedural laboratory examination: Secondary | ICD-10-CM | POA: Diagnosis present

## 2022-01-02 LAB — TYPE AND SCREEN
ABO/RH(D): O POS
Antibody Screen: NEGATIVE

## 2022-01-02 LAB — CBC
HCT: 44.7 % (ref 39.0–52.0)
Hemoglobin: 14.6 g/dL (ref 13.0–17.0)
MCH: 29 pg (ref 26.0–34.0)
MCHC: 32.7 g/dL (ref 30.0–36.0)
MCV: 88.7 fL (ref 80.0–100.0)
Platelets: 203 10*3/uL (ref 150–400)
RBC: 5.04 MIL/uL (ref 4.22–5.81)
RDW: 18.8 % — ABNORMAL HIGH (ref 11.5–15.5)
WBC: 14.7 10*3/uL — ABNORMAL HIGH (ref 4.0–10.5)
nRBC: 1.2 % — ABNORMAL HIGH (ref 0.0–0.2)

## 2022-01-02 NOTE — Progress Notes (Signed)
PCP - Dr. Nelda Bucks ?Cardiologist - Denies ?Radiation Oncologist: Dr. Tyler Pita ? ?PPM/ICD - Denies ? ?Chest x-ray - N/A ?EKG - N/A ?Stress Test - Denies ?ECHO - Denies ?Cardiac Cath - Denies ? ?Sleep Study - Denies ? ?Patient denies having diabetes. ? ?Blood Thinner Instructions: N/A ?Aspirin Instructions: N/A ? ?ERAS Protcol - Yes ?PRE-SURGERY Ensure or G2- No ? ?COVID TEST- N/A ? ? ?Anesthesia review: No ? ?Patient denies shortness of breath, fever, cough and chest pain at PAT appointment ? ? ?All instructions explained to the patient, with a verbal understanding of the material. Patient agrees to go over the instructions while at home for a better understanding. Patient also instructed to self quarantine after being tested for COVID-19. The opportunity to ask questions was provided. ? ? ?

## 2022-01-07 NOTE — Anesthesia Preprocedure Evaluation (Addendum)
Anesthesia Evaluation  ?Patient identified by MRN, date of birth, ID band ?Patient awake ? ? ? ?Reviewed: ?Allergy & Precautions, NPO status , Patient's Chart, lab work & pertinent test results ? ?Airway ?Mallampati: II ? ?TM Distance: >3 FB ? ? ? ? Dental ?  ?Pulmonary ?Current Smoker and Patient abstained from smoking.,  ?  ?breath sounds clear to auscultation ? ? ? ? ? ? Cardiovascular ?negative cardio ROS ? ? ?Rhythm:Regular Rate:Normal ? ? ?  ?Neuro/Psych ?History noted ?Dr. Nyoka Cowden ?  ? GI/Hepatic ?negative GI ROS, Neg liver ROS,   ?Endo/Other  ?negative endocrine ROS ? Renal/GU ?negative Renal ROS  ? ?  ?Musculoskeletal ? ? Abdominal ?  ?Peds ? Hematology ?  ?Anesthesia Other Findings ? ? Reproductive/Obstetrics ? ?  ? ? ? ? ? ? ? ? ? ? ? ? ? ?  ?  ? ? ? ? ? ? ? ?Anesthesia Physical ?Anesthesia Plan ? ?ASA: 3 ? ?Anesthesia Plan: General  ? ?Post-op Pain Management:   ? ?Induction: Intravenous ? ?PONV Risk Score and Plan: Ondansetron, Dexamethasone and Midazolam ? ?Airway Management Planned: Oral ETT ? ?Additional Equipment:  ? ?Intra-op Plan:  ? ?Post-operative Plan: Possible Post-op intubation/ventilation ? ?Informed Consent: I have reviewed the patients History and Physical, chart, labs and discussed the procedure including the risks, benefits and alternatives for the proposed anesthesia with the patient or authorized representative who has indicated his/her understanding and acceptance.  ? ? ? ?Dental advisory given ? ?Plan Discussed with: Anesthesiologist and CRNA ? ?Anesthesia Plan Comments:   ? ? ? ? ? ?Anesthesia Quick Evaluation ? ?

## 2022-01-08 ENCOUNTER — Inpatient Hospital Stay (HOSPITAL_COMMUNITY): Payer: Medicaid Other | Admitting: Physician Assistant

## 2022-01-08 ENCOUNTER — Encounter (HOSPITAL_COMMUNITY)
Admission: RE | Disposition: A | Discharge status: Home / Self Care | Payer: Self-pay | Source: Home / Self Care | Attending: Neurological Surgery

## 2022-01-08 ENCOUNTER — Other Ambulatory Visit: Payer: Self-pay | Admitting: Radiation Therapy

## 2022-01-08 ENCOUNTER — Encounter (HOSPITAL_COMMUNITY): Payer: Self-pay | Admitting: Neurological Surgery

## 2022-01-08 ENCOUNTER — Other Ambulatory Visit: Payer: Self-pay

## 2022-01-08 ENCOUNTER — Inpatient Hospital Stay (HOSPITAL_COMMUNITY)
Admission: RE | Admit: 2022-01-08 | Discharge: 2022-01-09 | DRG: 027 | Disposition: A | Discharge status: Home / Self Care | Payer: Medicaid Other | Attending: Neurological Surgery | Admitting: Neurological Surgery

## 2022-01-08 ENCOUNTER — Inpatient Hospital Stay (HOSPITAL_COMMUNITY): Payer: Medicaid Other

## 2022-01-08 ENCOUNTER — Inpatient Hospital Stay (HOSPITAL_COMMUNITY): Payer: Medicaid Other | Admitting: General Practice

## 2022-01-08 DIAGNOSIS — F17210 Nicotine dependence, cigarettes, uncomplicated: Secondary | ICD-10-CM | POA: Diagnosis present

## 2022-01-08 DIAGNOSIS — Z8529 Personal history of malignant neoplasm of other respiratory and intrathoracic organs: Secondary | ICD-10-CM

## 2022-01-08 DIAGNOSIS — C7931 Secondary malignant neoplasm of brain: Principal | ICD-10-CM | POA: Diagnosis present

## 2022-01-08 DIAGNOSIS — Z9889 Other specified postprocedural states: Principal | ICD-10-CM

## 2022-01-08 DIAGNOSIS — Z85118 Personal history of other malignant neoplasm of bronchus and lung: Secondary | ICD-10-CM | POA: Diagnosis not present

## 2022-01-08 DIAGNOSIS — G930 Cerebral cysts: Secondary | ICD-10-CM | POA: Diagnosis present

## 2022-01-08 DIAGNOSIS — C3490 Malignant neoplasm of unspecified part of unspecified bronchus or lung: Secondary | ICD-10-CM

## 2022-01-08 DIAGNOSIS — D496 Neoplasm of unspecified behavior of brain: Secondary | ICD-10-CM | POA: Diagnosis present

## 2022-01-08 HISTORY — PX: APPLICATION OF CRANIAL NAVIGATION: SHX6578

## 2022-01-08 HISTORY — PX: BURR HOLE: SHX908

## 2022-01-08 LAB — CBC
HCT: 39.9 % (ref 39.0–52.0)
Hemoglobin: 13 g/dL (ref 13.0–17.0)
MCH: 29.2 pg (ref 26.0–34.0)
MCHC: 32.6 g/dL (ref 30.0–36.0)
MCV: 89.7 fL (ref 80.0–100.0)
Platelets: 135 10*3/uL — ABNORMAL LOW (ref 150–400)
RBC: 4.45 MIL/uL (ref 4.22–5.81)
RDW: 18.4 % — ABNORMAL HIGH (ref 11.5–15.5)
WBC: 7.9 10*3/uL (ref 4.0–10.5)
nRBC: 1 % — ABNORMAL HIGH (ref 0.0–0.2)

## 2022-01-08 LAB — CREATININE, SERUM
Creatinine, Ser: 0.71 mg/dL (ref 0.61–1.24)
GFR, Estimated: >60 mL/min (ref >60)

## 2022-01-08 LAB — GLUCOSE, CAPILLARY: Glucose-Capillary: 108 mg/dL — ABNORMAL HIGH (ref 70–99)

## 2022-01-08 LAB — MRSA NEXT GEN BY PCR, NASAL: MRSA by PCR Next Gen: NOT DETECTED

## 2022-01-08 LAB — ABO/RH: ABO/RH(D): O POS

## 2022-01-08 IMAGING — MR MR HEAD WO/W CM
14 of 16 series · 40 of 48 positions shown · IV contrast (gadavist)
Comparison: Comparison made with most recent MRI from [DATE] as
well as previous exams.

CLINICAL DATA: Follow-up examination for brain/CNS neoplasm, assess
treatment response, status post Ommaya placement.

EXAM:
MRI HEAD WITHOUT AND WITH CONTRAST
TECHNIQUE: Multiplanar, multiecho pulse sequences of the brain and surrounding
structures were obtained without and with intravenous contrast.
CONTRAST:  6.5mL GADAVIST GADOBUTROL 1 MMOL/ML IV SOLN

[Series 5: DWI · axial · 3.0mm · 0.88mm/px · z∈[-61,+80]mm · 5 of 104 slices shown (1 of 4)]
[im 1/104]
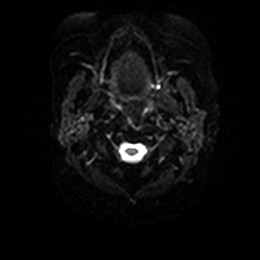
[im 26/104]
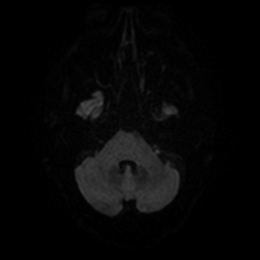
[im 52/104]
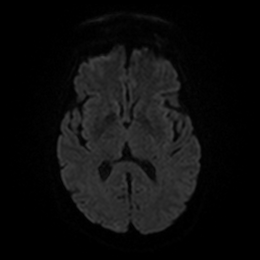
[im 78/104]
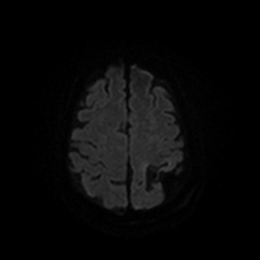
[im 104/104]
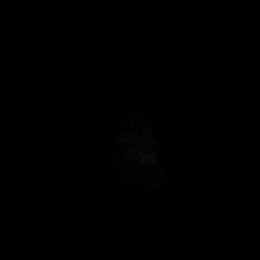

[Series 6: DWI · axial · 3.0mm · 0.88mm/px · z∈[-61,+80]mm · 2 of 52 slices shown (2 of 4)]
[im 1/52]
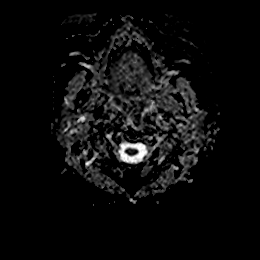
[im 52/52]
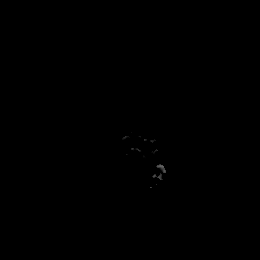

[Series 7: DWI · coronal · 4.0mm · 0.88mm/px · 4 of 76 slices shown (3 of 4)]
[im 1/76]
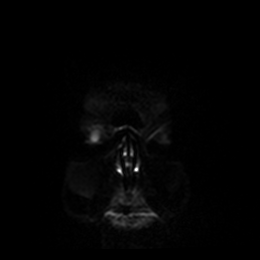
[im 26/76]
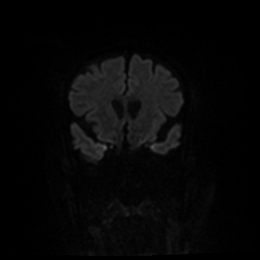
[im 51/76]
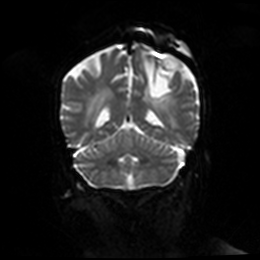
[im 76/76]
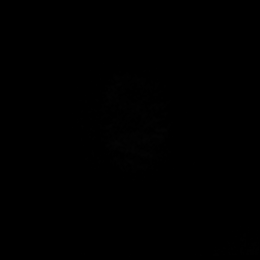

[Series 8: DWI · coronal · 4.0mm · 0.88mm/px · 2 of 37 slices shown (4 of 4)]
[im 1/37]
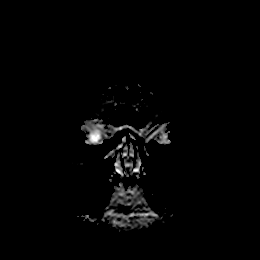
[im 37/37]
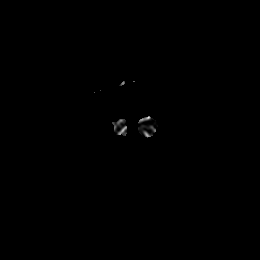

[Series 9: T1 · sagittal · 5.0mm · 0.75mm/px · 2 of 25 slices shown]
[im 1/25]
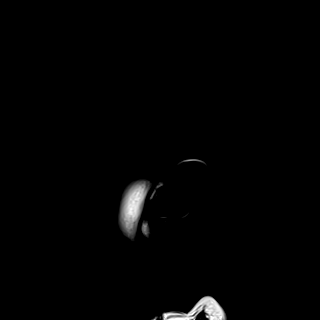
[im 25/25]
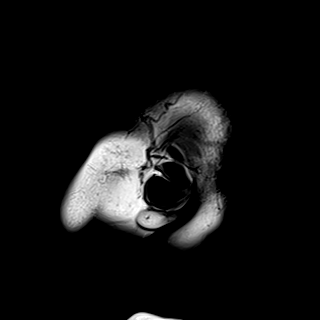

[Series 10: T2 · axial · 5.0mm · 0.72mm/px · z∈[-61,+83]mm · 2 of 27 slices shown]
[im 1/27]
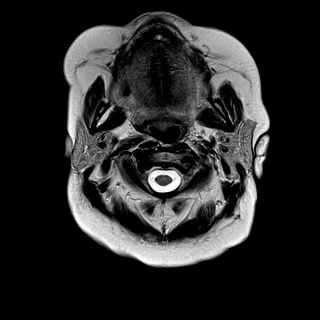
[im 27/27]
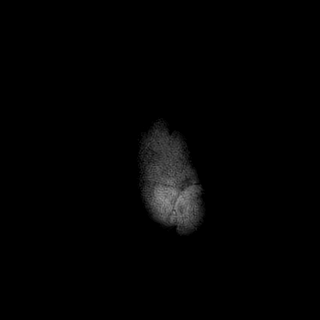

[Series 11: FLAIR · axial · 5.0mm · 0.45mm/px · z∈[-63,+80]mm · 2 of 27 slices shown]
[im 1/27]
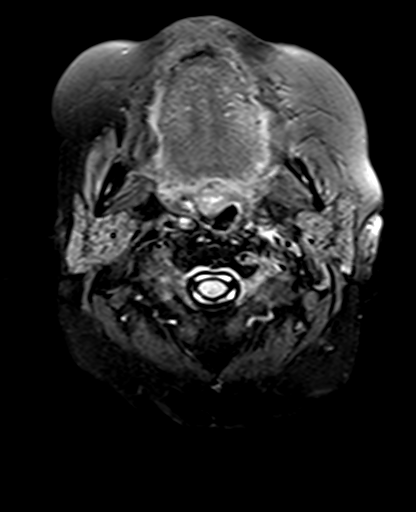
[im 27/27]
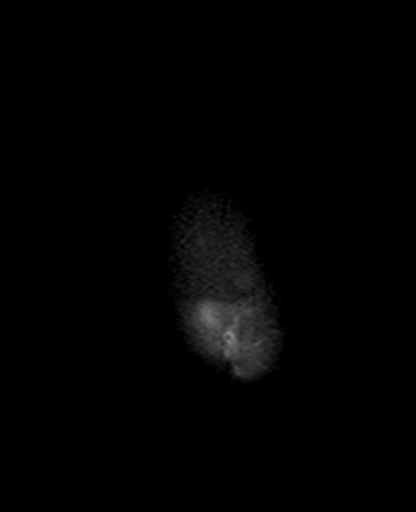

[Series 12: mag_images · axial · 3.0mm · 0.90mm/px · z∈[-79,+87]mm · 4 of 60 slices shown]
[im 1/60]
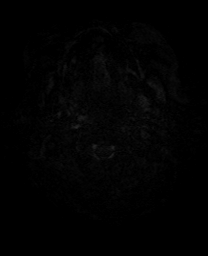
[im 20/60]
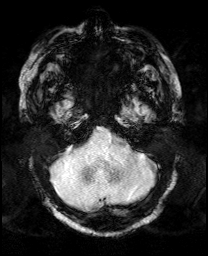
[im 40/60]
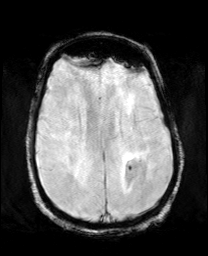
[im 60/60]
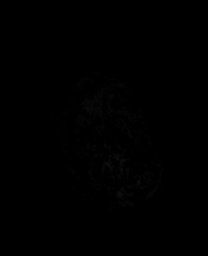

[Series 13: pha_images · axial · 3.0mm · 0.90mm/px · z∈[-76,+87]mm · 4 of 59 slices shown]
[im 1/59]
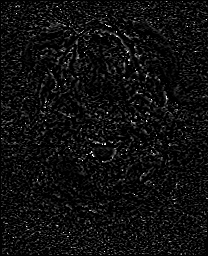
[im 20/59]
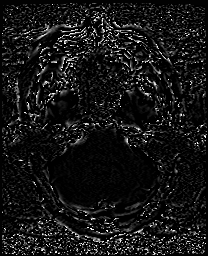
[im 39/59]
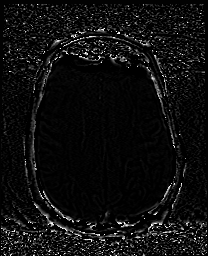
[im 59/59]
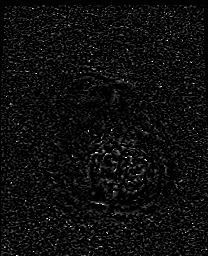

[Series 14: swi_images · axial · 3.0mm · 0.90mm/px · z∈[-79,+87]mm · 4 of 60 slices shown]
[im 1/60]
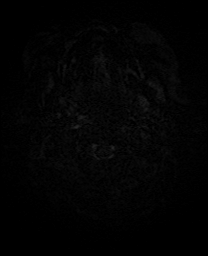
[im 20/60]
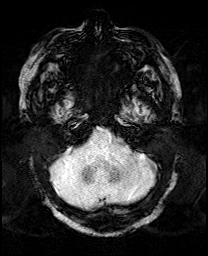
[im 40/60]
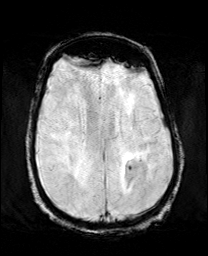
[im 60/60]
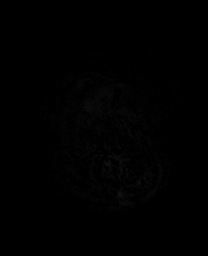

[Series 15: mip_images(sw) · axial · 24.0mm · 0.90mm/px · z∈[-69,+77]mm · 3 of 53 slices shown]
[im 1/53]
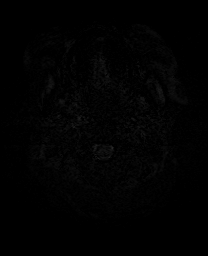
[im 27/53]
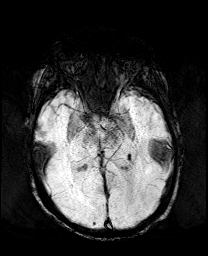
[im 53/53]
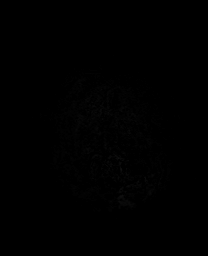

[Series 17: T2 post-contrast · coronal · 5.0mm · 0.72mm/px · 2 of 33 slices shown]
[im 1/33]
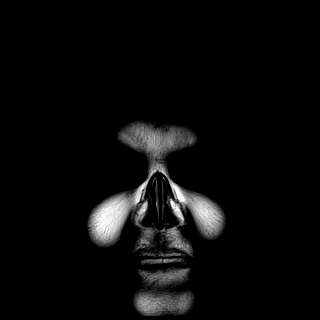
[im 33/33]
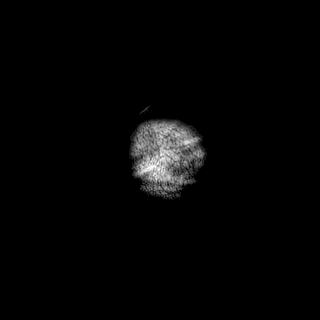

[Series 19: T1 post-contrast · coronal · 5.0mm · 0.34mm/px · 2 of 33 slices shown (1 of 2)]
[im 1/33]
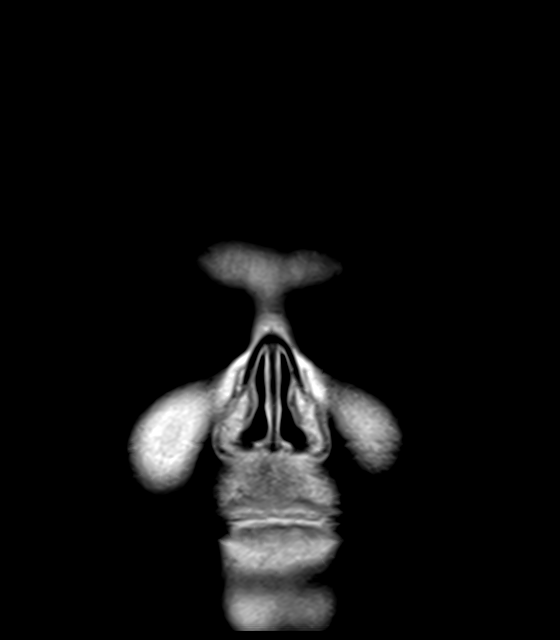
[im 33/33]
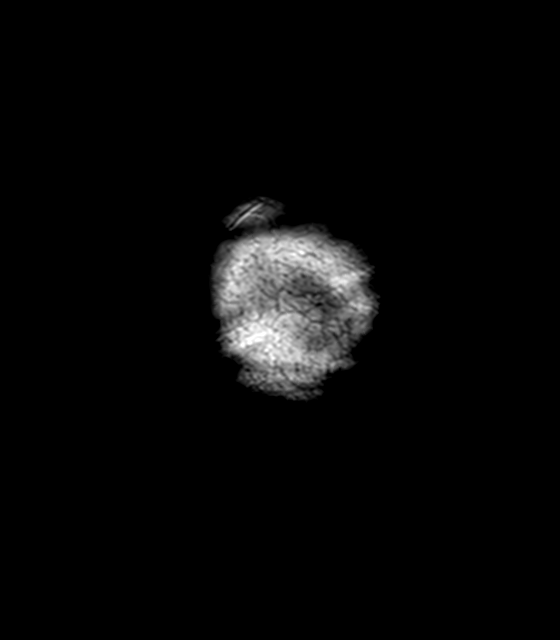

[Series 20: T1 post-contrast · sagittal · 5.0mm · 0.72mm/px · 2 of 25 slices shown (2 of 2)]
[im 1/25]
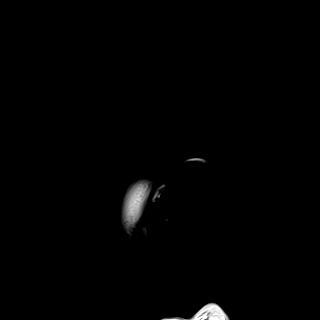
[im 25/25]
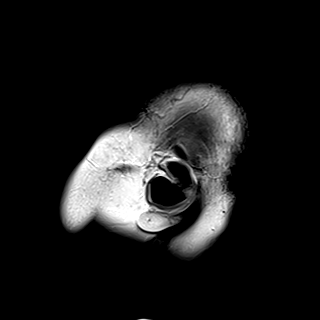

[40 of 48 positions shown; findings below may reference images not displayed]

FINDINGS: Brain: Postoperative changes from interval left parietal craniotomy
are seen. Postoperative pneumocephalus seen near the craniotomy site
and overlying the anterior frontal lobes bilaterally. There has been
partial drainage of the left parietal cystic mass, which is now
partially collapsed [REDACTED]reased in size from previous. Lesion now
measures approximately 3.5 x 2.5 x 4.7 cm. Additionally, there has
been interval placement of an Ommaya reservoir or with catheter seen
coursing inferiorly towards the left thalamocapsular region. No
complicating features.

There is a new 7 mm rim enhancing lesion involving the subcortical
white matter of the right occipital lobe, consistent with a new
metastasis (series 18, image 35). An additional new 4 mm metastatic
deposit seen at the right occipital pole (series 18, image 33).
Small amount of associated susceptibility artifact about these
lesions consistent with blood products/necrosis. An additional
possible third new metastatic lesion measuring 3 mm noted at the
right pons/middle cerebellar peduncle (series 18, image 20). No
significant edema about these lesions.

Markedly progress T2/FLAIR signal intensity seen throughout the
cerebral white matter and pons, likely reflecting changes of
interval whole-brain radiation.

No evidence for acute or subacute infarct. Gray-white matter
differentiation otherwise maintained. No other acute or chronic
intracranial blood products.

Vascular: Major intracranial vascular flow voids are well
maintained.

Skull and upper cervical spine: Craniocervical junction normal. Bone
marrow signal intensity within normal limits. No focal marrow
replacing lesion. Post craniotomy changes at the left parietal
calvarium and scalp without adverse features. Ommaya reservoir in
place.

Sinuses/Orbits: Globes and orbital soft tissues demonstrate no acute
finding. Paranasal sinuses are clear. No mastoid effusion.

Other: None.
IMPRESSION: 1. Postoperative changes from interval left parietal craniotomy with
Ommaya reservoir placement and partial drainage of the left parietal
cystic mass, which is now partially collapsed and decreased in size
measuring 3.5 x 2.5 x 4.7 cm.
2. At least two new metastatic deposits measuring up to 7 mm
involving the right occipital lobe as above. Possible additional 3
mm lesion at the right pons/middle cerebellar peduncle as above.
Findings consistent with interval progression of disease.
3. Markedly progressive T2/FLAIR signal intensity throughout the
cerebral white matter and pons, likely reflecting changes of
interval whole-brain radiation.

## 2022-01-08 SURGERY — CREATION, CRANIAL BURR HOLE
Anesthesia: General

## 2022-01-08 MED ORDER — LIDOCAINE-EPINEPHRINE 1 %-1:100000 IJ SOLN
INTRAMUSCULAR | Status: AC
  Filled 2022-01-08: qty 1

## 2022-01-08 MED ORDER — POLYETHYLENE GLYCOL 3350 17 G PO PACK: 17.0000 g | PACK | Freq: Every day | ORAL | Status: DC | PRN

## 2022-01-08 MED ORDER — DOCUSATE SODIUM 100 MG PO CAPS
100.0000 mg | ORAL_CAPSULE | Freq: Two times a day (BID) | ORAL | Status: DC
  Administered 2022-01-08 – 2022-01-09 (×3): 100 mg via ORAL
  Filled 2022-01-08 (×3): qty 1

## 2022-01-08 MED ORDER — SODIUM CHLORIDE 0.9 % IV SOLN: INTRAVENOUS | Status: DC

## 2022-01-08 MED ORDER — ACETAMINOPHEN 325 MG PO TABS
650.0000 mg | ORAL_TABLET | ORAL | Status: DC | PRN
  Administered 2022-01-09: 650 mg via ORAL
  Filled 2022-01-08: qty 2

## 2022-01-08 MED ORDER — GADOBUTROL 1 MMOL/ML IV SOLN
6.5000 mL | Freq: Once | INTRAVENOUS | Status: AC | PRN
  Administered 2022-01-08: 6.5 mL via INTRAVENOUS

## 2022-01-08 MED ORDER — 0.9 % SODIUM CHLORIDE (POUR BTL) OPTIME
TOPICAL | Status: DC | PRN
  Administered 2022-01-08: 1000 mL

## 2022-01-08 MED ORDER — CHLORHEXIDINE GLUCONATE CLOTH 2 % EX PADS: 6.0000 | MEDICATED_PAD | Freq: Once | CUTANEOUS | Status: DC

## 2022-01-08 MED ORDER — ACETAMINOPHEN 650 MG RE SUPP
650.0000 mg | RECTAL | Status: DC | PRN
Start: 2022-01-08 — End: 2022-01-09

## 2022-01-08 MED ORDER — ROCURONIUM BROMIDE 10 MG/ML (PF) SYRINGE
PREFILLED_SYRINGE | INTRAVENOUS | Status: DC | PRN
  Administered 2022-01-08: 30 mg via INTRAVENOUS
  Administered 2022-01-08: 50 mg via INTRAVENOUS

## 2022-01-08 MED ORDER — CEFAZOLIN SODIUM-DEXTROSE 2-4 GM/100ML-% IV SOLN
2.0000 g | Freq: Three times a day (TID) | INTRAVENOUS | Status: AC
  Administered 2022-01-08 – 2022-01-09 (×2): 2 g via INTRAVENOUS
  Filled 2022-01-08 (×2): qty 100

## 2022-01-08 MED ORDER — LABETALOL HCL 5 MG/ML IV SOLN
INTRAVENOUS | Status: AC
  Administered 2022-01-08: 10 mg via INTRAVENOUS
  Filled 2022-01-08: qty 4

## 2022-01-08 MED ORDER — ONDANSETRON HCL 4 MG/2ML IJ SOLN
INTRAMUSCULAR | Status: DC | PRN
  Administered 2022-01-08: 4 mg via INTRAVENOUS

## 2022-01-08 MED ORDER — SUGAMMADEX SODIUM 200 MG/2ML IV SOLN
INTRAVENOUS | Status: DC | PRN
  Administered 2022-01-08: 200 mg via INTRAVENOUS

## 2022-01-08 MED ORDER — PROMETHAZINE HCL 25 MG PO TABS: 12.5000 mg | ORAL_TABLET | ORAL | Status: DC | PRN

## 2022-01-08 MED ORDER — PHENYLEPHRINE HCL-NACL 20-0.9 MG/250ML-% IV SOLN
INTRAVENOUS | Status: DC | PRN
  Administered 2022-01-08: 40 ug/min via INTRAVENOUS

## 2022-01-08 MED ORDER — FENTANYL CITRATE (PF) 100 MCG/2ML IJ SOLN: 25.0000 ug | INTRAMUSCULAR | Status: DC | PRN

## 2022-01-08 MED ORDER — ORAL CARE MOUTH RINSE: 15.0000 mL | Freq: Once | OROMUCOSAL | Status: AC

## 2022-01-08 MED ORDER — ROCURONIUM BROMIDE 10 MG/ML (PF) SYRINGE
PREFILLED_SYRINGE | INTRAVENOUS | Status: AC
  Filled 2022-01-08: qty 10

## 2022-01-08 MED ORDER — LACTATED RINGERS IV SOLN: INTRAVENOUS | Status: DC

## 2022-01-08 MED ORDER — HYDROMORPHONE HCL 1 MG/ML IJ SOLN: 0.5000 mg | INTRAMUSCULAR | Status: DC | PRN

## 2022-01-08 MED ORDER — LIDOCAINE-EPINEPHRINE 1 %-1:100000 IJ SOLN
INTRAMUSCULAR | Status: DC | PRN
  Administered 2022-01-08: 8 mL

## 2022-01-08 MED ORDER — PHENYLEPHRINE 40 MCG/ML (10ML) SYRINGE FOR IV PUSH (FOR BLOOD PRESSURE SUPPORT)
PREFILLED_SYRINGE | INTRAVENOUS | Status: AC
  Filled 2022-01-08: qty 10

## 2022-01-08 MED ORDER — FENTANYL CITRATE (PF) 100 MCG/2ML IJ SOLN
25.0000 ug | INTRAMUSCULAR | Status: DC | PRN
  Administered 2022-01-08: 50 ug via INTRAVENOUS

## 2022-01-08 MED ORDER — CHLORHEXIDINE GLUCONATE 0.12 % MT SOLN
15.0000 mL | Freq: Once | OROMUCOSAL | Status: AC
  Administered 2022-01-08: 15 mL via OROMUCOSAL
  Filled 2022-01-08: qty 15

## 2022-01-08 MED ORDER — FENTANYL CITRATE (PF) 250 MCG/5ML IJ SOLN
INTRAMUSCULAR | Status: AC
  Filled 2022-01-08: qty 5

## 2022-01-08 MED ORDER — FENTANYL CITRATE (PF) 100 MCG/2ML IJ SOLN
INTRAMUSCULAR | Status: AC
  Filled 2022-01-08: qty 2

## 2022-01-08 MED ORDER — PHENYLEPHRINE 40 MCG/ML (10ML) SYRINGE FOR IV PUSH (FOR BLOOD PRESSURE SUPPORT)
PREFILLED_SYRINGE | INTRAVENOUS | Status: DC | PRN
  Administered 2022-01-08 (×2): 80 ug via INTRAVENOUS
  Administered 2022-01-08: 120 ug via INTRAVENOUS
  Administered 2022-01-08: 80 ug via INTRAVENOUS

## 2022-01-08 MED ORDER — DEXAMETHASONE 4 MG PO TABS
4.0000 mg | ORAL_TABLET | Freq: Two times a day (BID) | ORAL | Status: DC
  Administered 2022-01-08 – 2022-01-09 (×3): 4 mg via ORAL
  Filled 2022-01-08 (×3): qty 1

## 2022-01-08 MED ORDER — FENTANYL CITRATE (PF) 250 MCG/5ML IJ SOLN
INTRAMUSCULAR | Status: DC | PRN
  Administered 2022-01-08: 100 ug via INTRAVENOUS
  Administered 2022-01-08: 50 ug via INTRAVENOUS

## 2022-01-08 MED ORDER — LABETALOL HCL 5 MG/ML IV SOLN
10.0000 mg | INTRAVENOUS | Status: DC | PRN
  Administered 2022-01-08: 10 mg via INTRAVENOUS
  Filled 2022-01-08: qty 4

## 2022-01-08 MED ORDER — ONDANSETRON HCL 4 MG/2ML IJ SOLN
INTRAMUSCULAR | Status: AC
  Filled 2022-01-08: qty 2

## 2022-01-08 MED ORDER — HYDRALAZINE HCL 20 MG/ML IJ SOLN
10.0000 mg | INTRAMUSCULAR | Status: DC | PRN
  Administered 2022-01-08: 10 mg via INTRAVENOUS
  Filled 2022-01-08: qty 1

## 2022-01-08 MED ORDER — HEPARIN SODIUM (PORCINE) 5000 UNIT/ML IJ SOLN: 5000.0000 [IU] | Freq: Three times a day (TID) | INTRAMUSCULAR | Status: DC

## 2022-01-08 MED ORDER — LABETALOL HCL 5 MG/ML IV SOLN: 10.0000 mg | INTRAVENOUS | Status: DC | PRN

## 2022-01-08 MED ORDER — BACITRACIN ZINC 500 UNIT/GM EX OINT
TOPICAL_OINTMENT | CUTANEOUS | Status: AC
  Filled 2022-01-08: qty 28.35

## 2022-01-08 MED ORDER — LIDOCAINE 2% (20 MG/ML) 5 ML SYRINGE
INTRAMUSCULAR | Status: AC
  Filled 2022-01-08: qty 5

## 2022-01-08 MED ORDER — ONDANSETRON HCL 4 MG PO TABS: 4.0000 mg | ORAL_TABLET | ORAL | Status: DC | PRN

## 2022-01-08 MED ORDER — CEFAZOLIN SODIUM-DEXTROSE 2-4 GM/100ML-% IV SOLN
2.0000 g | INTRAVENOUS | Status: AC
  Administered 2022-01-08: 2 g via INTRAVENOUS
  Filled 2022-01-08: qty 100

## 2022-01-08 MED ORDER — MEGESTROL ACETATE 40 MG PO TABS
40.0000 mg | ORAL_TABLET | Freq: Every day | ORAL | Status: DC
  Administered 2022-01-09: 40 mg via ORAL
  Filled 2022-01-08: qty 1

## 2022-01-08 MED ORDER — BACITRACIN ZINC 500 UNIT/GM EX OINT
TOPICAL_OINTMENT | CUTANEOUS | Status: DC | PRN
  Administered 2022-01-08: 1 via TOPICAL

## 2022-01-08 MED ORDER — ORAL CARE MOUTH RINSE: 15.0000 mL | Freq: Once | OROMUCOSAL | Status: DC

## 2022-01-08 MED ORDER — PROPOFOL 10 MG/ML IV BOLUS
INTRAVENOUS | Status: AC
  Filled 2022-01-08: qty 20

## 2022-01-08 MED ORDER — LIDOCAINE 2% (20 MG/ML) 5 ML SYRINGE
INTRAMUSCULAR | Status: DC | PRN
  Administered 2022-01-08: 80 mg via INTRAVENOUS

## 2022-01-08 MED ORDER — ONDANSETRON HCL 4 MG/2ML IJ SOLN: 4.0000 mg | INTRAMUSCULAR | Status: DC | PRN

## 2022-01-08 MED ORDER — THROMBIN 5000 UNITS EX SOLR
CUTANEOUS | Status: AC
  Filled 2022-01-08: qty 5000

## 2022-01-08 MED ORDER — CHLORHEXIDINE GLUCONATE 0.12 % MT SOLN: 15.0000 mL | Freq: Once | OROMUCOSAL | Status: DC

## 2022-01-08 MED ORDER — DEXAMETHASONE SODIUM PHOSPHATE 10 MG/ML IJ SOLN
INTRAMUSCULAR | Status: DC | PRN
Start: 2022-01-08 — End: 2022-01-08
  Administered 2022-01-08: 10 mg via INTRAVENOUS

## 2022-01-08 MED ORDER — DEXAMETHASONE SODIUM PHOSPHATE 10 MG/ML IJ SOLN
INTRAMUSCULAR | Status: AC
  Filled 2022-01-08: qty 1

## 2022-01-08 MED ORDER — LACTATED RINGERS IV SOLN: INTRAVENOUS | Status: DC | PRN

## 2022-01-08 MED ORDER — HYDROCODONE-ACETAMINOPHEN 5-325 MG PO TABS
1.0000 | ORAL_TABLET | ORAL | Status: DC | PRN
  Administered 2022-01-08 (×2): 1 via ORAL
  Filled 2022-01-08 (×2): qty 1

## 2022-01-08 MED ORDER — PROPOFOL 10 MG/ML IV BOLUS
INTRAVENOUS | Status: DC | PRN
  Administered 2022-01-08: 50 mg via INTRAVENOUS
  Administered 2022-01-08: 150 mg via INTRAVENOUS

## 2022-01-08 SURGICAL SUPPLY — 103 items
BAG COUNTER SPONGE SURGICOUNT (BAG) ×5 IMPLANT
BAND RUBBER #18 3X1/16 STRL (MISCELLANEOUS) IMPLANT
BENZOIN TINCTURE PRP APPL 2/3 (GAUZE/BANDAGES/DRESSINGS) IMPLANT
BLADE CLIPPER SURG (BLADE) ×4 IMPLANT
BLADE SAW GIGLI 16 STRL (MISCELLANEOUS) IMPLANT
BLADE SURG 15 STRL LF DISP TIS (BLADE) IMPLANT
BLADE SURG 15 STRL SS (BLADE)
BNDG GAUZE ELAST 4 BULKY (GAUZE/BANDAGES/DRESSINGS) IMPLANT
BNDG STRETCH 4X75 STRL LF (GAUZE/BANDAGES/DRESSINGS) IMPLANT
BUR ACORN 9.0 PRECISION (BURR) ×5 IMPLANT
BUR MATCHSTICK NEURO 3.0 LAGG (BURR) IMPLANT
BUR ROUND PRECISION 4.0 (BURR) IMPLANT
BUR SPIRAL ROUTER 2.3 (BUR) ×2 IMPLANT
CANISTER SUCT 3000ML PPV (MISCELLANEOUS) ×7 IMPLANT
CATH VENT SHUNT STD STR (CATHETERS) ×1 IMPLANT
CATH VENTRIC 35X38 W/TROCAR LG (CATHETERS) IMPLANT
CLIP VESOCCLUDE MED 6/CT (CLIP) IMPLANT
CNTNR URN SCR LID CUP LEK RST (MISCELLANEOUS) ×2 IMPLANT
CONT SPEC 4OZ STRL OR WHT (MISCELLANEOUS) ×1
COVER MAYO STAND STRL (DRAPES) IMPLANT
DECANTER SPIKE VIAL GLASS SM (MISCELLANEOUS) ×2 IMPLANT
DRAIN SUBARACHNOID (WOUND CARE) IMPLANT
DRAPE HALF SHEET 40X57 (DRAPES) ×3 IMPLANT
DRAPE MICROSCOPE LEICA (MISCELLANEOUS) IMPLANT
DRAPE NEUROLOGICAL W/INCISE (DRAPES) ×6 IMPLANT
DRAPE SHEET LG 3/4 BI-LAMINATE (DRAPES) ×3 IMPLANT
DRAPE STERI IOBAN 125X83 (DRAPES) IMPLANT
DRAPE SURG 17X23 STRL (DRAPES) IMPLANT
DRAPE WARM FLUID 44X44 (DRAPES) ×5 IMPLANT
DRSG ADAPTIC 3X8 NADH LF (GAUZE/BANDAGES/DRESSINGS) IMPLANT
DRSG TELFA 3X8 NADH (GAUZE/BANDAGES/DRESSINGS) IMPLANT
DURAPREP 6ML APPLICATOR 50/CS (WOUND CARE) ×5 IMPLANT
ELECT REM PT RETURN 9FT ADLT (ELECTROSURGICAL) ×3
ELECTRODE REM PT RTRN 9FT ADLT (ELECTROSURGICAL) ×4 IMPLANT
EVACUATOR 1/8 PVC DRAIN (DRAIN) IMPLANT
EVACUATOR SILICONE 100CC (DRAIN) IMPLANT
FORCEPS BIPOLAR SPETZLER 8 1.0 (NEUROSURGERY SUPPLIES) ×2 IMPLANT
GAUZE 4X4 16PLY ~~LOC~~+RFID DBL (SPONGE) IMPLANT
GAUZE SPONGE 4X4 12PLY STRL (GAUZE/BANDAGES/DRESSINGS) ×2 IMPLANT
GLOVE BIO SURGEON STRL SZ7 (GLOVE) IMPLANT
GLOVE BIOGEL PI IND STRL 7.0 (GLOVE) IMPLANT
GLOVE BIOGEL PI IND STRL 7.5 (GLOVE) ×6 IMPLANT
GLOVE BIOGEL PI INDICATOR 7.0 (GLOVE)
GLOVE BIOGEL PI INDICATOR 7.5 (GLOVE) ×2
GLOVE ECLIPSE 7.5 STRL STRAW (GLOVE) ×8 IMPLANT
GLOVE EXAM NITRILE LRG STRL (GLOVE) IMPLANT
GLOVE EXAM NITRILE XL STR (GLOVE) IMPLANT
GLOVE EXAM NITRILE XS STR PU (GLOVE) IMPLANT
GOWN STRL REUS W/ TWL LRG LVL3 (GOWN DISPOSABLE) ×8 IMPLANT
GOWN STRL REUS W/ TWL XL LVL3 (GOWN DISPOSABLE) IMPLANT
GOWN STRL REUS W/TWL 2XL LVL3 (GOWN DISPOSABLE) IMPLANT
GOWN STRL REUS W/TWL LRG LVL3 (GOWN DISPOSABLE) ×1
GOWN STRL REUS W/TWL XL LVL3 (GOWN DISPOSABLE) ×2
GUIDEWIRE STRAIGHT .035 260CM (WIRE) ×1 IMPLANT
HEMOSTAT POWDER KIT SURGIFOAM (HEMOSTASIS) ×4 IMPLANT
HEMOSTAT SURGICEL 2X14 (HEMOSTASIS) ×2 IMPLANT
HOOK DURA 1/2IN (MISCELLANEOUS) ×2 IMPLANT
IV NS 1000ML (IV SOLUTION)
IV NS 1000ML BAXH (IV SOLUTION) ×2 IMPLANT
KIT BASIN OR (CUSTOM PROCEDURE TRAY) ×5 IMPLANT
KIT DRAIN CSF ACCUDRAIN (MISCELLANEOUS) IMPLANT
KIT TURNOVER KIT B (KITS) ×5 IMPLANT
MARKER SPHERE PSV REFLC 13MM (MARKER) ×8 IMPLANT
NDL HYPO 25X1 1.5 SAFETY (NEEDLE) IMPLANT
NDL SPNL 18GX3.5 QUINCKE PK (NEEDLE) IMPLANT
NEEDLE HYPO 22GX1.5 SAFETY (NEEDLE) ×6 IMPLANT
NEEDLE HYPO 25X1 1.5 SAFETY (NEEDLE) ×3 IMPLANT
NEEDLE SPNL 18GX3.5 QUINCKE PK (NEEDLE) IMPLANT
NS IRRIG 1000ML POUR BTL (IV SOLUTION) ×9 IMPLANT
PACK CRANIOTOMY CUSTOM (CUSTOM PROCEDURE TRAY) ×5 IMPLANT
PAD DRESSING TELFA 3X8 NADH (GAUZE/BANDAGES/DRESSINGS) IMPLANT
PATTIES SURGICAL .25X.25 (GAUZE/BANDAGES/DRESSINGS) IMPLANT
PATTIES SURGICAL .5 X.5 (GAUZE/BANDAGES/DRESSINGS) IMPLANT
PATTIES SURGICAL .5 X3 (DISPOSABLE) IMPLANT
PATTIES SURGICAL 1/4 X 3 (GAUZE/BANDAGES/DRESSINGS) IMPLANT
PATTIES SURGICAL 1X1 (DISPOSABLE) IMPLANT
PIN MAYFIELD SKULL DISP (PIN) ×3 IMPLANT
RESERVOIR CSF FLAT BOTTOM OMMA (Shunt) ×1 IMPLANT
SPECIMEN JAR SMALL (MISCELLANEOUS) ×1 IMPLANT
SPONGE NEURO XRAY DETECT 1X3 (DISPOSABLE) IMPLANT
SPONGE SURGIFOAM ABS GEL 100 (HEMOSTASIS) ×2 IMPLANT
SPONGE SURGIFOAM ABS GEL SZ50 (HEMOSTASIS) ×2 IMPLANT
STAPLER VISISTAT 35W (STAPLE) ×5 IMPLANT
STOCKINETTE 6  STRL (DRAPES)
STOCKINETTE 6 STRL (DRAPES) IMPLANT
SUT ETHILON 3 0 FSL (SUTURE) IMPLANT
SUT ETHILON 3 0 PS 1 (SUTURE) IMPLANT
SUT MNCRL AB 3-0 PS2 18 (SUTURE) ×3 IMPLANT
SUT MON AB 3-0 SH 27 (SUTURE) ×1
SUT MON AB 3-0 SH27 (SUTURE) IMPLANT
SUT NURALON 4 0 TR CR/8 (SUTURE) ×9 IMPLANT
SUT SILK 0 TIES 10X30 (SUTURE) ×1 IMPLANT
SUT STEEL 0 (SUTURE)
SUT STEEL 0 18XMFL TIE 17 (SUTURE) IMPLANT
SUT VIC AB 0 CT1 18XCR BRD8 (SUTURE) ×2 IMPLANT
SUT VIC AB 0 CT1 8-18 (SUTURE)
SUT VIC AB 2-0 CP2 18 (SUTURE) ×3 IMPLANT
TOWEL GREEN STERILE (TOWEL DISPOSABLE) ×5 IMPLANT
TOWEL GREEN STERILE FF (TOWEL DISPOSABLE) ×5 IMPLANT
TRAY FOLEY MTR SLVR 16FR STAT (SET/KITS/TRAYS/PACK) ×4 IMPLANT
TUBE CONNECTING 12X1/4 (SUCTIONS) ×5 IMPLANT
UNDERPAD 30X36 HEAVY ABSORB (UNDERPADS AND DIAPERS) ×4 IMPLANT
WATER STERILE IRR 1000ML POUR (IV SOLUTION) ×5 IMPLANT

## 2022-01-08 NOTE — Anesthesia Procedure Notes (Signed)
Procedure Name: Intubation ?Date/Time: 01/08/2022 9:04 AM ?Performed by: Dorann Lodge, CRNA ?Pre-anesthesia Checklist: Patient identified, Emergency Drugs available, Suction available and Patient being monitored ?Patient Re-evaluated:Patient Re-evaluated prior to induction ?Oxygen Delivery Method: Circle System Utilized ?Preoxygenation: Pre-oxygenation with 100% oxygen ?Induction Type: IV induction ?Ventilation: Mask ventilation without difficulty ?Laryngoscope Size: Mac and 4 ?Grade View: Grade I ?Tube type: Oral ?Tube size: 7.5 mm ?Number of attempts: 1 ?Airway Equipment and Method: Stylet ?Placement Confirmation: ETT inserted through vocal cords under direct vision, positive ETCO2 and breath sounds checked- equal and bilateral ?Secured at: 23 cm ?Tube secured with: Tape ?Dental Injury: Teeth and Oropharynx as per pre-operative assessment  ? ? ? ? ?

## 2022-01-08 NOTE — Op Note (Addendum)
PATIENT: Rosann Auerbach ? ?DAY OF SURGERY: 01/08/22 ?  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  ?POST-OPERATIVE DIAGNOSIS:  Same ?  ?PROCEDURE:  Left burr hole, drainage of intracranial cyst, placement of Ommaya reservoir, use of frameless stereotactic navigation ?  ?SURGEON:  Surgeon(s) and Role: ?   Judith Part, MD - Primary ?  ?ANESTHESIA: ETGA ?  ?BRIEF HISTORY: This is a 66 year old man who presented with progressive right sided weakness. He has a history of SCLC that was treated with RT and was systemically doing well with regression of his pulmonary and intracranial disease, but had a persistent cyst in the left frontal region after treatment that caused persistent right sided weakness. We discussed options, I recommended drainage with placement of an Ommaya reservoir for drainage if it recurs. We discussed risks, benefits, expectations regarding outcomes, alternatives and he wished to proceed.  ?  ?OPERATIVE DETAIL: The patient was taken to the operating room and placed on the OR table in the supine position. A formal time out was performed with two patient identifiers and confirmed the operative site. Anesthesia was induced by the anesthesia team. The [place] head holder was applied to the head and a registration array was attached to the [place]. This was co-registered with the patient's preoperative imaging, the fit appeared to be acceptable. Using frameless stereotaxy, the operative trajectory was planned and the incision was marked. Hair was clipped with surgical clippers over the incision and the area was then prepped and draped in a sterile fashion. ? ?A curvilinear incision was placed in the left frontal region centered over the cyst. A burr hole was placed, dura coagulated and opened, and a shunt catheter was stereotactically guided into the cyst with return of yellow-tinged fluid. This was secured, cut, and attached to an [place]  reservoir with a silk tie to secure the connection. The aspirated fluid was sent for cytology. The ommaya was tapped with a needle to make sure it was functioning and the remainder of the fluid was removed with good flow until it was well-drained, totaling the expected volume of roughly 30cc. The wound was copiously irrigated, and hemostasis was confirmed. ? ?All instrument and sponge counts were correct, the incision was then closed in layers. The patient was then returned to anesthesia for emergence. No apparent complications at the completion of the procedure. ?  ?EBL:  29mL ?  ?DRAINS: none ?  ?SPECIMENS: Intracranial cyst fluid sent to pathology ?  ?Judith Part, MD ?01/08/22 ?8:43 AM ? ?

## 2022-01-08 NOTE — Transfer of Care (Signed)
Immediate Anesthesia Transfer of Care Note ? ?Patient: Spencer Butler ? ?Procedure(s) Performed: Left Ommaya placement with brainlab placement ventricular catheter (Left) ?APPLICATION OF CRANIAL NAVIGATION ? ?Patient Location: PACU ? ?Anesthesia Type:General ? ?Level of Consciousness: awake and drowsy ? ?Airway & Oxygen Therapy: Patient Spontanous Breathing ? ?Post-op Assessment: Report given to RN and Post -op Vital signs reviewed and stable ? ?Post vital signs: Reviewed and stable ? ?Last Vitals:  ?Vitals Value Taken Time  ?BP 142/111 01/08/22 1018  ?Temp 36.6 ?C 01/08/22 1018  ?Pulse 78 01/08/22 1019  ?Resp 12 01/08/22 1019  ?SpO2 97 % 01/08/22 1019  ?Vitals shown include unvalidated device data. ? ?Last Pain:  ?Vitals:  ? 01/08/22 0648  ?TempSrc:   ?PainSc: 0-No pain  ?   ? ?  ? ?Complications: No notable events documented. ?

## 2022-01-08 NOTE — Progress Notes (Signed)
?  Transition of Care (TOC) Screening Note ? ? ?Patient Details  ?Name: Spencer Butler ?Date of Birth: 1955/10/19 ? ? ?Transition of Care (TOC) CM/SW Contact:    ?Benard Halsted, LCSW ?Phone Number: ?01/08/2022, 4:17 PM ? ? ? ?Transition of Care Department [facility]) has reviewed patient and no TOC needs have been identified at this time. We will continue to monitor patient advancement through interdisciplinary progression rounds. If new patient transition needs arise, please place a TOC consult. ? ? ?

## 2022-01-08 NOTE — H&P (Signed)
Surgical H&P Update ? ?HPI: 66 y.o. with a history of SCLC with related persistent left sided intracranial cyst causing significant right sided weakness. No changes in health since they were last seen. Still having progressive / significant right sided weakness and wishes to proceed with surgery. ? ?PMHx:  ?Past Medical History:  ?Diagnosis Date  ? 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 ?FamHx: History reviewed. No pertinent family history. ?SocHx:  reports that he has been smoking cigarettes. He has been smoking an average of .5 packs per day. He has never used smokeless tobacco. He reports that he does not drink alcohol and does not use drugs. ? ?Physical Exam: ?Aox3, gaze conjugate, FS, RUE 4/5, RLE 4-/5, L side 5/5, diffuse RUE > RLE numbness and inc'd tone on the R worse in the leg than the arm ? ?Assesment/Plan: ?66 y.o. man with left sided cystic mass, here for drainage and ommaya placement. Risks, benefits, and alternatives discussed and the patient would like to continue with surgery. ? ?-OR today ?-4N ICU post-op ? ?Judith Part, MD ?01/08/22 ?8:41 AM ? ?

## 2022-01-08 NOTE — Anesthesia Postprocedure Evaluation (Signed)
Anesthesia Post Note ? ?Patient: Spencer Butler ? ?Procedure(s) Performed: Left Ommaya placement with brainlab placement ventricular catheter (Left) ?APPLICATION OF CRANIAL NAVIGATION ? ?  ? ?Patient location during evaluation: PACU ?Anesthesia Type: General ?Level of consciousness: awake ?Pain management: pain level controlled ?Vital Signs Assessment: post-procedure vital signs reviewed and stable ?Respiratory status: spontaneous breathing ?Cardiovascular status: stable ?Postop Assessment: no apparent nausea or vomiting ?Anesthetic complications: no ? ? ?No notable events documented. ? ?Last Vitals:  ?Vitals:  ? 01/08/22 0719 01/08/22 1018  ?BP: (!) 134/98 (!) 142/111  ?Pulse:  82  ?Resp:  14  ?Temp:  36.6 ?C  ?SpO2:  97%  ?  ?Last Pain:  ?Vitals:  ? 01/08/22 1030  ?TempSrc:   ?PainSc: Asleep  ? ? ?  ?  ?  ?  ?  ?  ? ?Netty Sullivant ? ? ? ? ?

## 2022-01-08 NOTE — Anesthesia Procedure Notes (Signed)
Arterial Line Insertion ?Start/End4/08/2022 8:00 AM, 01/08/2022 8:10 AM ?Performed by: Dorann Lodge, CRNA, CRNA ? Patient location: Pre-op. ?Preanesthetic checklist: patient identified, IV checked, site marked, risks and benefits discussed, surgical consent, monitors and equipment checked, pre-op evaluation, timeout performed and anesthesia consent ?Lidocaine 1% used for infiltration ?Right, radial was placed ?Catheter size: 20 G ?Hand hygiene performed  and maximum sterile barriers used  ? ?Attempts: 1 ?Procedure performed without using ultrasound guided technique. ?Following insertion, dressing applied and Biopatch. ?Post procedure assessment: normal and unchanged ? ?Patient tolerated the procedure well with no immediate complications. ? ? ?

## 2022-01-09 ENCOUNTER — Encounter (HOSPITAL_COMMUNITY): Payer: Self-pay | Admitting: Neurological Surgery

## 2022-01-09 LAB — CYTOLOGY - NON PAP

## 2022-01-09 MED ORDER — HYDROCODONE-ACETAMINOPHEN 5-325 MG PO TABS: 1.0000 | ORAL_TABLET | ORAL | 0 refills | Status: DC | PRN

## 2022-01-09 MED ORDER — CHLORHEXIDINE GLUCONATE CLOTH 2 % EX PADS: 6.0000 | MEDICATED_PAD | Freq: Every day | CUTANEOUS | Status: DC

## 2022-01-09 NOTE — Progress Notes (Signed)
Neurosurgery Service ?Progress Note ? ?Subjective: No acute events overnight, right sided strength / clumsiness already significantly improved, he's very happy  ? ?Objective: ?Vitals:  ? 01/09/22 0500 01/09/22 0600 01/09/22 0700 01/09/22 0800  ?BP: 114/84 121/82 111/85 116/80  ?Pulse: 68 70 65 91  ?Resp: 13 15 15 15   ?Temp:      ?TempSrc:      ?SpO2: 97% 97% 97% 97%  ?Weight:      ?Height:      ? ? ?Physical Exam: ?Aox3, PERRL, EOMI, FS & SS, Strength 5/5 on L, RUE 4/5, RLE 4- to 4/5, diffuse R sided numbness, inc'd tone on the R, incision c/d/i ? ?Assessment & Plan: ?66 y.o. man w/ SCLC and weakness 2/2 cystic mass s/p drainage / ommaya placement, recovering well. ? ?-post-op MRI with good placement, significant decrease in cyst size, unfortunately shows a few new small foci concerning for metastatic disease on the right side, will discuss at tumor board, pt aware of the new metastases ?-discharge home today ? ?Spencer Butler A Spencer Butler  ?01/09/22 ?8:13 AM ? ?

## 2022-01-09 NOTE — Discharge Summary (Signed)
Discharge Summary ? ?Date of Admission: 01/08/2022 ? ?Date of Discharge: 01/09/22 ? ? ?Attending Physician: Emelda Brothers, MD ? ?Hospital Course: Patient was admitted following an uncomplicated left craniotomy for drainage of a cystic metastatic focus and placement of an Ommaya reservoir into the cyst. They were recovered in PACU and transferred to 4N ICU. Post-op he did well, his hospital course was uncomplicated. A post-op MRI showed good placement and significant decrease in the size of the cyst, but unfortunately showed 2 possibly 3 new metastatic foci that were subcentimeter, which was discussed with him. The patient was discharged home on 01/09/22. They will follow up in clinic with me in clinic in 2 weeks and we will discuss the new metastatic disease findings at tumor board. ? ?Neurologic exam at discharge:  ?Aox3, PERRL, EOMI, FS & SS, Strength 5/5 on L, RUE 4/5, RLE 4-/5, diffuse R sided numbness, inc'd tone on the R, incision c/d/i ? ?Discharge diagnosis: Brain tumor ? ?Judith Part, MD ?01/09/22 ?8:15 AM ? ?

## 2022-01-09 NOTE — Progress Notes (Signed)
Patient's IV and Arterial line removed. Pt dressed and belongings with wife. Pt ate his lunch and will be taken downstairs via wheelchair after discharge paperwork reviewed. Indiya Izquierdo, Rande Brunt, RN   ?

## 2022-01-10 ENCOUNTER — Other Ambulatory Visit: Payer: Medicaid Other

## 2022-01-12 NOTE — Progress Notes (Signed)
?[facility]  ?[address] ?[place],  [place]  [address] ?[phone] ? ?Clinic Day:  01/13/2022 ? ?Referring physician: Nicoletta Dress, MD ? ?HISTORY OF PRESENT ILLNESS:  ?The patient is a 66 y.o. male with limited stage small cell lung cancer.  He completed definitive chemoradiation, which consisted of 4 cycles of cisplatin/etoposide in October 2021.  Unfortunately, a brain MRI showed a large, isolated brain lesion in his left frontoparietal lobe, which has led to mild right-sided neurological deficits.  Since his last visit, neurosurgery ultimately elected to have this gentleman's undergo surgery to remove/decrease the burden of the lesion in his left frontoparietal lobe.  This surgery ultimately yielded fluid that was sent to pathology.  Fortunately, this cystic lesion did not have any evidence of malignant cells.  In addition to this, brain MRI done after his surgery showed that this cystic lesion had significantly decreased in size from surgical intervention.  Since the surgery has occurred, this gentleman has gained a considerable amount of his right-sided body's function back.  Understandably, he is much more optimistic as it pertains to his small cell lung cancer history.  However, although his brain MRI did show a significant reduction in the cystic lesion, it was starting to show very small lesions elsewhere that suggested the early stages of small cell lung cancer potentially seeding to his brain. ? ?PHYSICAL EXAM:  ?Blood pressure (!) 164/102, pulse (!) 101, temperature 98.8 ?F (37.1 ?C), resp. rate 16, height 5\' 7"  (1.702 m), weight 140 lb 12.8 oz (63.9 kg), SpO2 98 %. ?Wt Readings from Last 3 Encounters:  ?01/13/22 140 lb 12.8 oz (63.9 kg)  ?01/08/22 140 lb (63.5 kg)  ?01/02/22 140 lb (63.5 kg)  ? ?Body mass index is 22.05 kg/m?Marland Kitchen ?Performance status (ECOG):  0 ?Physical Exam ?Constitutional:   ?   General: He is not in acute distress. ?   Appearance: He is not  ill-appearing.  ?   Comments: He looks stronger and has gained weight vs previous visits.  He is in a wheelchair. ?  ?HENT:  ?   Head: Normocephalic.  ?   Nose: Nose normal.  ?   Mouth/Throat:  ?   Mouth: Mucous membranes are moist.  ?   Pharynx: Oropharynx is clear.  ?Cardiovascular:  ?   Rate and Rhythm: Regular rhythm. Tachycardia present.  ?   Heart sounds: No murmur heard. ?  No friction rub. No gallop.  ?Pulmonary:  ?   Effort: Pulmonary effort is normal.  ?   Breath sounds: Normal breath sounds.  ?Abdominal:  ?   General: Bowel sounds are normal. There is no distension.  ?   Palpations: Abdomen is soft.  ?   Tenderness: There is no abdominal tenderness.  ?Musculoskeletal:  ?   Cervical back: No rigidity.  ?   Right lower leg: No edema.  ?   Left lower leg: No edema.  ?Skin: ?   General: Skin is warm and dry.  ?   Findings: No bruising, erythema or rash.  ?Neurological:  ?   General: No focal deficit present.  ?   Mental Status: He is oriented to person, place, and time.  ? ?ASSESSMENT & PLAN:  ?Assessment/Plan:  A 66 y.o. male with a history of limited stage small cell lung cancer.  In clinic today, I went over his brain MRI images with him, for which he could see that his cystic lesion has significantly decreased in size.  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  Clearly, this patient is doing much better.  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  This will need to be followed over time.  I will see him back in 2 months for repeat clinical assessment.  Scans will be done at that time to ascertain his new disease baseline.  The patient and his wife understand all the plans discussed today and are in agreement with them.   ? ?Ciena Sampley Macarthur Critchley, MD ? ? ? ?  ?

## 2022-01-13 ENCOUNTER — Inpatient Hospital Stay: Payer: Medicaid Other | Attending: Oncology | Admitting: Oncology

## 2022-01-13 ENCOUNTER — Telehealth: Payer: Self-pay | Admitting: Oncology

## 2022-01-13 ENCOUNTER — Other Ambulatory Visit: Payer: Self-pay | Admitting: Radiation Therapy

## 2022-01-13 ENCOUNTER — Inpatient Hospital Stay: Payer: Medicaid Other

## 2022-01-13 ENCOUNTER — Other Ambulatory Visit: Payer: Self-pay | Admitting: Oncology

## 2022-01-13 VITALS — BP 164/102 | HR 101 | Temp 98.8°F | Resp 16 | Ht 67.0 in | Wt 140.8 lb

## 2022-01-13 DIAGNOSIS — C3490 Malignant neoplasm of unspecified part of unspecified bronchus or lung: Secondary | ICD-10-CM

## 2022-01-13 DIAGNOSIS — C7931 Secondary malignant neoplasm of brain: Secondary | ICD-10-CM

## 2022-01-13 NOTE — Telephone Encounter (Signed)
Per 01/13/22 los next appt scheduled and confirmed by patient ?

## 2022-01-20 ENCOUNTER — Ambulatory Visit
Admission: RE | Admit: 2022-01-20 | Discharge: 2022-01-20 | Disposition: A | Discharge status: Home / Self Care | Payer: Medicaid Other | Source: Ambulatory Visit | Attending: Radiation Oncology | Admitting: Radiation Oncology

## 2022-01-20 ENCOUNTER — Other Ambulatory Visit: Payer: Medicaid Other

## 2022-01-20 ENCOUNTER — Other Ambulatory Visit: Payer: Self-pay | Admitting: Radiation Therapy

## 2022-01-20 DIAGNOSIS — C7931 Secondary malignant neoplasm of brain: Secondary | ICD-10-CM

## 2022-01-20 IMAGING — MR MR HEAD WO/W CM
12 series · 48 of 48 positions shown · IV contrast (multihance)
Comparison: MRI head [DATE]

CLINICAL DATA: Brain metastases, assess treatment response

EXAM:
MRI HEAD WITHOUT AND WITH CONTRAST
TECHNIQUE: Multiplanar, multiecho pulse sequences of the brain and surrounding
structures were obtained without and with intravenous contrast.
CONTRAST:  14mL MULTIHANCE GADOBENATE DIMEGLUMINE 529 MG/ML IV SOLN

[Series 2: FLAIR · sagittal · 3.0mm · 0.75mm/px · 2 of 38 slices shown (1 of 2)]
[im 1/38]
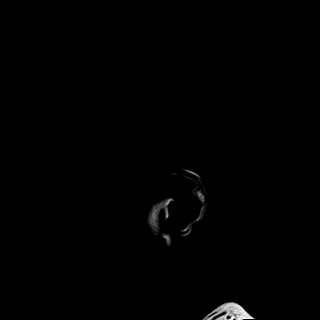
[im 38/38]
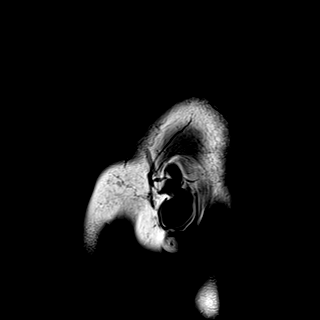

[Series 3: DWI · axial · 3.0mm · 1.50mm/px · z∈[-72,+82]mm · 4 of 82 slices shown (1 of 2)]
[im 1/82]
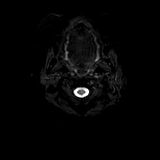
[im 28/82]
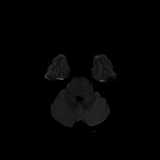
[im 55/82]
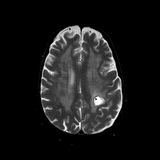
[im 82/82]
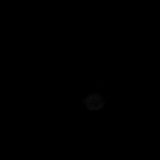

[Series 4: DWI · axial · 3.0mm · 1.50mm/px · z∈[-72,+82]mm · 2 of 41 slices shown (2 of 2)]
[im 1/41]
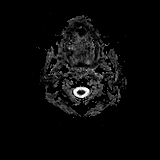
[im 41/41]
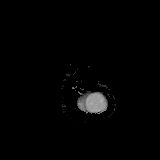

[Series 5: T2 · axial · 5.0mm · 0.57mm/px · 1 of 33 slices shown (1 of 2)]
[im 1/33]
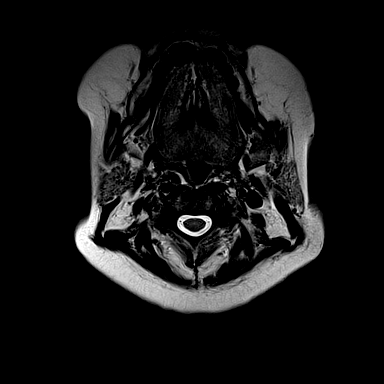

[Series 7: swi_images · axial · 1.5mm · 0.90mm/px · z∈[-92,+97]mm · 6 of 128 slices shown]
[im 1/128]
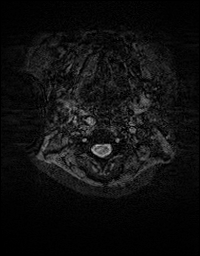
[im 26/128]
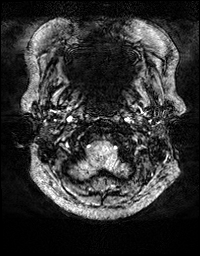
[im 51/128]
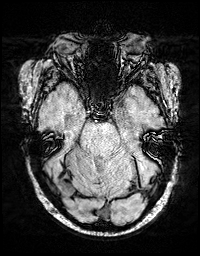
[im 77/128]
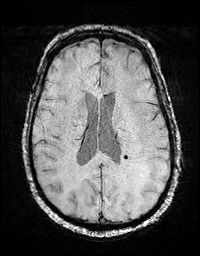
[im 102/128]
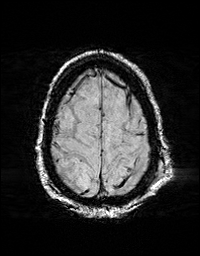
[im 128/128]
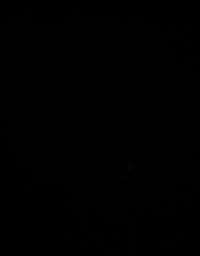

[Series 8: FLAIR · axial · 3.0mm · 0.86mm/px · z∈[-95,+111]mm · 3 of 70 slices shown (2 of 2)]
[im 1/70]
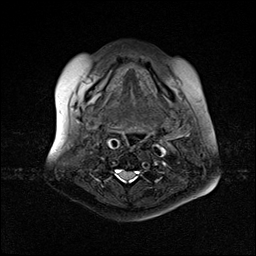
[im 35/70]
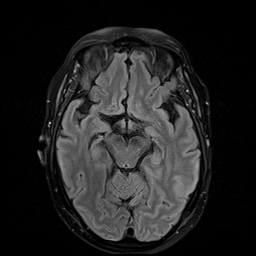
[im 70/70]
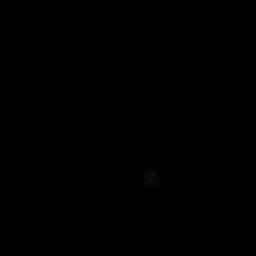

[Series 9: T2 · axial · non-contrast · 1.0mm · 0.86mm/px · z∈[-78,+108]mm · 8 of 192 slices shown (2 of 2)]
[im 1/192]
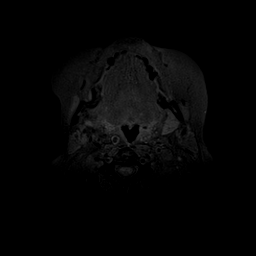
[im 28/192]
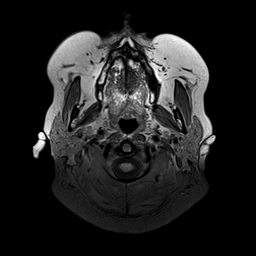
[im 55/192]
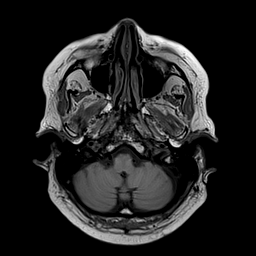
[im 82/192]
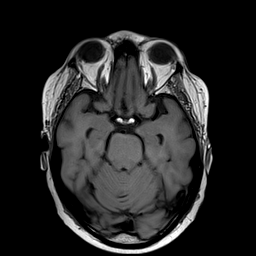
[im 110/192]
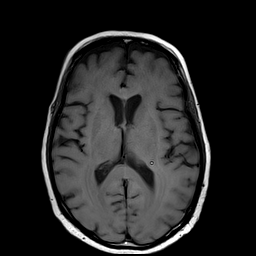
[im 137/192]
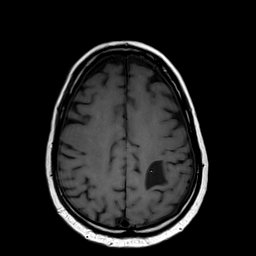
[im 164/192]
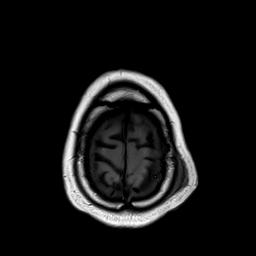
[im 192/192]
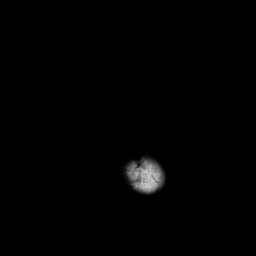

[Series 10: T2 post-contrast · coronal · 3.0mm · 0.57mm/px · 2 of 49 slices shown (1 of 2)]
[im 1/49]
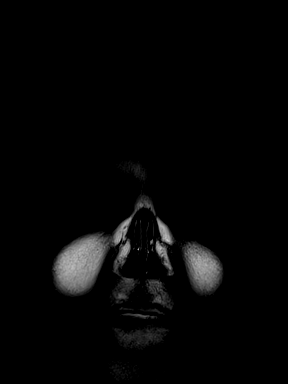
[im 49/49]
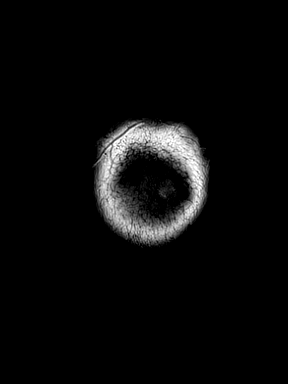

[Series 11: T2 post-contrast · axial · 1.0mm · 0.86mm/px · z∈[-78,+108]mm · 8 of 192 slices shown (2 of 2)]
[im 1/192]
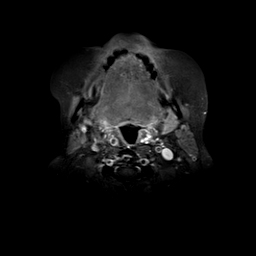
[im 28/192]
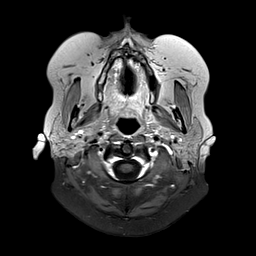
[im 55/192]
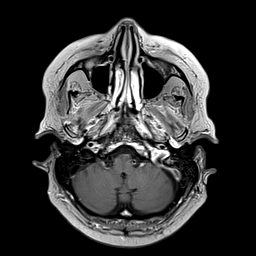
[im 82/192]
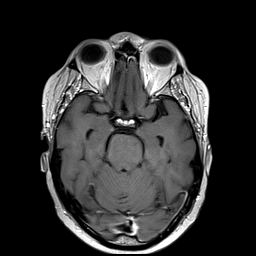
[im 110/192]
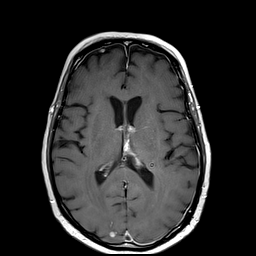
[im 137/192]
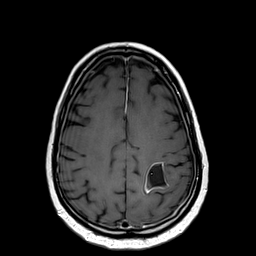
[im 164/192]
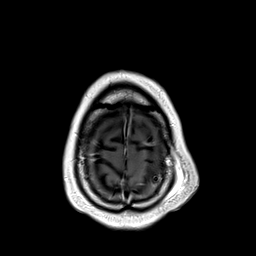
[im 192/192]
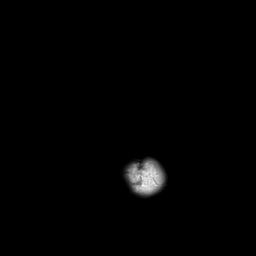

[Series 12: T1 post-contrast · axial · 1.0mm · 0.75mm/px · z∈[-81,+109]mm · 8 of 192 slices shown (1 of 2)]
[im 1/192]
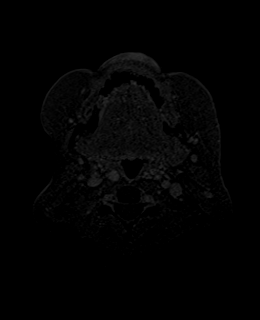
[im 28/192]
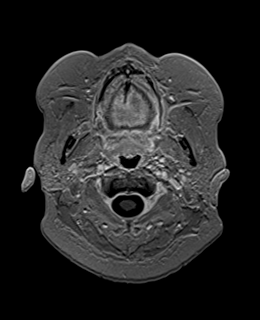
[im 55/192]
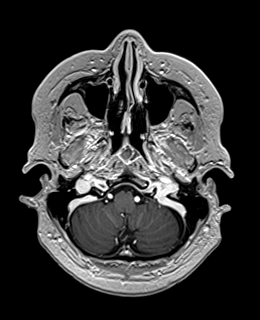
[im 82/192]
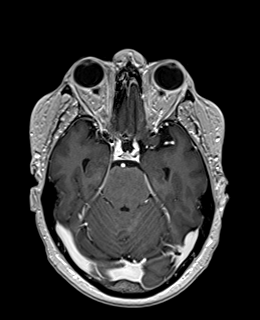
[im 110/192]
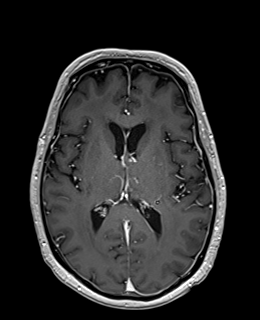
[im 137/192]
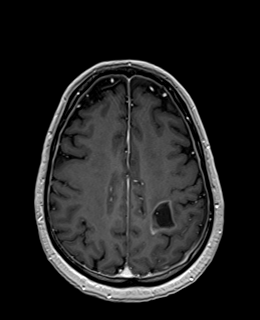
[im 164/192]
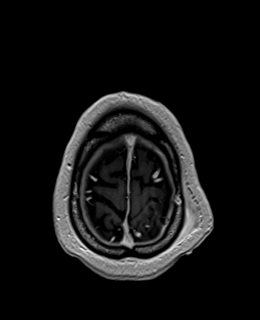
[im 192/192]
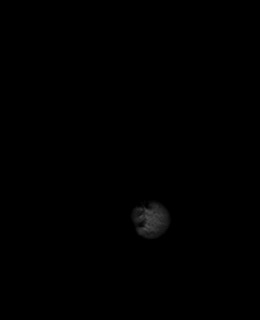

[Series 13: T1 post-contrast · coronal · 3.0mm · 0.57mm/px · 2 of 49 slices shown (2 of 2)]
[im 1/49]
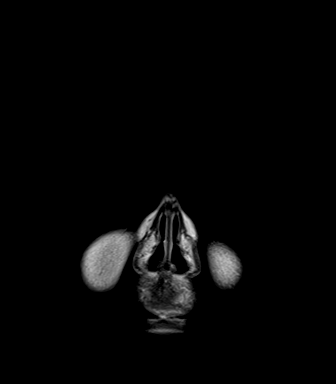
[im 49/49]
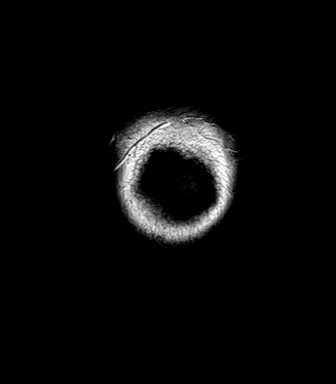

[Series 14: FLAIR post-contrast · sagittal · 3.0mm · 0.75mm/px · 2 of 39 slices shown]
[im 1/39]
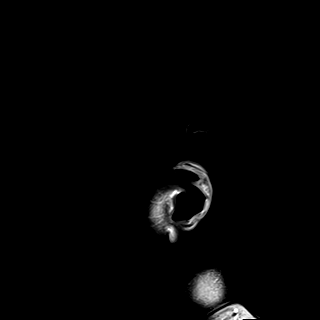
[im 39/39]
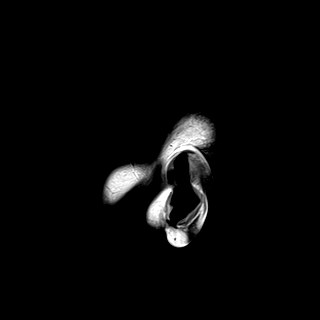

[48 of 48 positions shown; findings below may reference images not displayed]

FINDINGS: Brain: Similar versus minimally increased size of two enhancing
lesions in the right occipital lobe, measuring 7 mm on series 12,
image 116 and 4 mm on series 12, image 113. Faint enhancing lesion
previously seen at the right pons/middle cerebellar peduncle is no
longer visualized. New punctate enhancing metastasis in the right
frontal white matter (series 12, image 134). Similar size of the
left parietal cystic mass with new faint largely curvilinear
enhancement along the periphery of the mass. Air punctate areas of
intrinsic T1 hyperintensity along the superior aspect of the lesion,
probably areas of prior hemorrhage. Similar position of a Ommaya
reservoir or with catheter seen coursing inferiorly towards the left
thalamocapsular region. No evidence of acute infarct, acute
hemorrhage, significant midline shift, or hydrocephalus.
Redemonstrated T2/FLAIR hyperintensity within the white matter,
probably the sequela of prior whole brain radiation.

Vascular: Major arterial flow voids are maintained at the skull
base.

Skull and upper cervical spine: Left parietal craniotomy. Otherwise,
Ommaya reservoir in place. Otherwise, no focal marrow signal
abnormality.

Sinuses/Orbits: Clear sinuses.  No acute orbital findings.

Other: No mastoid effusions.
IMPRESSION: 1. Similar versus minimally increased size of two metastases in the
right occipital lobe, as above.
2. New punctate enhancing metastasis in the right frontal white
matter (series 12, image 134).
3. Faint possible enhancing lesion previously seen at the right
pons/middle cerebellar peduncle is no longer visualized.
4. Similar size of the left parietal cystic mass with new faint
curvilinear enhancement along the periphery of the mass. Findings
could be postoperative/post treatment related, but warrant attention
on follow-up. Similar position of a Ommaya reservoir.

## 2022-01-20 MED ORDER — GADOBENATE DIMEGLUMINE 529 MG/ML IV SOLN
14.0000 mL | Freq: Once | INTRAVENOUS | Status: AC | PRN
  Administered 2022-01-20: 14 mL via INTRAVENOUS

## 2022-01-20 MED ORDER — SODIUM CHLORIDE 0.9% FLUSH
10.0000 mL | INTRAVENOUS | Status: DC | PRN
  Administered 2022-01-20: 10 mL via INTRAVENOUS

## 2022-01-20 MED ORDER — HEPARIN SOD (PORK) LOCK FLUSH 100 UNIT/ML IV SOLN
500.0000 [IU] | Freq: Once | INTRAVENOUS | Status: AC
  Administered 2022-01-20: 500 [IU] via INTRAVENOUS

## 2022-01-21 ENCOUNTER — Ambulatory Visit
Admission: RE | Admit: 2022-01-21 | Discharge: 2022-01-21 | Disposition: A | Discharge status: Home / Self Care | Payer: Medicaid Other | Source: Ambulatory Visit | Attending: Radiation Oncology | Admitting: Radiation Oncology

## 2022-01-21 ENCOUNTER — Other Ambulatory Visit: Payer: Self-pay | Admitting: Oncology

## 2022-01-21 ENCOUNTER — Encounter: Payer: Self-pay | Admitting: Oncology

## 2022-01-21 VITALS — BP 128/97 | HR 110 | Temp 98.1°F | Resp 20

## 2022-01-21 DIAGNOSIS — C7931 Secondary malignant neoplasm of brain: Secondary | ICD-10-CM | POA: Diagnosis present

## 2022-01-21 DIAGNOSIS — Z510 Encounter for antineoplastic radiation therapy: Secondary | ICD-10-CM | POA: Diagnosis present

## 2022-01-21 DIAGNOSIS — C3490 Malignant neoplasm of unspecified part of unspecified bronchus or lung: Secondary | ICD-10-CM

## 2022-01-21 DIAGNOSIS — G9389 Other specified disorders of brain: Secondary | ICD-10-CM

## 2022-01-21 DIAGNOSIS — C383 Malignant neoplasm of mediastinum, part unspecified: Secondary | ICD-10-CM | POA: Diagnosis present

## 2022-01-21 MED ORDER — SODIUM CHLORIDE 0.9% FLUSH
10.0000 mL | Freq: Once | INTRAVENOUS | Status: AC
  Administered 2022-01-21: 10 mL via INTRAVENOUS

## 2022-01-21 MED ORDER — HEPARIN SOD (PORK) LOCK FLUSH 100 UNIT/ML IV SOLN
500.0000 [IU] | Freq: Once | INTRAVENOUS | Status: AC
  Administered 2022-01-21: 500 [IU] via INTRAVENOUS

## 2022-01-21 NOTE — Progress Notes (Signed)
?  Radiation Oncology         [phone] ?________________________________ ? ?Name: Spencer Butler MRN: [id number]  ?Date: 01/21/2022  DOB: 06-Jul-1956 ? ?SIMULATION AND TREATMENT PLANNING NOTE ? ?  ICD-10-CM   ?1. 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  ? ? ?DIAGNOSIS:  65 yo man with multiple brain metastases s/p WBRT secondary to known small cell cancer of the lung. ? ?NARRATIVE:  The patient was brought to the [facility].  Identity was confirmed.  All relevant records and images related to the planned course of therapy were reviewed.  The patient freely provided informed written consent to proceed with treatment after reviewing the details related to the planned course of therapy. The consent form was witnessed and verified by the simulation staff. Intravenous access was established for contrast administration. Then, the patient was set-up in a stable reproducible supine position for radiation therapy.  A relocatable thermoplastic stereotactic head frame was fabricated for precise immobilization.  CT images were obtained.  Surface markings were placed.  The CT images were loaded into the planning software and fused with the patient's targeting MRI scan.  Then the target and avoidance structures were contoured.  Treatment planning then occurred.  The radiation prescription was entered and confirmed.  I have requested 3D planning  I have requested a DVH of the following structures: Brain stem, brain, left eye, right eye, lenses, optic chiasm, target volumes, uninvolved brain, and normal tissue.   ? ?SPECIAL TREATMENT PROCEDURE:  The planned course of therapy using radiation constitutes a special treatment procedure. Special care is required in the management of this patient for the following reasons. This treatment constitutes a Special Treatment Procedure for the following reason: High dose per fraction requiring special monitoring for increased toxicities of treatment including daily imaging.  The  special nature of the planned course of radiotherapy will require increased physician supervision and oversight to ensure patient's safety with optimal treatment outcomes.  This requires extended time and effort. ? ?PLAN:  The patient will receive 15 Gy in 1 fraction to the largest cystic lesion and 20 Gy in 1 fraction to three smaller brain mets.. ? ?________________________________ ? ?Sheral Apley Tammi Klippel, M.D. ? ?

## 2022-01-21 NOTE — Progress Notes (Signed)
Has armband been applied?  Yes ? ?Does patient have an allergy to IV contrast dye?: No ?  ?Has patient ever received premedication for IV contrast dye?: No  ? ?Does patient take metformin?: No ? ?If patient does take metformin when was the last dose: NA ? ?Date of lab work: 11/27/2021 ?BUN: 16 ?CR: 0.90 ? ?IV site: Port right chest ? ?Has IV site been added to flowsheet?  yes ? ?Vitals: 125/97-BP, 99-pulse, 20-respirations, 100%- O2, 97.8 -temp ? ?

## 2022-01-21 NOTE — Addendum Note (Signed)
Encounter addended by: Sharol Given, RN on: 01/21/2022 3:55 PM ? Actions taken: Visit diagnoses modified, Order list changed, Diagnosis association updated, MAR administration accepted, Flowsheet accepted

## 2022-01-23 DIAGNOSIS — Z510 Encounter for antineoplastic radiation therapy: Secondary | ICD-10-CM | POA: Diagnosis not present

## 2022-01-24 ENCOUNTER — Other Ambulatory Visit: Payer: Self-pay

## 2022-01-24 ENCOUNTER — Ambulatory Visit
Admission: RE | Admit: 2022-01-24 | Discharge: 2022-01-24 | Disposition: A | Discharge status: Home / Self Care | Payer: Medicaid Other | Source: Ambulatory Visit | Attending: Radiation Oncology | Admitting: Radiation Oncology

## 2022-01-24 ENCOUNTER — Encounter: Payer: Self-pay | Admitting: Urology

## 2022-01-24 DIAGNOSIS — C7931 Secondary malignant neoplasm of brain: Secondary | ICD-10-CM

## 2022-01-24 DIAGNOSIS — Z510 Encounter for antineoplastic radiation therapy: Secondary | ICD-10-CM | POA: Diagnosis not present

## 2022-01-24 LAB — RAD ONC ARIA SESSION SUMMARY
Course Elapsed Days: 0
Plan Fractions Treated to Date: 1
Plan Prescribed Dose Per Fraction: 15 Gy
Plan Total Fractions Prescribed: 1
Plan Total Prescribed Dose: 15 Gy
Reference Point Dosage Given to Date: 15 Gy
Reference Point Session Dosage Given: 15 Gy
Session Number: 1

## 2022-01-24 NOTE — Progress Notes (Signed)
Patient [place] [facility] treatment over to nursing for observation for 30 minutes.  Denies headache, blurred vision, and nausea.  Left clinic via wheelchair and spouse.  Vitals:  97.0 temp, 101 pulse, 18 respirations, 125/101 BP, and O2 100%. ?

## 2022-01-24 NOTE — Op Note (Signed)
?  Name: Spencer Butler  MRN: [id number]  ?Date: 01/24/2022   DOB: 1955-11-25 ? ?Stereotactic Radiosurgery Operative Note ? ?PRE-OPERATIVE DIAGNOSIS:  Metastatic lung cancer with intracranial metastases ? ?POST-OPERATIVE DIAGNOSIS:  Same ? ?PROCEDURE:  Stereotactic Radiosurgery ? ?SURGEON:  Judith Part, MD ? ?NARRATIVE: The patient underwent a radiation treatment planning session in the radiation oncology simulation suite under the care of the radiation oncology physician and physicist.  I participated closely in the radiation treatment planning afterwards. The patient underwent planning CT which was fused to 3T high resolution MRI with 1 mm axial slices.  These images were fused on the planning system.  We contoured the gross target volumes and subsequently expanded this to yield the Planning Target Volume. I actively participated in the planning process.  I helped to define and review the target contours and also the contours of the optic pathway, eyes, brainstem and selected nearby organs at risk.  All the dose constraints for critical structures were reviewed and compared to AAPM Task Group 101.  The prescription dose conformity was reviewed.  I approved the plan electronically.   ? ?Accordingly, Rosann Auerbach was brought to the TrueBeam stereotactic radiation treatment linac and placed in the custom immobilization mask.  The patient was aligned according to the IR fiducial markers with BrainLab Exactrac, then orthogonal x-rays were used in ExacTrac with the 6DOF robotic table and the shifts were made to align the patient ? ?Rosann Auerbach received stereotactic radiosurgery uneventfully.   ? ?Lesions treated:  4  ? ?Complex lesions treated:  0 (>3.5 cm, <59mm of optic path, or within the brainstem)  ? ?The detailed description of the procedure is recorded in the radiation oncology procedure note.   ? ?DISPOSITION:  Following delivery, the patient was transported to nursing in stable condition and monitored for  possible acute effects to be discharged to home in stable condition with follow-up in one month. ? ?Judith Part, MD ?01/24/2022 4:47 PM ? ?

## 2022-01-25 NOTE — Progress Notes (Signed)
?  Radiation Oncology         [phone] ?________________________________ ? ?Stereotactic Treatment Procedure Note ? ?Name: Spencer Butler MRN: [id number]  ?Date: 01/24/2022  DOB: 03-Nov-1955 ? ?SPECIAL TREATMENT PROCEDURE ? ?  ICD-10-CM   ?1. 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  ? ? ?3D TREATMENT PLANNING AND DOSIMETRY:  The patient's radiation plan was reviewed and approved by neurosurgery and radiation oncology prior to treatment.  It showed 3-dimensional radiation distributions overlaid onto the planning CT/MRI image set.  The [facility] for the target structures as well as the organs at risk were reviewed. The documentation of the 3D plan and dosimetry are filed in the radiation oncology EMR. ? ?NARRATIVE:  Spencer Butler was brought to the TrueBeam stereotactic radiation treatment machine and placed supine on the CT couch. The head frame was applied, and the patient was set up for stereotactic radiosurgery.  Neurosurgery was present for the set-up and delivery ? ?SIMULATION VERIFICATION:  In the couch zero-angle position, the patient underwent Exactrac imaging using the Brainlab system with orthogonal KV images.  These were carefully aligned and repeated to confirm treatment position for each of the isocenters.  The Exactrac snap film verification was repeated at each couch angle. ? ?PROCEDURE: Spencer Butler received stereotactic radiosurgery to the following targets: ?PTV1 Left Parietal 33 mm ?PTV2 Right Occipital 7 mm ?PTV3 Right Occipital 7 mm ?PTV4 Right Frontal 3 mm ?Each of these targets were treated with a single isocenter and treated using 6 Rapid Arc VMAT Beams to a prescription dose of 15 Gy to PTV1 and 20 Gy to the others.  ExacTrac registration was performed for each couch angle.  The 100% isodose line was prescribed.  6 MV X-rays were delivered in the flattening filter free beam mode. ? ?STEREOTACTIC TREATMENT MANAGEMENT:  Following delivery, the patient was transported to nursing in stable condition  and monitored for possible acute effects.  Vital signs were recorded . The patient tolerated treatment without significant acute effects, and was discharged to home in stable condition.   ? ?PLAN: Follow-up in one month. ? ?________________________________ ? ?Sheral Apley Tammi Klippel, M.D. ? ? ?

## 2022-01-27 ENCOUNTER — Telehealth: Payer: Self-pay | Admitting: Internal Medicine

## 2022-01-27 ENCOUNTER — Telehealth: Payer: Self-pay | Admitting: Radiation Therapy

## 2022-01-27 NOTE — Telephone Encounter (Signed)
Spoke with Mrs. Ferrara about the recommendation for Val to see Dr. Mickeal Skinner and begin taking Keppra. An appointment for Dr. Mickeal Skinner has been scheduled on 5/16. She was thankful for the call.  ? ?Mont Dutton R.T.(R)(T) ?Radiation Special Procedures Navigator  ?

## 2022-01-27 NOTE — Telephone Encounter (Signed)
Scheduled appt per 5/1 staff msg from [place]. Pt is aware of appt date and time. Pt is aware to arrive 15 mins prior to appt time and to bring and updated insurance card. Pt is aware of appt location.   ?

## 2022-01-27 NOTE — Telephone Encounter (Signed)
I spoke with Spencer Butler wife today. On Saturday, his Rt arm and lower leg were twitching and moving involuntarily. This has stopped, and he did not have any other new or unusual symptoms over the weekend. They would just like to know why this was happening and if anything needs to be done to prevent it from happening again?  ? ?He has completed the steroid taper, is not on Keppra, and is only taking Megace for his appetite.  ? ?Mont Dutton R.T.(R)(T) ?Radiation Special Procedures Navigator  ?

## 2022-01-28 ENCOUNTER — Other Ambulatory Visit: Payer: Self-pay | Admitting: Urology

## 2022-01-28 MED ORDER — LEVETIRACETAM 500 MG PO TABS: 500.0000 mg | ORAL_TABLET | Freq: Two times a day (BID) | ORAL | 0 refills | Status: DC

## 2022-02-11 ENCOUNTER — Other Ambulatory Visit: Payer: Self-pay

## 2022-02-11 ENCOUNTER — Inpatient Hospital Stay: Payer: Medicaid Other | Attending: Oncology | Admitting: Internal Medicine

## 2022-02-11 VITALS — BP 112/90 | HR 120 | Temp 97.9°F | Resp 18 | Ht 67.0 in | Wt 140.4 lb

## 2022-02-11 DIAGNOSIS — F17210 Nicotine dependence, cigarettes, uncomplicated: Secondary | ICD-10-CM | POA: Diagnosis not present

## 2022-02-11 DIAGNOSIS — Z79899 Other long term (current) drug therapy: Secondary | ICD-10-CM | POA: Diagnosis not present

## 2022-02-11 DIAGNOSIS — C3490 Malignant neoplasm of unspecified part of unspecified bronchus or lung: Secondary | ICD-10-CM | POA: Insufficient documentation

## 2022-02-11 DIAGNOSIS — Z9221 Personal history of antineoplastic chemotherapy: Secondary | ICD-10-CM | POA: Diagnosis not present

## 2022-02-11 DIAGNOSIS — R569 Unspecified convulsions: Secondary | ICD-10-CM | POA: Insufficient documentation

## 2022-02-11 DIAGNOSIS — C7931 Secondary malignant neoplasm of brain: Secondary | ICD-10-CM | POA: Diagnosis present

## 2022-02-11 MED ORDER — LAMOTRIGINE 25 MG PO TABS: 50.0000 mg | ORAL_TABLET | Freq: Every day | ORAL | 3 refills | Status: DC

## 2022-02-11 NOTE — Progress Notes (Signed)
? ?[facility] [address]  ?[place], [place] [address] ?[phone] ? ? ?New Patient Evaluation ? ?Date of Service: 02/11/22 ?Patient Name: Spencer Butler ?Patient MRN: [id number] ?Patient DOB: [birth year] ?Provider: Ventura Sellers, MD ? ?Identifying Statement:  ?Govind Furey is a 66 y.o. male with Metastasis to brain [facility]) who presents for initial consultation and evaluation regarding cancer associated neurologic deficits.   ? ?Referring Provider: ?Nicoletta Dress, MD ?[address] ?[address] ?[place],  [place] [address] ? ?Primary Cancer: ? 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 The patient had palonosetron (ALOXI) injection 0.25 mg, 0.25 mg, Intravenous,  Once, 4 of 4 cycles ?Administration: 0.25 mg (07/10/2020), 0.25 mg (08/07/2020), 0.25 mg (09/04/2020) ?pegfilgrastim-bmez (ZIEXTENZO) injection 6 mg, 6 mg, Subcutaneous,  Once, 2 of 2 cycles ?Administration: 6 mg (09/07/2020) ?CISplatin (PLATINOL) 120 mg in sodium chloride 0.9 % 500 mL chemo infusion, 74.5 mg/m2 = 120 mg (original dose ), Intravenous,  Once, 4 of 4 cycles ?Dose modification: 74.5 mg/m2 (Cycle 1, Reason: Other (see comments), Comment: Transcribed from mosaiq), 75 mg/m2 (original dose 75 mg/m2, Cycle 2, Reason: Other (see comments), Comment: Transcribed from mosaiq) ?Administration: 121 mg (07/10/2020), 121 mg (08/07/2020), 121 mg (09/04/2020) ?etoposide (VEPESID) 160 mg in sodium chloride 0.9 % 500 mL chemo infusion, 100 mg/m2, Intravenous,  Once, 4 of 4 cycles ?Administration: 160 mg (07/10/2020), 160 mg (07/11/2020), 160 mg (07/12/2020), 160 mg (08/07/2020), 160 mg (08/08/2020), 160 mg (08/09/2020), 160 mg (09/04/2020), 160 mg (09/05/2020), 160 mg (09/06/2020) ?fosaprepitant (EMEND) 150 mg in sodium chloride 0.9 % 145 mL IVPB, 150 mg, Intravenous,  Once, 4 of 4 cycles ?Administration: 150 mg (07/10/2020), 150 mg  (08/07/2020), 150 mg (09/04/2020) ? ? for chemotherapy treatment.  ? ?  ?07/01/2020 Initial Diagnosis  ? 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 ?  ?Small cell lung cancer ([place])  ?06/12/2020 Cancer Staging  ? Staging form: Lung, AJCC 8th Edition ?- Clinical stage from 06/12/2020: Stage IIIA (cT4, cN0, cM0) - Signed by Marice Potter, MD on 10/03/2020 ? ?  ?10/03/2020 Initial Diagnosis  ? Small cell lung cancer ([place]) ? ?  ? ?CNS Oncologic History: ?10/08/21: P/w large cystic L parietal met, completes WBRT [facility]) ?01/08/22: Cystic progression, Ommaya drain placed by Dr. Zada Finders ?01/24/22: Salvage SRS x4 Tammi Klippel) ? ?History of Present Illness: ?The patient's records from the referring physician were obtained and reviewed and the patient interviewed to confirm this HPI.  Morrill Bomkamp presents today for follow up after recent [place] with Dr. Tammi Klippel.  He reports significant improvement in his right sided weakness following cyst aspiration, ommaya placement.  He does walk a little on his own, but uses a walker or wheelchair for longer outings.  He continues to work with PT.  Takes care of most needs independently at home.  Did have one seizure earlier this month, right leg shaking.  No recurrence since dosing the Keppra $RemoveBe'500mg'OPehxlhyv$  BID, but he complains of fatigue and irritability with the medication. ? ?Medications: ?Current Outpatient Medications on File Prior to Visit  ?Medication Sig Dispense Refill  ? acetaminophen (TYLENOL) 500 MG tablet Take 500 mg by mouth every 8 (eight) hours as needed for moderate pain or headache.    ? levETIRAcetam (KEPPRA) 500 MG tablet Take 1 tablet (500 mg total) by mouth 2 (two) times daily. 60 tablet 0  ? megestrol (MEGACE) 40 MG tablet Take 40  mg by mouth daily.    ? HYDROcodone-acetaminophen (NORCO/VICODIN) 5-325 MG tablet Take 1 tablet by mouth every 4 (four) hours as needed for moderate pain. (Patient not taking: Reported on 02/11/2022) 30 tablet 0  ? ?No current  facility-administered medications on file prior to visit.  ? ? ?Allergies: No Known Allergies ?Past Medical History:  ?Past Medical History:  ?Diagnosis Date  ? 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 ?Past Surgical History:  ?Past Surgical History:  ?Procedure Laterality Date  ? APPLICATION OF CRANIAL NAVIGATION N/A 01/08/2022  ? Procedure: APPLICATION OF CRANIAL NAVIGATION;  Surgeon: Judith Part, MD;  Location: [facility];  Service: Neurosurgery;  Laterality: N/A;  ? BURR HOLE Left 01/08/2022  ? Procedure: Left Ommaya placement with brainlab placement ventricular catheter;  Surgeon: Judith Part, MD;  Location: [facility];  Service: Neurosurgery;  Laterality: Left;  ? ?Social History:  ?Social History  ? ?Socioeconomic History  ? Marital status: Married  ?  Spouse name: Not on file  ? Number of children: Not on file  ? Years of education: Not on file  ? Highest education level: Not on file  ?Occupational History  ? Not on file  ?Tobacco Use  ? Smoking status: Every Day  ?  Packs/day: 0.50  ?  Types: Cigarettes  ? Smokeless tobacco: Never  ?Vaping Use  ? Vaping Use: Never used  ?Substance and Sexual Activity  ? Alcohol use: Never  ? Drug use: Never  ? Sexual activity: Not on file  ?Other Topics Concern  ? Not on file  ?Social History Narrative  ? Not on file  ? ?Social Determinants of Health  ? ?Financial Resource Strain: Not on file  ?Food Insecurity: Not on file  ?Transportation Needs: Not on file  ?Physical Activity: Not on file  ?Stress: Not on file  ?Social Connections: Not on file  ?Intimate Partner Violence: Not on file  ? ?Family History: No family history on file. ? ?Review of Systems: ?Constitutional: Doesn't report fevers, chills or abnormal weight loss ?Eyes: Doesn't report blurriness of vision ?Ears, nose, mouth, throat, and face: Doesn't report sore  throat ?Respiratory: Doesn't report cough, dyspnea or wheezes ?Cardiovascular: Doesn't report palpitation, chest discomfort  ?Gastrointestinal:  Doesn't report nausea, constipation, diarrhea ?GU: Doesn't report incontinence ?Skin: Doesn't report skin rashes ?Neurological: Per HPI ?Musculoskeletal: Doesn't report joint pain ?Behavioral/Psych: Doesn't report anxiety ? ?Physical Exam: ?Vitals:  ? 02/11/22 0957  ?BP: 112/90  ?Pulse: (!) 120  ?Resp: 18  ?Temp: 97.9 ?F (36.6 ?C)  ?SpO2: 100%  ? ?KPS: 70. ?General: Alert, cooperative, pleasant, in no acute distress ?Head: Normal ?EENT: No conjunctival injection or scleral icterus.  ?Lungs: Resp effort normal ?Cardiac: Regular rate ?Abdomen: Non-distended abdomen ?Skin: No rashes cyanosis or petechiae. ?Extremities: No clubbing or edema ? ?Neurologic Exam: ?Mental Status: Awake, alert, attentive to examiner. Oriented to self and environment. Language is fluent with intact comprehension. Psychomotor slowing. ?Cranial Nerves: Visual acuity is grossly normal. Visual fields are full. Extra-ocular movements intact. No ptosis. Face is symmetric ?Motor: Tone and bulk are normal. Power is 4+/5 in right arm and leg. Reflexes are symmetric, no pathologic reflexes present.  ?Sensory: Intact to light touch ?Gait: Hemiparetic ? ? ?Labs: ?I have reviewed the data as listed ?   ?Component Value Date/Time  ? NA 137 11/27/2021 0000  ? K 4.2 11/27/2021  0000  ? CL 111 (A) 11/27/2021 0000  ? CO2 15 11/27/2021 0000  ? GLUCOSE 84 08/06/2020 1129  ? BUN 16 11/27/2021 0000  ? CREATININE 0.71 01/08/2022 1226  ? CREATININE 0.90 08/06/2020 1129  ? CALCIUM 9.2 11/27/2021 0000  ? PROT 6.7 08/06/2020 1129  ? ALBUMIN 3.4 (A) 11/27/2021 0000  ? AST 40 11/27/2021 0000  ? AST 14 (L) 08/06/2020 1129  ? ALT 38 11/27/2021 0000  ? ALT 9 08/06/2020 1129  ? ALKPHOS 96 11/27/2021 0000  ? BILITOT 0.6 08/06/2020 1129  ? GFRNONAA >60 01/08/2022 1226  ? GFRNONAA >60 08/06/2020 1129  ? ?Lab Results  ?Component Value  Date  ? WBC 7.9 01/08/2022  ? NEUTROABS 6.10 11/27/2021  ? HGB 13.0 01/08/2022  ? HCT 39.9 01/08/2022  ? MCV 89.7 01/08/2022  ? PLT 135 (L) 01/08/2022  ? ? ?Imaging: ? ?MR Brain W Wo Contrast ? ?Result Date: 01/20/2022 ?C

## 2022-02-12 ENCOUNTER — Telehealth: Payer: Self-pay

## 2022-02-12 ENCOUNTER — Telehealth: Payer: Self-pay | Admitting: Internal Medicine

## 2022-02-12 NOTE — Telephone Encounter (Signed)
Called and left voicemail for pt regarding new prescription of Lamictal for seizures and that it is ready for pickup at their pharmacy on file, [facility] in [place] on [address]. Gave callback # [phone] if pt has any questions or concerns.  ?

## 2022-02-12 NOTE — Telephone Encounter (Signed)
Scheduled per 5/16 los, pt has been called and wife confirmed appt ?

## 2022-02-17 ENCOUNTER — Other Ambulatory Visit: Payer: Self-pay | Admitting: Radiation Therapy

## 2022-02-17 ENCOUNTER — Other Ambulatory Visit: Payer: Self-pay | Admitting: *Deleted

## 2022-02-17 DIAGNOSIS — Z95828 Presence of other vascular implants and grafts: Secondary | ICD-10-CM

## 2022-02-25 ENCOUNTER — Encounter: Payer: Self-pay | Admitting: Urology

## 2022-02-25 NOTE — Progress Notes (Signed)
  Radiation Oncology         [phone] ________________________________  Name: [name] MRN: [id number]  Date: 01/24/2022  DOB: Jun 28, 1956  End of Treatment Note  Diagnosis:   66 yo man with multiple brain metastases s/p WBRT secondary to known small cell cancer of the lung.     Indication for treatment:  Palliation       Radiation treatment dates:  01/24/22  Site/dose/beams/energy: SRS to the following targets: PTV1 Left Parietal 33 mm PTV2 Right Occipital 7 mm PTV3 Right Occipital 7 mm PTV4 Right Frontal 3 mm Each of these targets were treated with a single isocenter and treated using 6 Rapid Arc VMAT Beams to a prescription dose of 15 Gy to PTV1 and 20 Gy to the others.  ExacTrac registration was performed for each couch angle.  The 100% isodose line was prescribed.  6 MV X-rays were delivered in the flattening filter free beam mode.  Narrative: The patient tolerated radiation treatment relatively well.    Plan: The patient has completed radiation treatment. The patient will return to radiation oncology clinic for routine followup in one month. I advised them to call or return sooner if they have any questions or concerns related to their recovery or treatment. ________________________________  Sheral Apley. Tammi Klippel, M.D.

## 2022-02-25 NOTE — Progress Notes (Signed)
Telephone appointment. Patient identity verified. No issues reported at this time.  Meaningful use complete.  Reminded patient of his 11:30am-02/26/22 telephone appointment w/ Ashlyn Bruning PA-C. I left my extension [phone] in case patient needs anything. Patient verbalized understanding of information.  Patient preferred contact [phone]

## 2022-02-25 NOTE — Progress Notes (Signed)
Radiation Oncology         [phone] ________________________________  Name: Spencer Butler MRN: [id number]  Date: 02/26/2022  DOB: [birth year]  Post Treatment Note  CC: Nicoletta Dress, MD  Nicoletta Dress, MD  Diagnosis:   66 yo man with multiple brain metastases s/p WBRT secondary to known small cell cancer of the lung.  Interval Since Last Radiation:  4.5 weeks  01/24/22: SRS to the following targets: PTV1 Left Parietal 33 mm PTV2 Right Occipital 7 mm PTV3 Right Occipital 7 mm PTV4 Right Frontal 3 mm Each of these targets were treated with a single isocenter and treated using 6 Rapid Arc VMAT Beams to a prescription dose of 15 Gy to PTV1 and 20 Gy to the others.    09/16/21 - 10/03/21: Whole brain / 30 Gy in 12 fractions (Dr. Orlene Erm)   07/02/20 - 08/14/20: Left lung/mediastinum / 60 Gy in 30 fractions, concurrent with systemic chemotherapy (Dr. Orlene Erm)  Narrative:  I spoke with the patient to conduct his routine scheduled 1 month follow up visit via telephone to spare the patient unnecessary potential exposure in the healthcare setting during the current COVID-19 pandemic.  The patient was notified in advance and gave permission to proceed with this visit format.  He tolerated radiation treatment relatively well.                                On review of systems, the patient states ***  ALLERGIES:  has No Known Allergies.  Meds: Current Outpatient Medications  Medication Sig Dispense Refill   acetaminophen (TYLENOL) 500 MG tablet Take 500 mg by mouth every 8 (eight) hours as needed for moderate pain or headache.     HYDROcodone-acetaminophen (NORCO/VICODIN) 5-325 MG tablet Take 1 tablet by mouth every 4 (four) hours as needed for moderate pain. (Patient not taking: Reported on 02/11/2022) 30 tablet 0   lamoTRIgine (LAMICTAL) 25 MG tablet Take 2 tablets (50 mg total) by mouth daily. 60 tablet 3   megestrol (MEGACE) 40 MG tablet Take 40 mg by mouth daily.     No  current facility-administered medications for this encounter.    Physical Findings:  vitals were not taken for this visit.  Pain Assessment Pain Score: 0-No pain/10 Unable to assess due to telephone follow-up visit format.  Lab Findings: Lab Results  Component Value Date   WBC 7.9 01/08/2022   HGB 13.0 01/08/2022   HCT 39.9 01/08/2022   MCV 89.7 01/08/2022   PLT 135 (L) 01/08/2022     Radiographic Findings: No results found.  Impression/Plan: 109. 66 yo man with multiple brain metastases s/p WBRT secondary to known small cell cancer of the lung. He appears to have recovered well from the effects of his recent stereotactic radiosurgery and is currently without complaints.  He is scheduled for a posttreatment MRI brain scan on 04/25/2022 and a follow-up visit with Dr. Mickeal Skinner thereafter on 04/28/2022 to review the results and recommendations from the multidisciplinary brain conference.  He will also continue in routine follow-up under the care and direction of Dr. Lavera Guise for continued management of his systemic disease with his next scheduled follow-up visit on 03/14/2022, with repeat systemic imaging prior to that visit.  We discussed that while we will continue to participate in the discussion and review of his scans in the multidisciplinary brain conference, at this point, we will plan to see him back on an  as-needed basis.  He knows that he is welcome to call at anytime with any questions or concerns related to his previous radiation.    Nicholos Johns, PA-C

## 2022-02-26 ENCOUNTER — Ambulatory Visit
Admission: RE | Admit: 2022-02-26 | Discharge: 2022-02-26 | Disposition: A | Discharge status: Home / Self Care | Payer: Medicaid Other | Source: Ambulatory Visit | Attending: Urology | Admitting: Urology

## 2022-02-26 DIAGNOSIS — C7931 Secondary malignant neoplasm of brain: Secondary | ICD-10-CM

## 2022-03-13 NOTE — Progress Notes (Signed)
[facility]  [address] [place],  [place]  [address] [phone]  Clinic Day:  03/14/2022  Referring physician: Nicoletta Dress, MD  HISTORY OF PRESENT ILLNESS:  The patient is a 66 y.o. male with limited stage small cell lung cancer.  He comes in today to go over his chest CT to ascertain his new disease baseline.  Since his last visit, the patient has been doing better.  He has gotten stronger to where physical therapy will begin working with him in the very near future.  As it pertains to his disease, he denies having shortness of breath, hemoptysis, or other respiratory symptoms which concern him for disease recurrence.  Of note, he is scheduled for his next brain MRI in late July 2023.  He did have 1 cystic lesion in his left brain, whose cytology came back negative for malignancy.  However, 2 small brain lesions were seen that were suspicious for the early development of CNS metastasis.  With respect to his small cell lung cancer treatment history, he completed definitive chemoradiation, which consisted of 4 cycles of cisplatin/etoposide in October 2021.  Shortly thereafter, a brain MRI showed a large, isolated cystic brain lesion in his left frontoparietal lobe, which has led to mild right-sided neurological deficits.  Surgical evaluation of that site  ultimately yielded fluid that was sent to pathology.  Fortunately, this cystic lesion did not have any evidence of malignant cells.  In addition to this, a brain MRI done after his surgery showed that this cystic lesion had significantly decreased in size from surgical intervention.  Since the surgery has occurred, this gentleman has regained almost all of his right-sided body's strength and function.  PHYSICAL EXAM:  Blood pressure (!) 162/110, pulse (!) 119, temperature 98 F (36.7 C), resp. rate 16, height 5\' 7"  (1.702 m), weight 136 lb 4.8 oz (61.8 kg), SpO2 98 %. Wt Readings from Last 3 Encounters:   03/14/22 136 lb 4.8 oz (61.8 kg)  02/11/22 140 lb 6.4 oz (63.7 kg)  01/13/22 140 lb 12.8 oz (63.9 kg)   Body mass index is 21.35 kg/m. Performance status (ECOG):  0 Physical Exam Constitutional:      General: He is not in acute distress.    Appearance: He is not ill-appearing.     Comments: He looks stronger and has gained weight vs previous visits.  He walks in today on his own power.  He does have a chronically ill appearance with a moon facies.   HENT:     Head: Normocephalic.     Nose: Nose normal.     Mouth/Throat:     Mouth: Mucous membranes are moist.     Pharynx: Oropharynx is clear.  Cardiovascular:     Rate and Rhythm: Regular rhythm. Tachycardia present.     Heart sounds: No murmur heard.    No friction rub. No gallop.  Pulmonary:     Effort: Pulmonary effort is normal.     Breath sounds: Normal breath sounds.  Abdominal:     General: Bowel sounds are normal. There is no distension.     Palpations: Abdomen is soft.     Tenderness: There is no abdominal tenderness.  Musculoskeletal:     Cervical back: No rigidity.     Right lower leg: No edema.     Left lower leg: No edema.  Skin:    General: Skin is warm and dry.     Findings: No bruising, erythema or  rash.  Neurological:     General: No focal deficit present.     Mental Status: He is oriented to person, place, and time.  SCANS:  His chest CT done yesterday revealed the following: FINDINGS: Cardiovascular: Calcified and noncalcified atheromatous plaque of the thoracic aorta. No aneurysmal dilation. Normal heart size without pericardial effusion. Normal caliber of central pulmonary vessels. RIGHT-sided Port-A-Cath terminates at the caval to atrial junction. No pericardial effusion or thickening.  Mediastinum/Nodes: Stranding about and thickening of the mid esophagus is similar to the prior study. There is no adenopathy in the chest.  Lungs/Pleura: Pulmonary emphysema similar to prior imaging. No  lobar consolidation. No sign of pleural effusion. Post treatment changes along the LEFT mediastinal border compatible with fibrosis and mild volume loss similar to prior imaging.  RIGHT lower lobe mass seen initially on more remote studies has resolved with only minimal scarring and bandlike opacity remaining the area of the medial RIGHT lung base.  Superior segment RIGHT lower lobe nodule seen on the previous study also with bandlike changes in the area having resolved since the previous study.  Along the pleural surface in the RIGHT lung base there is a small pulmonary granuloma. Anterior to this was a third pulmonary nodule that has resolved as has a fourth nodule that was seen in the RIGHT lower lobe on the previous study. There is however a new area of nodularity along the major fissure just above the RIGHT hemidiaphragm in the RIGHT lower lobe (image 27/601) this is approximately 9 mm and is in the inferior costodiaphragmatic sulcus just above the RIGHT hemidiaphragm.  Bandlike opacities are present in the LEFT chest in the lingula and inferior LEFT upper lobe.  Upper Abdomen: Incidental imaging of upper abdominal contents shows mild hepatic steatosis. No acute findings relative to visualized liver, gallbladder, pancreas, spleen, adrenal glands or kidneys. No acute findings relative to visualized gastrointestinal tract.  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 No acute or destructive bone findings.  IMPRESSION: 1. Post treatment changes in the chest and evidence of marked response to therapy particularly when compared to the study of February of 2013. Existing masses and nodules in the RIGHT lower lobe are no longer evident. Only bandlike scarring is present in the medial RIGHT lung base as well as the LEFT upper lobe and lingula. Improvement includes the  superior segment nodule that was seen on the March exam that was reported as new. This is also no longer visible. 2. New area of nodularity appears more bandlike in the sagittal plane just above the RIGHT hemidiaphragm favored to represent mild atelectasis or scarring. Suggest attention on follow-up. Appearing more nodular in coronal and axial planes. 3. Pulmonary emphysema and aortic atherosclerosis. 4. Mild hepatic steatosis. 5. 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  Aortic Atherosclerosis (ICD10-I70.0) and Emphysema (ICD10-J43.9).  ASSESSMENT & PLAN:  Assessment/Plan:  A 66 y.o. male with a history of limited stage small cell lung cancer.  In clinic today, I went over his chest CT images with him, for which he could see that essentially all of his previous inflammatory nodules have dissipated.  There appear to be no signs of recurrent small cell disease within his lungs.  Understandably, the patient and his wife were pleased with the results.  Moving forward, the main concern is how his small cell lung cancer is doing within his  brain.  As mentioned previously, he is scheduled for his next brain MRI in July 2023 per neuro-oncology in [place].  I will see this patient back in 2 months for repeat clinical assessment.  A chest x-ray will be done on the day of his next visit for his continued lung cancer surveillance.  The patient and his wife understand all the plans discussed today and are in agreement with them.    Lenore Moyano Macarthur Critchley, MD

## 2022-03-14 ENCOUNTER — Other Ambulatory Visit: Payer: Self-pay | Admitting: Hematology and Oncology

## 2022-03-14 ENCOUNTER — Inpatient Hospital Stay: Payer: Medicaid Other | Attending: Oncology | Admitting: Oncology

## 2022-03-14 ENCOUNTER — Other Ambulatory Visit: Payer: Self-pay | Admitting: Oncology

## 2022-03-14 VITALS — BP 162/110 | HR 119 | Temp 98.0°F | Resp 16 | Ht 67.0 in | Wt 136.3 lb

## 2022-03-14 DIAGNOSIS — C3490 Malignant neoplasm of unspecified part of unspecified bronchus or lung: Secondary | ICD-10-CM

## 2022-03-14 MED ORDER — ONDANSETRON HCL 4 MG PO TABS: 4.0000 mg | ORAL_TABLET | ORAL | 3 refills | Status: DC | PRN

## 2022-03-16 ENCOUNTER — Encounter: Payer: Self-pay | Admitting: Oncology

## 2022-03-21 ENCOUNTER — Encounter: Payer: Self-pay | Admitting: Oncology

## 2022-03-28 ENCOUNTER — Telehealth: Payer: Self-pay

## 2022-03-28 NOTE — Telephone Encounter (Signed)
Patient's spouse called about possible side effects to the Lamictal. These include worsening loss of appetite and nausea feeling that the Zofran is not helping. She would like to speak with Dr. Mickeal Skinner about options for this.

## 2022-04-04 DIAGNOSIS — R630 Anorexia: Secondary | ICD-10-CM | POA: Diagnosis not present

## 2022-04-10 DIAGNOSIS — R293 Abnormal posture: Secondary | ICD-10-CM | POA: Diagnosis not present

## 2022-04-10 DIAGNOSIS — R2681 Unsteadiness on feet: Secondary | ICD-10-CM | POA: Diagnosis not present

## 2022-04-10 DIAGNOSIS — M6281 Muscle weakness (generalized): Secondary | ICD-10-CM | POA: Diagnosis not present

## 2022-04-10 DIAGNOSIS — C3490 Malignant neoplasm of unspecified part of unspecified bronchus or lung: Secondary | ICD-10-CM | POA: Diagnosis not present

## 2022-04-15 DIAGNOSIS — C3490 Malignant neoplasm of unspecified part of unspecified bronchus or lung: Secondary | ICD-10-CM | POA: Diagnosis not present

## 2022-04-15 DIAGNOSIS — R293 Abnormal posture: Secondary | ICD-10-CM | POA: Diagnosis not present

## 2022-04-15 DIAGNOSIS — M6281 Muscle weakness (generalized): Secondary | ICD-10-CM | POA: Diagnosis not present

## 2022-04-15 DIAGNOSIS — R2681 Unsteadiness on feet: Secondary | ICD-10-CM | POA: Diagnosis not present

## 2022-04-17 DIAGNOSIS — M6281 Muscle weakness (generalized): Secondary | ICD-10-CM | POA: Diagnosis not present

## 2022-04-17 DIAGNOSIS — R293 Abnormal posture: Secondary | ICD-10-CM | POA: Diagnosis not present

## 2022-04-17 DIAGNOSIS — C3490 Malignant neoplasm of unspecified part of unspecified bronchus or lung: Secondary | ICD-10-CM | POA: Diagnosis not present

## 2022-04-17 DIAGNOSIS — R2681 Unsteadiness on feet: Secondary | ICD-10-CM | POA: Diagnosis not present

## 2022-04-25 ENCOUNTER — Ambulatory Visit
Admission: RE | Admit: 2022-04-25 | Discharge: 2022-04-25 | Disposition: A | Discharge status: Home / Self Care | Payer: Medicaid Other | Source: Ambulatory Visit | Attending: Internal Medicine | Admitting: Internal Medicine

## 2022-04-25 ENCOUNTER — Encounter: Payer: Self-pay | Admitting: Oncology

## 2022-04-25 DIAGNOSIS — C7931 Secondary malignant neoplasm of brain: Secondary | ICD-10-CM

## 2022-04-25 DIAGNOSIS — C3490 Malignant neoplasm of unspecified part of unspecified bronchus or lung: Secondary | ICD-10-CM | POA: Diagnosis not present

## 2022-04-25 DIAGNOSIS — Z95828 Presence of other vascular implants and grafts: Secondary | ICD-10-CM

## 2022-04-25 DIAGNOSIS — G936 Cerebral edema: Secondary | ICD-10-CM | POA: Diagnosis not present

## 2022-04-25 MED ORDER — SODIUM CHLORIDE 0.9% FLUSH
10.0000 mL | INTRAVENOUS | Status: DC | PRN
  Administered 2022-04-25: 10 mL via INTRAVENOUS

## 2022-04-25 MED ORDER — GADOBENATE DIMEGLUMINE 529 MG/ML IV SOLN
13.0000 mL | Freq: Once | INTRAVENOUS | Status: AC | PRN
  Administered 2022-04-25: 13 mL via INTRAVENOUS

## 2022-04-25 MED ORDER — HEPARIN SOD (PORK) LOCK FLUSH 100 UNIT/ML IV SOLN
500.0000 [IU] | Freq: Once | INTRAVENOUS | Status: AC
  Administered 2022-04-25: 500 [IU] via INTRAVENOUS

## 2022-04-28 ENCOUNTER — Inpatient Hospital Stay: Payer: Medicare HMO | Attending: Oncology | Admitting: Internal Medicine

## 2022-04-28 ENCOUNTER — Other Ambulatory Visit: Payer: Self-pay

## 2022-04-28 ENCOUNTER — Inpatient Hospital Stay: Payer: Medicare HMO

## 2022-04-28 VITALS — BP 160/113 | HR 120 | Temp 98.7°F | Resp 15 | Ht 67.0 in | Wt 128.2 lb

## 2022-04-28 DIAGNOSIS — R569 Unspecified convulsions: Secondary | ICD-10-CM | POA: Diagnosis not present

## 2022-04-28 DIAGNOSIS — C3490 Malignant neoplasm of unspecified part of unspecified bronchus or lung: Secondary | ICD-10-CM | POA: Insufficient documentation

## 2022-04-28 DIAGNOSIS — Z79899 Other long term (current) drug therapy: Secondary | ICD-10-CM | POA: Diagnosis not present

## 2022-04-28 DIAGNOSIS — F17210 Nicotine dependence, cigarettes, uncomplicated: Secondary | ICD-10-CM | POA: Insufficient documentation

## 2022-04-28 DIAGNOSIS — C7931 Secondary malignant neoplasm of brain: Secondary | ICD-10-CM

## 2022-04-28 NOTE — Progress Notes (Signed)
[facility] [address], [place] [address] [phone]   Interval Evaluation  Date of Service: 04/28/22 Patient Name: Spencer Butler Patient MRN: [id number] Patient DOB: 04/10/1956 Provider: Ventura Sellers, MD  Identifying Statement:  Spencer Butler is a 66 y.o. 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[place]) - Plan: MR BRAIN W WO CONTRAST  Metastasis to brain [facility]) - Plan: MR BRAIN W WO CONTRAST   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[place])  06/19/2020 -  Chemotherapy   The patient had palonosetron (ALOXI) injection 0.25 mg, 0.25 mg, Intravenous,  Once, 4 of 4 cycles Administration: 0.25 mg (07/10/2020), 0.25 mg (08/07/2020), 0.25 mg (09/04/2020) pegfilgrastim-bmez (ZIEXTENZO) injection 6 mg, 6 mg, Subcutaneous,  Once, 2 of 2 cycles Administration: 6 mg (09/07/2020) CISplatin (PLATINOL) 120 mg in sodium chloride 0.9 % 500 mL chemo infusion, 74.5 mg/m2 = 120 mg (original dose ), Intravenous,  Once, 4 of 4 cycles Dose modification: 74.5 mg/m2 (Cycle 1, Reason: Other (see comments), Comment: Transcribed from mosaiq), 75 mg/m2 (original dose 75 mg/m2, Cycle 2, Reason: Other (see comments), Comment: Transcribed from mosaiq) Administration: 121 mg (07/10/2020), 121 mg (08/07/2020), 121 mg (09/04/2020) etoposide (VEPESID) 160 mg in sodium chloride 0.9 % 500 mL chemo infusion, 100 mg/m2, Intravenous,  Once, 4 of 4 cycles Administration: 160 mg (07/10/2020), 160 mg (07/11/2020), 160 mg (07/12/2020), 160 mg (08/07/2020), 160 mg (08/08/2020), 160 mg (08/09/2020), 160 mg (09/04/2020), 160 mg (09/05/2020), 160 mg (09/06/2020) fosaprepitant (EMEND) 150 mg in sodium chloride 0.9 % 145 mL IVPB, 150 mg, Intravenous,  Once, 4 of 4 cycles Administration: 150 mg (07/10/2020), 150 mg (08/07/2020), 150 mg (09/04/2020)  for chemotherapy treatment.    07/01/2020 Initial Diagnosis   Malignant neoplasm of  mediastinum, part unspecified ([place])   Small cell lung cancer ([place])  06/12/2020 Cancer Staging   Staging form: Lung, AJCC 8th Edition - Clinical stage from 06/12/2020: Stage IIIA (cT4, cN0, cM0) - Signed by Marice Potter, MD on 10/03/2020   10/03/2020 Initial Diagnosis   Small cell lung cancer [facility])    CNS Oncologic History: 10/08/21: P/w large cystic L parietal met, completes WBRT Orlene Erm) 01/08/22: Cystic progression, Ommaya drain placed by Dr. Zada Finders 01/24/22: Salvage SRS x4 Tammi Klippel)  Interval History: Spencer Butler presents today for follow up after recent MRI brain.  He mainly complains of poor appetite and weight loss today.  He is on both megace and dronabinol for appetite.  His walking has actually improved, now using the rolling walker instead of the wheelchair for the most part.  Fatigue continues to be a problem, as is short term memory.  He does describe some near daily headaches without migrainous features.    H+P (02/11/22) Patient presents today for follow up after recent [place] with Dr. Tammi Klippel.  He reports significant improvement in his right sided weakness following cyst aspiration, ommaya placement.  He does walk a little on his own, but uses a walker or wheelchair for longer outings.  He continues to work with PT.  Takes care of most needs independently at home.  Did have one seizure earlier this month, right leg shaking.  No recurrence since dosing the Keppra 59m BID, but he complains of fatigue and irritability with the medication.  Medications: Current Outpatient Medications on File Prior to Visit  Medication Sig Dispense Refill   lamoTRIgine (LAMICTAL) 25 MG tablet Take 2 tablets (50 mg total) by mouth daily. 60 tablet  3   megestrol (MEGACE) 40 MG tablet Take 40 mg by mouth daily.     ondansetron (ZOFRAN) 4 MG tablet Take 1 tablet (4 mg total) by mouth every 4 (four) hours as needed for nausea. 90 tablet 3   No current facility-administered medications on file  prior to visit.    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[place])    Malignant neoplasm of mediastinum, part unspecified ([place])    Malignant neoplasm of mediastinum, part unspecified ([place])    Past Surgical History:  Past Surgical History:  Procedure Laterality Date   APPLICATION OF CRANIAL NAVIGATION N/A 01/08/2022   Procedure: APPLICATION OF CRANIAL NAVIGATION;  Surgeon: Judith Part, MD;  Location: [place];  Service: Neurosurgery;  Laterality: N/A;   [place] HOLE Left 01/08/2022   Procedure: Left Ommaya placement with brainlab placement ventricular catheter;  Surgeon: Judith Part, MD;  Location: [place];  Service: Neurosurgery;  Laterality: Left;   Social History:  Social History   Socioeconomic History   Marital status: Married    Spouse name: Not on file   Number of children: Not on file   Years of education: Not on file   Highest education level: Not on file  Occupational History   Not on file  Tobacco Use   Smoking status: Every Day    Packs/day: 0.50    Types: Cigarettes   Smokeless tobacco: Never  Vaping Use   Vaping Use: Never used  Substance and Sexual Activity   Alcohol use: Never   Drug use: Never   Sexual activity: Not on file  Other Topics Concern   Not on file  Social History Narrative   Not on file   Social Determinants of Health   Financial Resource Strain: Not on file  Food Insecurity: Not on file  Transportation Needs: Not on file  Physical Activity: Not on file  Stress: Not on file  Social Connections: Not on file  Intimate Partner Violence: Not on file   Family History: No family history on file.  Review of Systems: Constitutional: Doesn't report fevers, chills or abnormal weight loss Eyes: Doesn't report blurriness of vision Ears, nose, mouth, throat, and face: Doesn't report sore throat Respiratory: Doesn't report cough,  dyspnea or wheezes Cardiovascular: Doesn't report palpitation, chest discomfort  Gastrointestinal:  Doesn't report nausea, constipation, diarrhea GU: Doesn't report incontinence Skin: Doesn't report skin rashes Neurological: Per HPI Musculoskeletal: Doesn't report joint pain Behavioral/Psych: Doesn't report anxiety  Physical Exam: Vitals:   04/28/22 1054  BP: (!) 160/113  Pulse: (!) 120  Resp: 15  Temp: 98.7 F (37.1 C)  SpO2: 100%   KPS: 70. General: Alert, cooperative, pleasant, in no acute distress Head: Normal EENT: No conjunctival injection or scleral icterus.  Lungs: Resp effort normal Cardiac: Regular rate Abdomen: Non-distended abdomen Skin: No rashes cyanosis or petechiae. Extremities: No clubbing or edema  Neurologic Exam: Mental Status: Awake, alert, attentive to examiner. Oriented to self and environment. Language is fluent with intact comprehension. Psychomotor slowing. Cranial Nerves: Visual acuity is grossly normal. Visual fields are full. Extra-ocular movements intact. No ptosis. Face is symmetric Motor: Tone and bulk are normal. Power is 4+/5 in right arm and leg. Reflexes are symmetric, no pathologic reflexes present.  Sensory: Intact to light touch Gait: Hemiparetic   Labs: I have reviewed the data as listed    Component Value Date/Time   NA 137  11/27/2021 0000   K 4.2 11/27/2021 0000   CL 111 (A) 11/27/2021 0000   CO2 15 11/27/2021 0000   GLUCOSE 84 08/06/2020 1129   BUN 16 11/27/2021 0000   CREATININE 0.71 01/08/2022 1226   CREATININE 0.90 08/06/2020 1129   CALCIUM 9.2 11/27/2021 0000   PROT 6.7 08/06/2020 1129   ALBUMIN 3.4 (A) 11/27/2021 0000   AST 40 11/27/2021 0000   AST 14 (L) 08/06/2020 1129   ALT 38 11/27/2021 0000   ALT 9 08/06/2020 1129   ALKPHOS 96 11/27/2021 0000   BILITOT 0.6 08/06/2020 1129   GFRNONAA >60 01/08/2022 1226   GFRNONAA >60 08/06/2020 1129   Lab Results  Component Value Date   WBC 7.9 01/08/2022   NEUTROABS  6.10 11/27/2021   HGB 13.0 01/08/2022   HCT 39.9 01/08/2022   MCV 89.7 01/08/2022   PLT 135 (L) 01/08/2022   Imaging:  [place] Clinician Interpretation: I have personally reviewed the CNS images as listed.  My interpretation, in the context of the patient's clinical presentation, is likely treatment effect  MR BRAIN W WO CONTRAST  Result Date: 04/25/2022 CLINICAL DATA:  Brain metastases, assess treatment response. Lung cancer. EXAM: MRI HEAD WITHOUT AND WITH CONTRAST TECHNIQUE: Multiplanar, multiecho pulse sequences of the brain and surrounding structures were obtained without and with intravenous contrast. CONTRAST:  42m MULTIHANCE GADOBENATE DIMEGLUMINE 529 MG/ML IV SOLN COMPARISON:  MR head without and with contrast 01/20/2022. FINDINGS: Brain: Multiple enhancing lesions have increased in size consistent with progressive disease. Lesion the right parietal lobe is increased in size, now measuring 22 x 18 x 16 mm. Extensive surrounding vasogenic edema has increased. Right occipital lesion is increased in size to 6 mm with progressive surrounding edema. Lesion in the anterior right frontal lobe white matter on image 97 of series 11 measures 5 mm. A 3 mm lesion is noted in the left parietal lobe just anterior to the drained left cyst. Diffuse confluent T2 changes are present throughout the white matter bilaterally. T2 changes the brainstem are stable. No acute infarct or focal hemorrhage is present. Vascular: Flow is present in the major intracranial arteries. Skull and upper cervical spine: The craniocervical junction is normal. Upper cervical spine is within normal limits. Marrow signal is unremarkable. Sinuses/Orbits: Bilateral mastoid effusions likely related treatment. The paranasal sinuses and mastoid air cells are otherwise clear. The globes and orbits are within normal limits. IMPRESSION: 1. Progression of disease as described. 2. Bilateral mastoid effusions likely related to treatment. 3. Diffuse  confluent T2 changes throughout the white matter bilaterally are stable. This likely reflects the sequela of whole brain radiation. Electronically Signed   By: CSan MorelleM.D.   On: 04/25/2022 21:27      Assessment/Plan [place] - Plan: MR BRAIN W WO CONTRAST  Metastasis to brain ([facility] - Plan: MR BRAIN W WO CONTRAST  VMandell Pangbornpresents today with mixed clinical picture, some aspects have improved and others may be worse than prior visit.  Gait is improved but energy, appetite, short term memory are worse.  Brain MRI demonstrates progression of disease, mainly within twice treated right parietal metastasis.  SRS and WBRT have both been administered within past 6 months in this location.  Given treatment history, rate of volume of enhancement change, relative lack of symptoms, this is most suggestive of radiation treatment effect.  We recommended initiating dexamethasone 841mBID x4 days, then 86m49maily x4 days, then 2mg7mily thereafter if tolerated.  We recommended continuing Lamictal 38m daily for AED.    We ask that Spencer Ellermanreturn to clinic in 2 months following next brain MRI, or sooner as needed.  We appreciate the opportunity to participate in the care of Spencer Butler   All questions were answered. The patient knows to call the clinic with any problems, questions or concerns. No barriers to learning were detected.  The total time spent in the encounter was 40 minutes and more than 50% was on counseling and review of test results   ZVentura Sellers MD Medical Director of Neuro-Oncology [facility]07/31/23 11:35 AM

## 2022-04-29 ENCOUNTER — Telehealth: Payer: Self-pay | Admitting: Internal Medicine

## 2022-04-29 ENCOUNTER — Other Ambulatory Visit: Payer: Self-pay | Admitting: Radiation Therapy

## 2022-04-29 NOTE — Telephone Encounter (Signed)
Scheduled per 7/31 los, message has been left

## 2022-05-14 ENCOUNTER — Other Ambulatory Visit: Payer: Self-pay | Admitting: Oncology

## 2022-05-14 ENCOUNTER — Inpatient Hospital Stay: Payer: Medicare HMO | Attending: Oncology | Admitting: Oncology

## 2022-05-14 VITALS — BP 162/107 | HR 107 | Temp 98.1°F | Resp 16 | Ht 67.0 in | Wt 129.0 lb

## 2022-05-14 DIAGNOSIS — I700 Atherosclerosis of aorta: Secondary | ICD-10-CM | POA: Diagnosis not present

## 2022-05-14 DIAGNOSIS — C3490 Malignant neoplasm of unspecified part of unspecified bronchus or lung: Secondary | ICD-10-CM | POA: Diagnosis not present

## 2022-05-14 DIAGNOSIS — J439 Emphysema, unspecified: Secondary | ICD-10-CM | POA: Diagnosis not present

## 2022-05-14 DIAGNOSIS — J432 Centrilobular emphysema: Secondary | ICD-10-CM | POA: Diagnosis not present

## 2022-05-14 NOTE — Progress Notes (Signed)
[facility]  [address] [place],  [place]  [address] [phone]  Clinic Day:  05/14/2022  Referring physician: Nicoletta Dress, MD  HISTORY OF PRESENT ILLNESS:  The patient is a 66 y.o. male with limited stage small cell lung cancer.  He comes in today for routine follow-up, as well as to review his current chest x-ray.  Since his last visit, the patient has been doing much better.  He was recently seen by Dr. Mickeal Skinner, who placed him on a prolonged Decadron taper as there was increased vasogenic edema around his right parietal lobe lesion.  The question is whether this lesion represents metastatic disease or postradiation changes.  There were other small lesions in his brain that are being followed closely.  Since being placed on the steroids, the patient has noticed a significant improvement in his baseline health.  He no longer has headaches.  He also cannot remember the last time he has had a seizure.  In addition to Decadron, the patient is taking Lamictal to prevent additional seizures from forming.  As it pertains to his small cell lung cancer, he denies having any new respiratory symptoms that are concerning for disease recurrence.  With respect to his small cell lung cancer treatment history, he completed definitive chemoradiation, which consisted of 4 cycles of cisplatin/etoposide in October 2021.  Shortly thereafter, a brain MRI showed a large, isolated cystic brain lesion in his left frontoparietal lobe, which has led to mild right-sided neurological deficits.  Surgical evaluation of that site ultimately yielded fluid that was sent to pathology.  Fortunately, this cystic lesion did not have any evidence of malignant cells.  Since brain MRIs since this procedure have shown this cystic lesion to have significantly diminished in size.   PHYSICAL EXAM:  Blood pressure (!) 162/107, pulse (!) 107, temperature 98.1 F (36.7 C), resp. rate 16, height 5\' 7"   (1.702 m), weight 129 lb (58.5 kg), SpO2 97 %. Wt Readings from Last 3 Encounters:  05/14/22 129 lb (58.5 kg)  04/28/22 128 lb 3.2 oz (58.2 kg)  03/14/22 136 lb 4.8 oz (61.8 kg)   Body mass index is 20.2 kg/m. Performance status (ECOG):  0 Physical Exam Constitutional:      General: He is not in acute distress.    Appearance: He is not ill-appearing.     Comments: He looks stronger and has gained weight vs previous visits.  He walks in today on his own power.  He does have a chronically ill appearance with a moon facies.   HENT:     Head: Normocephalic.     Nose: Nose normal.     Mouth/Throat:     Mouth: Mucous membranes are moist.     Pharynx: Oropharynx is clear.  Cardiovascular:     Rate and Rhythm: Regular rhythm. Tachycardia present.     Heart sounds: No murmur heard.    No friction rub. No gallop.  Pulmonary:     Effort: Pulmonary effort is normal.     Breath sounds: Normal breath sounds.  Abdominal:     General: Bowel sounds are normal. There is no distension.     Palpations: Abdomen is soft.     Tenderness: There is no abdominal tenderness.  Musculoskeletal:     Cervical back: No rigidity.     Right lower leg: No edema.     Left lower leg: No edema.  Skin:    General: Skin is warm and dry.  Findings: No bruising, erythema or rash.  Neurological:     General: No focal deficit present.     Mental Status: He is oriented to person, place, and time.   SCANS:  His chest C x-ray done today showed no masses, effusions or other findings to suggest disease recurrence.  ASSESSMENT & PLAN:  Assessment/Plan:  A 66 y.o. male with a history of limited stage small cell lung cancer.  In clinic today, I went over his chest x-ray images with him, which showed that his lungs are essentially clear of any type of disease.  Clinically, this patient is doing better.  It definitely appears the steroids recently started for the vasogenic edema associated with his right parietal lesion  have helped tremendously.  He is scheduled for another brain MRI in the forthcoming months.  I will see this patient back in 2 months for repeat clinical assessment.  A chest CT will be done ad ay before his next visit to ensure there remains no radiographic evidence of disease recurrence.  The patient and his wife understand all the plans discussed today and are in agreement with them.    Spencer Zingale Macarthur Critchley, MD

## 2022-05-16 DIAGNOSIS — R2681 Unsteadiness on feet: Secondary | ICD-10-CM | POA: Diagnosis not present

## 2022-05-16 DIAGNOSIS — M6281 Muscle weakness (generalized): Secondary | ICD-10-CM | POA: Diagnosis not present

## 2022-05-16 DIAGNOSIS — C3490 Malignant neoplasm of unspecified part of unspecified bronchus or lung: Secondary | ICD-10-CM | POA: Diagnosis not present

## 2022-05-16 DIAGNOSIS — R293 Abnormal posture: Secondary | ICD-10-CM | POA: Diagnosis not present

## 2022-05-19 ENCOUNTER — Encounter: Payer: Self-pay | Admitting: Oncology

## 2022-05-26 ENCOUNTER — Encounter: Payer: Self-pay | Admitting: Podiatry

## 2022-05-26 ENCOUNTER — Ambulatory Visit: Payer: Medicare HMO | Admitting: Podiatry

## 2022-05-26 DIAGNOSIS — M79675 Pain in left toe(s): Secondary | ICD-10-CM

## 2022-05-26 DIAGNOSIS — B351 Tinea unguium: Secondary | ICD-10-CM | POA: Diagnosis not present

## 2022-05-26 DIAGNOSIS — M79674 Pain in right toe(s): Secondary | ICD-10-CM

## 2022-05-26 NOTE — Progress Notes (Signed)
  Subjective:  Patient ID: Spencer Butler, male    DOB: 1956/05/02,   MRN: [id number]  Chief Complaint  Patient presents with   Nail Problem    NP   thick painful toenails    66 y.o. male presents for concern of thickened elongated and painful nails that are difficult to trim. Requesting to have them trimmed today. He is not diabetic and not on any blood thinners.   PCP:  Nicoletta Dress, MD    . Denies any other pedal complaints. Denies n/v/f/c.   Past Medical History:  Diagnosis Date   Malignant neoplasm of mediastinum, part unspecified ([place])    Malignant neoplasm of mediastinum, part unspecified ([place])    Malignant neoplasm of mediastinum, part unspecified ([place])     Objective:  Physical Exam: Vascular: DP/PT pulses 2/4 bilateral. CFT <3 seconds. Normal hair growth on digits. No edema.  Skin. No lacerations or abrasions bilateral feet. Nails 1-5 b/l are thickened discolored, elongated and with subungual debris.  Musculoskeletal: MMT 5/5 bilateral lower extremities in DF, PF, Inversion and Eversion. Deceased ROM in DF of ankle joint.  Neurological: Sensation intact to light touch.   Assessment:   1. Pain due to onychomycosis of toenails of both feet      Plan:  Patient was evaluated and treated and all questions answered. -Discussed and educated patient on foot care, especially with  regards to the vascular, neurological and musculoskeletal systems.  -Discussed supportive shoes at all times and checking feet regularly.  -Mechanically debrided all nails 1-5 bilateral using sterile nail nipper and filed with dremel without incident as courtesy this visit.  -Answered all patient questions -Patient to return  in 3 months for rfc.  -Patient advised to call the office if any problems or questions arise in the meantime.   Lorenda Peck, DPM

## 2022-06-15 ENCOUNTER — Other Ambulatory Visit: Payer: Self-pay | Admitting: Internal Medicine

## 2022-06-24 DIAGNOSIS — H524 Presbyopia: Secondary | ICD-10-CM | POA: Diagnosis not present

## 2022-06-25 ENCOUNTER — Other Ambulatory Visit: Payer: Self-pay | Admitting: Radiation Therapy

## 2022-06-25 DIAGNOSIS — C7931 Secondary malignant neoplasm of brain: Secondary | ICD-10-CM

## 2022-06-25 NOTE — Progress Notes (Signed)
Orders entered for port access and de-access the day of brain MRI at [facility].   Mont Dutton R.T.(R)(T) Radiation Special Procedures Navigator

## 2022-06-27 ENCOUNTER — Ambulatory Visit
Admission: RE | Admit: 2022-06-27 | Discharge: 2022-06-27 | Disposition: A | Discharge status: Home / Self Care | Payer: Medicare HMO | Source: Ambulatory Visit | Attending: Internal Medicine | Admitting: Internal Medicine

## 2022-06-27 DIAGNOSIS — G936 Cerebral edema: Secondary | ICD-10-CM | POA: Diagnosis not present

## 2022-06-27 DIAGNOSIS — R569 Unspecified convulsions: Secondary | ICD-10-CM

## 2022-06-27 DIAGNOSIS — G930 Cerebral cysts: Secondary | ICD-10-CM | POA: Diagnosis not present

## 2022-06-27 DIAGNOSIS — C7931 Secondary malignant neoplasm of brain: Secondary | ICD-10-CM

## 2022-06-27 DIAGNOSIS — R9082 White matter disease, unspecified: Secondary | ICD-10-CM | POA: Diagnosis not present

## 2022-06-27 DIAGNOSIS — Z85118 Personal history of other malignant neoplasm of bronchus and lung: Secondary | ICD-10-CM | POA: Diagnosis not present

## 2022-06-27 MED ORDER — SODIUM CHLORIDE 0.9% FLUSH
10.0000 mL | INTRAVENOUS | Status: DC | PRN
  Administered 2022-06-27: 10 mL via INTRAVENOUS

## 2022-06-27 MED ORDER — GADOBENATE DIMEGLUMINE 529 MG/ML IV SOLN
12.0000 mL | Freq: Once | INTRAVENOUS | Status: AC | PRN
  Administered 2022-06-27: 12 mL via INTRAVENOUS

## 2022-06-27 MED ORDER — HEPARIN SOD (PORK) LOCK FLUSH 100 UNIT/ML IV SOLN
500.0000 [IU] | Freq: Once | INTRAVENOUS | Status: AC
  Administered 2022-06-27: 500 [IU] via INTRAVENOUS

## 2022-06-30 ENCOUNTER — Inpatient Hospital Stay: Payer: Medicare HMO | Attending: Oncology

## 2022-06-30 ENCOUNTER — Other Ambulatory Visit: Payer: Self-pay

## 2022-06-30 ENCOUNTER — Inpatient Hospital Stay (HOSPITAL_BASED_OUTPATIENT_CLINIC_OR_DEPARTMENT_OTHER): Payer: Medicare HMO | Admitting: Internal Medicine

## 2022-06-30 VITALS — BP 140/109 | HR 103 | Temp 98.1°F | Resp 16 | Ht 67.0 in | Wt 146.5 lb

## 2022-06-30 DIAGNOSIS — R569 Unspecified convulsions: Secondary | ICD-10-CM

## 2022-06-30 DIAGNOSIS — Z79899 Other long term (current) drug therapy: Secondary | ICD-10-CM | POA: Insufficient documentation

## 2022-06-30 DIAGNOSIS — C7931 Secondary malignant neoplasm of brain: Secondary | ICD-10-CM

## 2022-06-30 DIAGNOSIS — F17210 Nicotine dependence, cigarettes, uncomplicated: Secondary | ICD-10-CM | POA: Insufficient documentation

## 2022-06-30 DIAGNOSIS — C3490 Malignant neoplasm of unspecified part of unspecified bronchus or lung: Secondary | ICD-10-CM | POA: Diagnosis not present

## 2022-06-30 MED ORDER — DEXAMETHASONE 1 MG PO TABS: 2.0000 mg | ORAL_TABLET | Freq: Every day | ORAL | 1 refills | Status: DC

## 2022-06-30 MED ORDER — PANTOPRAZOLE SODIUM 20 MG PO TBEC: 20.0000 mg | DELAYED_RELEASE_TABLET | Freq: Every day | ORAL | 3 refills | Status: DC

## 2022-06-30 NOTE — Progress Notes (Signed)
[facility] [place], [place] [address] [phone]   Interval Evaluation  Date of Service: 06/30/22 Patient Name: Spencer Butler Patient MRN: [id number] Patient DOB: 1955/11/06 Provider: Ventura Sellers, MD  Identifying Statement:  Spencer Butler is a 66 y.o. 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 Zada Finders 01/24/22: Salvage SRS x4 Tammi Klippel)  Interval History: Spencer Butler presents today for follow up after recent MRI brain.  He describes marked improvement in weakness, gait since starting the steroid 2 months ago.  Now not needing a walker at all.  Fatigue continues to be a problem, as is short term memory.  Headaches are improved with the steroids as well.  H+P (02/11/22) Patient presents today for follow up after recent [place] with Dr. Tammi Klippel.  He reports significant improvement in his right sided weakness following cyst aspiration, ommaya placement.  He does walk a little on his own, but uses a walker or wheelchair for longer outings.  He continues to work with PT.  Takes care of most needs independently at home.  Did have one seizure earlier this month, right leg  shaking.  No recurrence since dosing the Keppra 581m BID, but he complains of fatigue and irritability with the medication.  Medications: Current Outpatient Medications on File Prior to Visit  Medication Sig Dispense Refill   dronabinol (MARINOL) 2.5 MG capsule Take 2.5 mg by mouth 2 (two) times daily.     lamoTRIgine (LAMICTAL) 25 MG tablet TAKE 2 TABLETS(50 MG) BY MOUTH DAILY 60 tablet 3   megestrol (MEGACE) 40 MG tablet Take 40 mg by mouth daily.     ondansetron (ZOFRAN) 4 MG tablet Take 1 tablet (4 mg total) by mouth every 4 (four) hours as needed for nausea. 90 tablet 3   No current facility-administered medications on file prior to visit.    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[place]    Malignant neoplasm of mediastinum, part unspecified ([place]    Malignant neoplasm of mediastinum, part unspecified ([place]    Past Surgical History:  Past Surgical History:  Procedure Laterality Date   APPLICATION OF CRANIAL NAVIGATION N/A 01/08/2022   Procedure: APPLICATION OF CRANIAL NAVIGATION;  Surgeon: OJudith Part MD;  [place] [facility]  Service: Neurosurgery;  Laterality: N/A;   BURR HOLE Left 01/08/2022   Procedure: Left Ommaya placement with brainlab placement ventricular catheter;  Surgeon: OJudith Part MD;  [place] [facility]  Service: Neurosurgery;  Laterality: Left;   Social History:  Social History   Socioeconomic History   Marital status: Married    Spouse name: Not on file  Number of children: Not on file   Years of education: Not on file   Highest education level: Not on file  Occupational History   Not on file  Tobacco Use   Smoking status: Every Day    Packs/day: 0.50    Types: Cigarettes   Smokeless tobacco: Never  Vaping Use   Vaping Use: Never used  Substance and Sexual Activity   Alcohol use: Never   Drug use: Never   Sexual activity: Not on  file  Other Topics Concern   Not on file  Social History Narrative   Not on file   Social Determinants of Health   Financial Resource Strain: Not on file  Food Insecurity: Not on file  Transportation Needs: Not on file  Physical Activity: Not on file  Stress: Not on file  Social Connections: Not on file  Intimate Partner Violence: Not on file   Family History: No family history on file.  Review of Systems: Constitutional: Doesn't report fevers, chills or abnormal weight loss Eyes: Doesn't report blurriness of vision Ears, nose, mouth, throat, and face: Doesn't report sore throat Respiratory: Doesn't report cough, dyspnea or wheezes Cardiovascular: Doesn't report palpitation, chest discomfort  Gastrointestinal:  Doesn't report nausea, constipation, diarrhea GU: Doesn't report incontinence Skin: Doesn't report skin rashes Neurological: Per HPI Musculoskeletal: Doesn't report joint pain Behavioral/Psych: Doesn't report anxiety  Physical Exam: Vitals:   06/30/22 1101  BP: (!) 140/109  Pulse: (!) 103  Resp: 16  Temp: 98.1 F (36.7 C)  SpO2: 100%    KPS: 70. General: Alert, cooperative, pleasant, in no acute distress Head: Normal EENT: No conjunctival injection or scleral icterus.  Lungs: Resp effort normal Cardiac: Regular rate Abdomen: Non-distended abdomen Skin: No rashes cyanosis or petechiae. Extremities: No clubbing or edema  Neurologic Exam: Mental Status: Awake, alert, attentive to examiner. Oriented to self and environment. Language is fluent with intact comprehension. Psychomotor slowing. Cranial Nerves: Visual acuity is grossly normal. Visual fields are full. Extra-ocular movements intact. No ptosis. Face is symmetric Motor: Tone and bulk are normal. Power is 4+/5 in right arm and leg. Reflexes are symmetric, no pathologic reflexes present.  Sensory: Intact to light touch Gait: Hemiparetic   Labs: I have reviewed the data as listed    Component  Value Date/Time   NA 137 11/27/2021 0000   K 4.2 11/27/2021 0000   CL 111 (A) 11/27/2021 0000   CO2 15 11/27/2021 0000   GLUCOSE 84 08/06/2020 1129   BUN 16 11/27/2021 0000   CREATININE 0.71 01/08/2022 1226   CREATININE 0.90 08/06/2020 1129   CALCIUM 9.2 11/27/2021 0000   PROT 6.7 08/06/2020 1129   ALBUMIN 3.4 (A) 11/27/2021 0000   AST 40 11/27/2021 0000   AST 14 (L) 08/06/2020 1129   ALT 38 11/27/2021 0000   ALT 9 08/06/2020 1129   ALKPHOS 96 11/27/2021 0000   BILITOT 0.6 08/06/2020 1129   GFRNONAA >60 01/08/2022 1226   GFRNONAA >60 08/06/2020 1129   Lab Results  Component Value Date   WBC 7.9 01/08/2022   NEUTROABS 6.10 11/27/2021   HGB 13.0 01/08/2022   HCT 39.9 01/08/2022   MCV 89.7 01/08/2022   PLT 135 (L) 01/08/2022   Imaging:  [place] Clinician Interpretation: I have personally reviewed the CNS images as listed.  My interpretation, in the context of the patient's clinical presentation, is treatment effect vs true progression  MR BRAIN W WO CONTRAST  Result Date: 06/29/2022 CLINICAL DATA:  Assess treatment response.  History of lung cancer EXAM: MRI HEAD WITHOUT AND WITH CONTRAST TECHNIQUE: Multiplanar, multiecho pulse sequences of the brain and surrounding structures were obtained without and with intravenous contrast. CONTRAST:  29m MULTIHANCE GADOBENATE DIMEGLUMINE 529 MG/ML IV SOLN COMPARISON:  04/25/2022 FINDINGS: BRAIN New Lesions: None. Larger lesions: Right parietal white matter lesion with areas of central non enhancement has enlarged to 2.6 cm, previously 2.1 cm in maximum. Evidence of adjacent vasogenic edema superimposed on the chronic white matter changes post whole-brain radiotherapy. Stable or Smaller lesions: Subcentimeter lesions in the anterior right frontal lobe (13:95) and right occipital lobe (13:87) are stable if not slightly contracted from prior. A left pericystic focus of nodular enhancement is no longer seen. Minimal enhancement at the upper margin of  this cyst is visible on 15:18, linear and not particularly worrisome. Other Brain findings: Confluent FLAIR hyperintensity in the cerebral white matter, pons, and deep cerebellar white matter is attributed to remote radiotherapy. No acute infarct, hydrocephalus, or collection. Left-sided catheter in unchanged position with unchanged associated partially collapsed cyst in the left frontal parietal region. Vascular: Major flow voids and vascular enhancements are preserved. Skull and upper cervical spine: Unremarkable Sinuses/Orbits: Unremarkable IMPRESSION: 1. A single lesion in the right parietal white matter continues to increase in size, now 2.6 cm. 2. Other lesions are stable or smaller.  No new lesion. 3. Stable white matter disease with vasogenic edema around the enlarging lesion. Electronically Signed   By: JJorje GuildM.D.   On: 06/29/2022 10:22      Assessment/Plan Metastasis to brain ([facility]  Focal seizure ([place]  VRosann Auerbachpresents today improved clinical and functional status, now s/p dexamethasone dosing.  MRI demonstrates improvement in T2/FLAIR burden, but continued progression of enhancing volume within (twice) previously treated R parietal lesion.  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  We ultimately recommended continuing decadron 227mdaily for now, and will repeat brain MRI in 1 month.  We recommended continuing Lamictal 5049maily for AED.  Will add protonix 57m76mr steroid associated reflux symptoms.  We ask that Spencer Butler to clinic in 1 months following next brain MRI, or sooner as needed.  He is agreeable with this plan.  We appreciate the opportunity to participate in the care of Spencer AuerbachAll questions were answered.  The patient knows to call the clinic with any problems, questions or concerns. No barriers to learning were detected.  The total time spent in the encounter was 40 minutes and more than 50% was on counseling and review of test results   ZachVentura Sellers Medical Director of Neuro-Oncology [facility] Rehabilitation CenterWeslSierra Brooks02/23 10:54 AM

## 2022-07-02 ENCOUNTER — Other Ambulatory Visit: Payer: Self-pay | Admitting: Radiation Therapy

## 2022-07-14 DIAGNOSIS — C3490 Malignant neoplasm of unspecified part of unspecified bronchus or lung: Secondary | ICD-10-CM | POA: Diagnosis not present

## 2022-07-14 DIAGNOSIS — J439 Emphysema, unspecified: Secondary | ICD-10-CM | POA: Diagnosis not present

## 2022-07-14 LAB — BASIC METABOLIC PANEL
BUN: 18 (ref 4–21)
CO2: 17 (ref 13–22)
Chloride: 109 — AB (ref 99–108)
Creatinine: 1 (ref 0.6–1.3)
Glucose: 91
Potassium: 4.2 mEq/L (ref 3.5–5.1)
Sodium: 135 — AB (ref 137–147)

## 2022-07-14 LAB — COMPREHENSIVE METABOLIC PANEL
Albumin: 4.1 (ref 3.5–5.0)
Calcium: 9.5 (ref 8.7–10.7)

## 2022-07-14 LAB — CBC AND DIFFERENTIAL
HCT: 49 (ref 41–53)
Hemoglobin: 15.9 (ref 13.5–17.5)
Neutrophils Absolute: 8.82
Platelets: 210 10*3/uL (ref 150–400)
WBC: 12.6

## 2022-07-14 LAB — HEPATIC FUNCTION PANEL
ALT: 35 U/L (ref 10–40)
AST: 30 (ref 14–40)
Alkaline Phosphatase: 75 (ref 25–125)
Bilirubin, Total: 0.8

## 2022-07-14 LAB — CBC: RBC: 5.57 — AB (ref 3.87–5.11)

## 2022-07-14 NOTE — Progress Notes (Signed)
[facility]  [address] [place],  [place]  [address] [phone]  Clinic Day:  07/15/2022  Referring physician: Nicoletta Dress, MD  HISTORY OF PRESENT ILLNESS:  The patient is a 66 y.o. 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  He has received whole brain radiation in January 2023.  He is also had salvage stereotactic radiation in April 2023 of a left parietal lesion.  He comes in today to go over his chest CT images to ensure there remains no evidence of disease recurrence.  Since his last visit, the patient has been doing well.  He has occasional sinus congestion, but denies having other respiratory symptoms which concern him for early disease recurrence.  Of note, he was recently seen in [place] by neurooncology.  An MRI done around that same time showed a right parietal lesion that had increased in size to 2.6 cm.  As there was believe this still may represent radionecrosis, the patient remains on steroids only.  He is scheduled to receive another brain MRI next month to reassess this lesion in question.  He currently denies having any left and headaches, vision, or balance changes which concern him for disease progression in his brain.  With respect to his small cell lung cancer treatment history, he completed definitive chemoradiation, which consisted of 4 cycles of cisplatin/etoposide in October 2021.  Due to growth of a left large, cystic parietal lesion, he underwent whole brain radiation in January 2023.  After this lesion grew in April 2023, it was surgically drained, with an Ommaya reservoir placed in this location.  He received 4 doses of salvage stereotactic radiation in April 2023.  Interestingly enough, the fluid from this cystic mass did not show any malignant cells.  PHYSICAL EXAM:  Blood pressure (!) 167/108, pulse 92, temperature 97.9 F (36.6 C), resp. rate 14, height 5\' 7"   (1.702 m), weight 147 lb 9.6 oz (67 kg), SpO2 97 %. Wt Readings from Last 3 Encounters:  07/15/22 147 lb 9.6 oz (67 kg)  06/30/22 146 lb 8 oz (66.5 kg)  05/14/22 129 lb (58.5 kg)   Body mass index is 23.12 kg/m. Performance status (ECOG):  0 Physical Exam Constitutional:      General: He is not in acute distress.    Appearance: He is not ill-appearing.     Comments: He looks stronger and has gained weight vs previous visits.  He walks in today on his own power.  He does have a chronically ill appearance with a moon facies.   HENT:     Head: Normocephalic.     Nose: Nose normal.     Mouth/Throat:     Mouth: Mucous membranes are moist.     Pharynx: Oropharynx is clear.  Cardiovascular:     Rate and Rhythm: Regular rhythm. Tachycardia present.     Heart sounds: No murmur heard.    No friction rub. No gallop.  Pulmonary:     Effort: Pulmonary effort is normal.     Breath sounds: Normal breath sounds.  Abdominal:     General: Bowel sounds are normal. There is no distension.     Palpations: Abdomen is soft.     Tenderness: There is no abdominal tenderness.  Musculoskeletal:     Cervical back: No rigidity.     Right lower leg: No edema.     Left lower leg: No edema.  Skin:  General: Skin is warm and dry.     Findings: No bruising, erythema or rash.  Neurological:     General: No focal deficit present.     Mental Status: He is oriented to person, place, and time.   SCANS:  His chest CT done yesterday revealed the following: FINDINGS: Cardiovascular: The heart is normal in size. No pericardial effusion.  No evidence of thoracic aortic aneurysm. Atherosclerotic calcifications of the aortic arch.  Coronary atherosclerosis of the LAD and right coronary artery.  Right chest port terminates at the cavoatrial junction.  Mediastinum/Nodes: No suspicious mediastinal lymphadenopathy.  Prominent soft tissue along the upper esophagus (series 2/image 26), similar to the priors,  favored to be related to radiation changes.  Visualized thyroid is unremarkable.  Lungs/Pleura: Moderate centrilobular and paraseptal emphysematous changes, upper lung predominant.  Very mild linear scarring in the left upper lobe/lingula.  No suspicious pulmonary nodules.  No focal consolidation.  No pleural effusion or pneumothorax.  Upper Abdomen: Visualized upper abdomen is grossly unremarkable.  Musculoskeletal: Lower thoracic/upper lumbar dextroscoliosis.  IMPRESSION: No evidence of acute cardiopulmonary disease.  No findings suspicious for recurrent or metastatic disease.  Stable soft tissue along the upper esophagus, favoring radiation changes.  Aortic Atherosclerosis (ICD10-I70.0) and Emphysema (ICD10-J43.9).  ASSESSMENT & PLAN:  Assessment/Plan:  A 66 y.o. male with small cell lung cancer.  In clinic today, I went over his chest CT images with him, for which he could see there remains no evidence of disease recurrence.  Clinically, this gentleman continues to do very well.  As mentioned previously, he is scheduled for another brain MRI in early November 2023 to keep a close eye on a suspicious right parietal lesion.  I will defer to neurooncology as to how they wish to address the lesion in question.  As there are no other sites of disease outside of his brain, he will continue to be followed conservatively.  I will see him back in another 4 months, with a chest CT being done a day before his next visit for his continued radiographic lung cancer surveillance.  The patient and his wife understand all the plans discussed today and are in agreement with them.    Karleigh Bunte Macarthur Critchley, MD

## 2022-07-15 ENCOUNTER — Inpatient Hospital Stay (INDEPENDENT_AMBULATORY_CARE_PROVIDER_SITE_OTHER): Payer: Medicare HMO | Admitting: Oncology

## 2022-07-15 ENCOUNTER — Other Ambulatory Visit: Payer: Self-pay | Admitting: Oncology

## 2022-07-15 VITALS — BP 167/108 | HR 92 | Temp 97.9°F | Resp 14 | Ht 67.0 in | Wt 147.6 lb

## 2022-07-15 DIAGNOSIS — C3490 Malignant neoplasm of unspecified part of unspecified bronchus or lung: Secondary | ICD-10-CM | POA: Diagnosis not present

## 2022-07-22 ENCOUNTER — Other Ambulatory Visit: Payer: Self-pay | Admitting: Oncology

## 2022-07-22 MED ORDER — BENZONATATE 100 MG PO CAPS: 100.0000 mg | ORAL_CAPSULE | Freq: Three times a day (TID) | ORAL | 1 refills | Status: DC | PRN

## 2022-07-24 DIAGNOSIS — Z7952 Long term (current) use of systemic steroids: Secondary | ICD-10-CM | POA: Diagnosis not present

## 2022-07-24 DIAGNOSIS — R053 Chronic cough: Secondary | ICD-10-CM | POA: Diagnosis not present

## 2022-07-24 DIAGNOSIS — R0600 Dyspnea, unspecified: Secondary | ICD-10-CM | POA: Diagnosis not present

## 2022-07-24 DIAGNOSIS — R058 Other specified cough: Secondary | ICD-10-CM | POA: Diagnosis not present

## 2022-07-24 DIAGNOSIS — R0989 Other specified symptoms and signs involving the circulatory and respiratory systems: Secondary | ICD-10-CM | POA: Diagnosis not present

## 2022-07-24 DIAGNOSIS — Z85118 Personal history of other malignant neoplasm of bronchus and lung: Secondary | ICD-10-CM | POA: Diagnosis not present

## 2022-07-24 DIAGNOSIS — Z923 Personal history of irradiation: Secondary | ICD-10-CM | POA: Diagnosis not present

## 2022-07-24 DIAGNOSIS — Z79899 Other long term (current) drug therapy: Secondary | ICD-10-CM | POA: Diagnosis not present

## 2022-07-24 DIAGNOSIS — M419 Scoliosis, unspecified: Secondary | ICD-10-CM | POA: Diagnosis not present

## 2022-07-24 DIAGNOSIS — R918 Other nonspecific abnormal finding of lung field: Secondary | ICD-10-CM | POA: Diagnosis not present

## 2022-07-24 DIAGNOSIS — F17210 Nicotine dependence, cigarettes, uncomplicated: Secondary | ICD-10-CM | POA: Diagnosis not present

## 2022-07-30 ENCOUNTER — Encounter: Payer: Self-pay | Admitting: Oncology

## 2022-07-31 ENCOUNTER — Ambulatory Visit: Payer: Medicare HMO | Admitting: Internal Medicine

## 2022-07-31 ENCOUNTER — Ambulatory Visit
Admission: RE | Admit: 2022-07-31 | Discharge: 2022-07-31 | Disposition: A | Discharge status: Home / Self Care | Payer: Medicare HMO | Source: Ambulatory Visit | Attending: Internal Medicine | Admitting: Internal Medicine

## 2022-07-31 DIAGNOSIS — C7931 Secondary malignant neoplasm of brain: Secondary | ICD-10-CM

## 2022-07-31 DIAGNOSIS — G939 Disorder of brain, unspecified: Secondary | ICD-10-CM | POA: Diagnosis not present

## 2022-07-31 MED ORDER — SODIUM CHLORIDE 0.9% FLUSH
10.0000 mL | INTRAVENOUS | Status: DC | PRN
  Administered 2022-07-31: 10 mL via INTRAVENOUS

## 2022-07-31 MED ORDER — HEPARIN SOD (PORK) LOCK FLUSH 100 UNIT/ML IV SOLN
500.0000 [IU] | Freq: Once | INTRAVENOUS | Status: AC
  Administered 2022-07-31: 500 [IU] via INTRAVENOUS

## 2022-07-31 MED ORDER — GADOPICLENOL 0.5 MMOL/ML IV SOLN
7.0000 mL | Freq: Once | INTRAVENOUS | Status: AC | PRN
  Administered 2022-07-31: 7 mL via INTRAVENOUS

## 2022-08-04 ENCOUNTER — Other Ambulatory Visit: Payer: Self-pay

## 2022-08-04 ENCOUNTER — Other Ambulatory Visit: Payer: Self-pay | Admitting: Radiation Therapy

## 2022-08-04 ENCOUNTER — Inpatient Hospital Stay: Payer: Medicare HMO | Attending: Oncology | Admitting: Internal Medicine

## 2022-08-04 VITALS — BP 166/119 | HR 115 | Temp 97.8°F | Resp 17 | Wt 148.9 lb

## 2022-08-04 DIAGNOSIS — F17210 Nicotine dependence, cigarettes, uncomplicated: Secondary | ICD-10-CM | POA: Insufficient documentation

## 2022-08-04 DIAGNOSIS — Z79899 Other long term (current) drug therapy: Secondary | ICD-10-CM | POA: Diagnosis not present

## 2022-08-04 DIAGNOSIS — C3490 Malignant neoplasm of unspecified part of unspecified bronchus or lung: Secondary | ICD-10-CM | POA: Insufficient documentation

## 2022-08-04 DIAGNOSIS — C7931 Secondary malignant neoplasm of brain: Secondary | ICD-10-CM | POA: Diagnosis not present

## 2022-08-04 DIAGNOSIS — Z7952 Long term (current) use of systemic steroids: Secondary | ICD-10-CM | POA: Insufficient documentation

## 2022-08-04 NOTE — Progress Notes (Signed)
[facility] [place], [place] [address] [phone]   Interval Evaluation  Date of Service: 08/04/22 Patient Name: Spencer Butler Patient MRN: [id number] Patient DOB: 1955/10/30 Provider: Ventura Sellers, MD  Identifying Statement:  Spencer Butler is a 66 y.o. 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 Zada Finders 01/24/22: Salvage SRS x4 Tammi Klippel)  Interval History: Spencer Butler presents today for follow up after recent MRI brain.  He remarks overall stability with regards to weakness, gait since dropping decadron to 28m daily.  Walking remains independent.  Fatigue continues to be a problem, as is short term memory.  Does described chronic dry cough over the past ~1 month.  H+P (02/11/22) Patient presents today for [place]  He reports significant improvement in his right sided weakness following cyst aspiration, ommaya placement.  He does walk a little on his own, but uses a walker or wheelchair for longer outings.  He continues to work with PT.  Takes care of most needs independently at home.  Did have one seizure earlier this month, right leg shaking.  No  recurrence since dosing the Keppra 504mBID, but he complains of fatigue and irritability with the medication.  Medications: Current Outpatient Medications on File Prior to Visit  Medication Sig Dispense Refill   benzonatate (TESSALON) 100 MG capsule Take 1 capsule (100 mg total) by mouth 3 (three) times daily as needed for cough. 30 capsule 1   dexamethasone (DECADRON) 1 MG tablet Take 2 tablets (2 mg total) by mouth daily with breakfast. 90 tablet 1   dronabinol (MARINOL) 2.5 MG capsule Take 2.5 mg by mouth 2 (two) times daily.     lamoTRIgine (LAMICTAL) 25 MG tablet TAKE 2 TABLETS(50 MG) BY MOUTH DAILY 60 tablet 3   megestrol (MEGACE) 40 MG tablet Take 40 mg by mouth daily.     ondansetron (ZOFRAN) 4 MG tablet Take 1 tablet (4 mg total) by mouth every 4 (four) hours as needed for nausea. 90 tablet 3   pantoprazole (PROTONIX) 20 MG tablet Take 1 tablet (20 mg total) by mouth daily. 30 tablet 3   No current facility-administered medications on file prior to visit.    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[place]   Malignant neoplasm of mediastinum, part unspecified ([facility]   Malignant neoplasm of mediastinum, part unspecified ([facility]   Past Surgical History:  Past Surgical History:  Procedure Laterality Date   APPLICATION OF CRANIAL NAVIGATION N/A 01/08/2022   Procedure: APPLICATION OF CRANIAL NAVIGATION;  Surgeon: OsJudith PartMD;  [place] Service:  Neurosurgery;  Laterality: N/A;   BURR HOLE Left 01/08/2022   Procedure: Left Ommaya placement with brainlab placement ventricular catheter;  Surgeon: Judith Part, MD;  [facility];  Service: Neurosurgery;  Laterality: Left;   Social History:  Social History   Socioeconomic History   Marital status: Married    Spouse name: Not on file   Number of children: Not on file   Years of education: Not on file    Highest education level: Not on file  Occupational History   Not on file  Tobacco Use   Smoking status: Every Day    Packs/day: 0.50    Types: Cigarettes   Smokeless tobacco: Never  Vaping Use   Vaping Use: Never used  Substance and Sexual Activity   Alcohol use: Never   Drug use: Never   Sexual activity: Not on file  Other Topics Concern   Not on file  Social History Narrative   Not on file   Social Determinants of Health   Financial Resource Strain: Not on file  Food Insecurity: Not on file  Transportation Needs: Not on file  Physical Activity: Not on file  Stress: Not on file  Social Connections: Not on file  Intimate Partner Violence: Not on file   Family History: No family history on file.  Review of Systems: Constitutional: Doesn't report fevers, chills or abnormal weight loss Eyes: Doesn't report blurriness of vision Ears, nose, mouth, throat, and face: Doesn't report sore throat Respiratory: Doesn't report cough, dyspnea or wheezes Cardiovascular: Doesn't report palpitation, chest discomfort  Gastrointestinal:  Doesn't report nausea, constipation, diarrhea GU: Doesn't report incontinence Skin: Doesn't report skin rashes Neurological: Per HPI Musculoskeletal: Doesn't report joint pain Behavioral/Psych: Doesn't report anxiety  Physical Exam: Vitals:   08/04/22 0944  BP: (!) 166/119  Pulse: (!) 115  Resp: 17  Temp: 97.8 F (36.6 C)  SpO2: 98%   KPS: 80. General: Alert, cooperative, pleasant, in no acute distress Head: Normal EENT: No conjunctival injection or scleral icterus.  Lungs: Resp effort normal Cardiac: Regular rate Abdomen: Non-distended abdomen Skin: No rashes cyanosis or petechiae. Extremities: No clubbing or edema  Neurologic Exam: Mental Status: Awake, alert, attentive to examiner. Oriented to self and environment. Language is fluent with intact comprehension. Psychomotor slowing. Cranial Nerves: Visual acuity is grossly normal.  Visual fields are full. Extra-ocular movements intact. No ptosis. Face is symmetric Motor: Tone and bulk are normal. Power is 5/5 in right arm and leg. Reflexes are symmetric, no pathologic reflexes present.  Sensory: Intact to light touch Gait: Mild dystaxia   Labs: I have reviewed the data as listed    Component Value Date/Time   NA 135 (A) 07/14/2022 0000   K 4.2 07/14/2022 0000   CL 109 (A) 07/14/2022 0000   CO2 17 07/14/2022 0000   GLUCOSE 84 08/06/2020 1129   BUN 18 07/14/2022 0000   CREATININE 1.0 07/14/2022 0000   CREATININE 0.71 01/08/2022 1226   CREATININE 0.90 08/06/2020 1129   CALCIUM 9.5 07/14/2022 0000   PROT 6.7 08/06/2020 1129   ALBUMIN 4.1 07/14/2022 0000   AST 30 07/14/2022 0000   AST 14 (L) 08/06/2020 1129   ALT 35 07/14/2022 0000   ALT 9 08/06/2020 1129   ALKPHOS 75 07/14/2022 0000   BILITOT 0.6 08/06/2020 1129   GFRNONAA >60 01/08/2022 1226   GFRNONAA >60 08/06/2020 1129   Lab Results  Component Value Date   WBC 12.6 07/14/2022   NEUTROABS 8.82 07/14/2022  HGB 15.9 07/14/2022   HCT 49 07/14/2022   MCV 89.7 01/08/2022   PLT 210 07/14/2022   Imaging:  [place] Clinician Interpretation: I have personally reviewed the CNS images as listed.  My interpretation, in the context of the patient's clinical presentation, is stable disease  MR BRAIN W WO CONTRAST  Result Date: 08/03/2022 CLINICAL DATA:  Metastatic lung cancer EXAM: MRI HEAD WITHOUT AND WITH CONTRAST TECHNIQUE: Multiplanar, multiecho pulse sequences of the brain and surrounding structures were obtained without and with intravenous contrast. CONTRAST:  7 cc of vueway intravenous COMPARISON:  06/27/2022 FINDINGS: BRAIN New Lesions: None. Larger lesions: None. Stable or Smaller lesions: The largest of the patient's lesions, in the right periatrial white matter, has unchanged necrotic type enhancement and 2.6 cm maximal span. Associated edema on a background of confluent FLAIR hyperintensity in the white  matter, presumably from whole-brain radiotherapy. Subcentimeter areas of enhancement in the right occipital cortex and right frontal white matter are unchanged on 13:94 and 13:105. A few subtle, punctate areas are not clearly metastatic foci but are stable none the less. High left cerebral cyst with traversing catheter, no worrisome superimposed enhancement. Other Brain findings: No infarct, hydrocephalus, or collection. Vascular: Major flow voids and vascular enhancements are preserved Skull and upper cervical spine: Unremarkable Sinuses/Orbits: Unremarkable IMPRESSION: Stable brain lesions including the 2.6 cm right periatrial lesion that was progressing on recent priors. No new metastatic disease. Electronically Signed   By: Jorje Guild M.D.   On: 08/03/2022 11:58      Assessment/Plan Metastasis to brain ([place])  Spencer Butler is clinically stable today.  Brain MRI demonstrates stable findings, which are increasingly suggestive of radiation necrosis.  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  We ask that VWendall Isabellreturn to clinic in 3 months following next brain MRI, or sooner as needed.  We appreciate the opportunity to participate in the care of VRosann Butler   All questions were answered. The patient knows to call the clinic with any problems, questions or concerns. No barriers to learning were detected.  The total time spent in the encounter was 30 minutes and more than 50% was on counseling and review of test results   ZVentura Sellers MD Medical Director of Neuro-Oncology [facility]11/06/23 9:15 AM

## 2022-08-05 ENCOUNTER — Telehealth: Payer: Self-pay | Admitting: Internal Medicine

## 2022-08-05 NOTE — Telephone Encounter (Signed)
Per 11/6 los called and spoke to pt about appointment

## 2022-08-15 ENCOUNTER — Telehealth: Payer: Self-pay

## 2022-08-15 NOTE — Telephone Encounter (Signed)
Patient's wife called to report that patient is having severe headaches since tapering off decadron. She reports his cough "is manageable." No other symptoms reported.

## 2022-08-26 ENCOUNTER — Ambulatory Visit: Payer: Medicare HMO | Admitting: Podiatry

## 2022-10-09 ENCOUNTER — Other Ambulatory Visit: Payer: Self-pay | Admitting: Internal Medicine

## 2022-10-09 ENCOUNTER — Other Ambulatory Visit: Payer: Self-pay | Admitting: Radiation Therapy

## 2022-10-09 DIAGNOSIS — C7931 Secondary malignant neoplasm of brain: Secondary | ICD-10-CM

## 2022-10-09 NOTE — Progress Notes (Signed)
Orders entered for port access the day of brain MRI at [facility].

## 2022-10-17 ENCOUNTER — Other Ambulatory Visit: Payer: Self-pay | Admitting: Internal Medicine

## 2022-10-29 ENCOUNTER — Telehealth: Payer: Self-pay | Admitting: Oncology

## 2022-10-29 NOTE — Telephone Encounter (Signed)
Contacted pt to notify him of upcoming CT Chest appt. Unable to reach via phone, voicemail was left requesting he call the office back for appt information.       Scheduled for 11/17/22; Checking in @ 10 am

## 2022-11-13 DIAGNOSIS — C7931 Secondary malignant neoplasm of brain: Secondary | ICD-10-CM | POA: Diagnosis not present

## 2022-11-13 DIAGNOSIS — J9811 Atelectasis: Secondary | ICD-10-CM | POA: Diagnosis not present

## 2022-11-13 DIAGNOSIS — J439 Emphysema, unspecified: Secondary | ICD-10-CM | POA: Diagnosis not present

## 2022-11-13 DIAGNOSIS — C3490 Malignant neoplasm of unspecified part of unspecified bronchus or lung: Secondary | ICD-10-CM | POA: Diagnosis not present

## 2022-11-13 LAB — CBC AND DIFFERENTIAL
HCT: 48 (ref 41–53)
Hemoglobin: 15.4 (ref 13.5–17.5)
Neutrophils Absolute: 9.07
Platelets: 230 10*3/uL (ref 150–400)
WBC: 11.2

## 2022-11-13 LAB — BASIC METABOLIC PANEL
BUN: 19 (ref 4–21)
CO2: 19 (ref 13–22)
Chloride: 108 (ref 99–108)
Creatinine: 0.9 (ref 0.6–1.3)
Glucose: 90
Potassium: 4.1 mEq/L (ref 3.5–5.1)
Sodium: 137 (ref 137–147)

## 2022-11-13 LAB — COMPREHENSIVE METABOLIC PANEL
Albumin: 4.4 (ref 3.5–5.0)
Calcium: 9.3 (ref 8.7–10.7)

## 2022-11-13 LAB — HEPATIC FUNCTION PANEL
ALT: 26 U/L (ref 10–40)
AST: 25 (ref 14–40)
Alkaline Phosphatase: 72 (ref 25–125)
Bilirubin, Total: 0.7

## 2022-11-13 LAB — CBC: RBC: 5.24 — AB (ref 3.87–5.11)

## 2022-11-13 NOTE — Progress Notes (Signed)
[facility]  [address] [place],  [place]  [address] [phone]  Clinic Day:  11/14/2022  Referring physician: Nicoletta Dress, MD  HISTORY OF PRESENT ILLNESS:  The patient is a 67 y.o. 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  He comes in today to go over his recent chest CT to ensure there remains no radiographic evidence of disease recurrence.  Since his last visit, the patient has been doing fairly well.  He does still get mildly short of breath with significant exertion.  However, he denies having hemoptysis, coughing, or other respiratory symptoms which concern him for overt signs of disease recurrence.  Of note, he is scheduled to see neuro-oncology next week; a brain MRI is also scheduled for next week for his continued metastatic CNS surveillance.  With respect to his small cell lung cancer treatment history, he completed definitive chemoradiation, which consisted of 4 cycles of cisplatin/etoposide in October 2021.  Due to growth of a left large, cystic parietal lesion, he underwent whole brain radiation in January 2023.  After this lesion grew in April 2023, it was surgically drained, with an Ommaya reservoir placed in this location.  He received 4 doses of salvage stereotactic radiation in April 2023.  Interestingly enough, the fluid from this cystic mass did not show any malignant cells.  PHYSICAL EXAM:  Blood pressure (!) 152/114, pulse (!) 110, temperature 98.7 F (37.1 C), resp. rate 16, height 5\' 7"  (1.702 m), weight 157 lb 4.8 oz (71.4 kg), SpO2 100 %. Wt Readings from Last 3 Encounters:  11/14/22 157 lb 4.8 oz (71.4 kg)  08/04/22 148 lb 14.4 oz (67.5 kg)  07/15/22 147 lb 9.6 oz (67 kg)   Body mass index is 24.64 kg/m. Performance status (ECOG):  0 Physical Exam Constitutional:      General: He is not in acute distress.    Appearance: He is not ill-appearing.     Comments:  He looks stronger and has gained weight vs previous visits.  He walks in today on his own power.  He does have a chronically ill appearance with a moon facies.   HENT:     Head: Normocephalic.     Nose: Nose normal.     Mouth/Throat:     Mouth: Mucous membranes are moist.     Pharynx: Oropharynx is clear.  Cardiovascular:     Rate and Rhythm: Regular rhythm. Tachycardia present.     Heart sounds: No murmur heard.    No friction rub. No gallop.  Pulmonary:     Effort: Pulmonary effort is normal.     Breath sounds: Normal breath sounds.  Abdominal:     General: Bowel sounds are normal. There is no distension.     Palpations: Abdomen is soft.     Tenderness: There is no abdominal tenderness.  Musculoskeletal:     Cervical back: No rigidity.     Right lower leg: No edema.     Left lower leg: No edema.  Skin:    General: Skin is warm and dry.     Findings: No bruising, erythema or rash.  Neurological:     General: No focal deficit present.     Mental Status: He is oriented to person, place, and time.   SCANS:  His chest CT done yesterday revealed the following: FINDINGS: Cardiovascular: The heart is nonenlarged. No pericardial effusion. Scattered atherosclerotic changes along the thoracic aorta.  Coronary artery calcifications are seen. Slight ectasia of the distal aortic arch with transverse dimension 3.6 cm. Unchanged from prior. Right upper chest port.  Mediastinum/Nodes: No specific abnormal lymph node enlargement identified in the axillary region, hilum or mediastinum. There is specific wall thickening along the course of the mid to upper esophagus with the adjacent stranding and edema. This was seen previous. This could be sequela radiation change. Please correlate for any clinical evidence of esophagitis.  Lungs/Pleura: Centrilobular emphysematous lung changes are identified. There is apical pleural thickening and scarring. There is some basilar scar atelectasis. There  is a calcified nodule at the right lung base on series 301, image 76 consistent with old granulomatous disease, unchanged from prior no new dominant nodule. No effusion or pneumothorax.  Upper Abdomen: Along the upper abdomen the adrenal glands are preserved. Fatty liver infiltration.  Musculoskeletal: Curvature of the spine. Scattered degenerative changes. There is a subcutaneous rounded nodule in the upper central back on image 53 of series 2, unchanged from previous. Possible sebaceous cysts.  IMPRESSION: Stable CT scan.  No developing new mass lesion, fluid collection or lymph node enlargement.  Persistent wall thickening and adjacent stranding along the mid to upper thoracic esophagus. Again this could be posttreatment related such as radiation. Please correlate for any clinical evidence of esophagitis.  Aortic Atherosclerosis (ICD10-I70.0) and Emphysema (ICD10-J43.9).   ASSESSMENT & PLAN:  Assessment/Plan:  A 67 y.o. male with small cell lung cancer.  In clinic today, I went over his chest CT images with him, for which he could see there remains no evidence of disease recurrence.  Clinically, this gentleman continues to do very well.  As mentioned previously, he is scheduled for another brain MRI next week. I will defer to neurooncology as to how they wish to address his CNS disease..  As there are no other sites of disease outside of his brain, he will continue to be followed conservatively.  I will see him back in another 4 months, with a chest x-ray being done on the day of his next visit for his continued radiographic lung cancer surveillance.  The patient and his wife understand all the plans discussed today and are in agreement with them.    Phoenyx Paulsen Macarthur Critchley, MD

## 2022-11-14 ENCOUNTER — Inpatient Hospital Stay: Payer: Medicare HMO | Attending: Oncology | Admitting: Oncology

## 2022-11-14 ENCOUNTER — Other Ambulatory Visit: Payer: Self-pay | Admitting: Oncology

## 2022-11-14 VITALS — BP 152/114 | HR 110 | Temp 98.7°F | Resp 16 | Ht 67.0 in | Wt 157.3 lb

## 2022-11-14 DIAGNOSIS — C3490 Malignant neoplasm of unspecified part of unspecified bronchus or lung: Secondary | ICD-10-CM

## 2022-11-14 DIAGNOSIS — F17210 Nicotine dependence, cigarettes, uncomplicated: Secondary | ICD-10-CM | POA: Insufficient documentation

## 2022-11-14 DIAGNOSIS — C7931 Secondary malignant neoplasm of brain: Secondary | ICD-10-CM | POA: Insufficient documentation

## 2022-11-18 ENCOUNTER — Ambulatory Visit
Admission: RE | Admit: 2022-11-18 | Discharge: 2022-11-18 | Disposition: A | Discharge status: Home / Self Care | Payer: Medicare HMO | Source: Ambulatory Visit | Attending: Internal Medicine | Admitting: Internal Medicine

## 2022-11-18 DIAGNOSIS — C7931 Secondary malignant neoplasm of brain: Secondary | ICD-10-CM

## 2022-11-18 DIAGNOSIS — C729 Malignant neoplasm of central nervous system, unspecified: Secondary | ICD-10-CM | POA: Diagnosis not present

## 2022-11-18 MED ORDER — GADOPICLENOL 0.5 MMOL/ML IV SOLN
7.5000 mL | Freq: Once | INTRAVENOUS | Status: AC | PRN
  Administered 2022-11-18: 7.5 mL via INTRAVENOUS

## 2022-11-18 MED ORDER — SODIUM CHLORIDE 0.9% FLUSH
10.0000 mL | INTRAVENOUS | Status: DC | PRN
  Administered 2022-11-18: 10 mL via INTRAVENOUS

## 2022-11-18 MED ORDER — HEPARIN SOD (PORK) LOCK FLUSH 100 UNIT/ML IV SOLN
500.0000 [IU] | Freq: Once | INTRAVENOUS | Status: AC
  Administered 2022-11-18: 500 [IU] via INTRAVENOUS

## 2022-11-19 ENCOUNTER — Encounter: Payer: Self-pay | Admitting: Oncology

## 2022-11-20 ENCOUNTER — Inpatient Hospital Stay (HOSPITAL_BASED_OUTPATIENT_CLINIC_OR_DEPARTMENT_OTHER): Payer: Medicare HMO | Admitting: Internal Medicine

## 2022-11-20 ENCOUNTER — Other Ambulatory Visit: Payer: Self-pay

## 2022-11-20 VITALS — BP 162/103 | HR 88 | Temp 97.3°F | Resp 20 | Wt 158.4 lb

## 2022-11-20 DIAGNOSIS — C7931 Secondary malignant neoplasm of brain: Secondary | ICD-10-CM | POA: Diagnosis not present

## 2022-11-20 DIAGNOSIS — R569 Unspecified convulsions: Secondary | ICD-10-CM

## 2022-11-20 DIAGNOSIS — F17210 Nicotine dependence, cigarettes, uncomplicated: Secondary | ICD-10-CM | POA: Diagnosis not present

## 2022-11-20 DIAGNOSIS — C3490 Malignant neoplasm of unspecified part of unspecified bronchus or lung: Secondary | ICD-10-CM | POA: Diagnosis not present

## 2022-11-20 MED ORDER — LIDOCAINE-PRILOCAINE 2.5-2.5 % EX CREA: 1.0000 | TOPICAL_CREAM | CUTANEOUS | 0 refills | Status: DC | PRN

## 2022-11-20 NOTE — Progress Notes (Signed)
[facility] [place], [place] [address] [phone]   Interval Evaluation  Date of Service: 11/20/22 Patient Name: Spencer Butler Patient MRN: [id number] Patient DOB: 11/19/1955 Provider: Ventura Sellers, MD  Identifying Statement:  Spencer Butler is a 67 y.o. male with No diagnosis found.   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 Zada Finders 01/24/22: Salvage SRS x4 Tammi Klippel)  Interval History: Spencer Butler presents today for follow up after recent MRI brain.  He feels improved overall, fatigue is better.  Walking remains independent.  He feels memory and cognition are a little sharper as well.  No longer dosing decadron.  H+P (02/11/22) Patient presents today for follow up after recent [place] with Dr. Tammi Klippel.  He reports significant improvement in his right sided weakness following cyst aspiration, ommaya placement.  He does walk a little on his own, but uses a walker or wheelchair for longer outings.  He continues to work with PT.  Takes care of most needs independently at home.  Did have one seizure earlier this month, right leg shaking.  No recurrence since dosing the Keppra 500mg  BID, but he complains of fatigue and irritability with  the medication.  Medications: Current Outpatient Medications on File Prior to Visit  Medication Sig Dispense Refill   benzonatate (TESSALON) 100 MG capsule Take 1 capsule (100 mg total) by mouth 3 (three) times daily as needed for cough. 30 capsule 1   dexamethasone (DECADRON) 1 MG tablet Take 1 tablet (1 mg total) by mouth daily with breakfast. 60 tablet 1   dronabinol (MARINOL) 2.5 MG capsule Take 2.5 mg by mouth 2 (two) times daily.     lamoTRIgine (LAMICTAL) 25 MG tablet TAKE 2 TABLETS(50 MG) BY MOUTH DAILY 60 tablet 3   megestrol (MEGACE) 40 MG tablet Take 40 mg by mouth daily.     ondansetron (ZOFRAN) 4 MG tablet Take 1 tablet (4 mg total) by mouth every 4 (four) hours as needed for nausea. 90 tablet 3   pantoprazole (PROTONIX) 20 MG tablet TAKE 1 TABLET(20 MG) BY MOUTH DAILY 30 tablet 3   No current facility-administered medications on file prior to visit.    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[place])    Past Surgical History:  Past Surgical History:  Procedure Laterality Date   APPLICATION OF CRANIAL NAVIGATION N/A 01/08/2022   Procedure: APPLICATION OF CRANIAL NAVIGATION;  Surgeon: Judith Part, MD;  Location: [facility];  Service: Neurosurgery;  Laterality: N/A;   BURR HOLE Left 01/08/2022   Procedure: Left Ommaya placement with  brainlab placement ventricular catheter;  Surgeon: Judith Part, MD;  Location: [facility];  Service: Neurosurgery;  Laterality: Left;   Social History:  Social History   Socioeconomic History   Marital status: Married    Spouse name: Not on file   Number of children: Not on file   Years of education: Not on file   Highest education level: Not on file  Occupational History   Not on file  Tobacco Use   Smoking  status: Every Day    Packs/day: 0.50    Types: Cigarettes   Smokeless tobacco: Never  Vaping Use   Vaping Use: Never used  Substance and Sexual Activity   Alcohol use: Never   Drug use: Never   Sexual activity: Not on file  Other Topics Concern   Not on file  Social History Narrative   Not on file   Social Determinants of Health   Financial Resource Strain: Not on file  Food Insecurity: Not on file  Transportation Needs: Not on file  Physical Activity: Not on file  Stress: Not on file  Social Connections: Not on file  Intimate Partner Violence: Not on file   Family History: No family history on file.  Review of Systems: Constitutional: Doesn't report fevers, chills or abnormal weight loss Eyes: Doesn't report blurriness of vision Ears, nose, mouth, throat, and face: Doesn't report sore throat Respiratory: Doesn't report cough, dyspnea or wheezes Cardiovascular: Doesn't report palpitation, chest discomfort  Gastrointestinal:  Doesn't report nausea, constipation, diarrhea GU: Doesn't report incontinence Skin: Doesn't report skin rashes Neurological: Per HPI Musculoskeletal: Doesn't report joint pain Behavioral/Psych: Doesn't report anxiety  Physical Exam: There were no vitals filed for this visit.  KPS: 80. General: Alert, cooperative, pleasant, in no acute distress Head: Normal EENT: No conjunctival injection or scleral icterus.  Lungs: Resp effort normal Cardiac: Regular rate Abdomen: Non-distended abdomen Skin: No rashes cyanosis or petechiae. Extremities: No clubbing or edema  Neurologic Exam: Mental Status: Awake, alert, attentive to examiner. Oriented to self and environment. Language is fluent with intact comprehension. Psychomotor slowing. Cranial Nerves: Visual acuity is grossly normal. Visual fields are full. Extra-ocular movements intact. No ptosis. Face is symmetric Motor: Tone and bulk are normal. Power is 5/5 in right arm and leg. Reflexes are  symmetric, no pathologic reflexes present.  Sensory: Intact to light touch Gait: Mild dystaxia   Labs: I have reviewed the data as listed    Component Value Date/Time   NA 137 11/13/2022 0000   K 4.1 11/13/2022 0000   CL 108 11/13/2022 0000   CO2 19 11/13/2022 0000   GLUCOSE 84 08/06/2020 1129   BUN 19 11/13/2022 0000   CREATININE 0.9 11/13/2022 0000   CREATININE 0.71 01/08/2022 1226   CREATININE 0.90 08/06/2020 1129   CALCIUM 9.3 11/13/2022 0000   PROT 6.7 08/06/2020 1129   ALBUMIN 4.4 11/13/2022 0000   AST 25 11/13/2022 0000   AST 14 (L) 08/06/2020 1129   ALT 26 11/13/2022 0000   ALT 9 08/06/2020 1129   ALKPHOS 72 11/13/2022 0000   BILITOT 0.6 08/06/2020 1129   GFRNONAA >60 01/08/2022 1226   GFRNONAA >60 08/06/2020 1129   Lab Results  Component Value Date   WBC 11.2 11/13/2022   NEUTROABS 9.07 11/13/2022   HGB 15.4 11/13/2022   HCT 48 11/13/2022   MCV 89.7 01/08/2022   PLT 230 11/13/2022   Imaging:  [facility] Clinician Interpretation: I have personally reviewed the CNS images as listed.  My interpretation,  in the context of the patient's clinical presentation, is progressive disease  MR BRAIN W WO CONTRAST  Result Date: 11/20/2022 CLINICAL DATA:  CNS neoplasm, assess treatment response. Follow-up brain metastases EXAM: MRI HEAD WITHOUT AND WITH CONTRAST TECHNIQUE: Multiplanar, multiecho pulse sequences of the brain and surrounding structures were obtained without and with intravenous contrast. CONTRAST:  7.5 cc of UA intravenous COMPARISON:  07/31/2022 FINDINGS: Brain: No new lesion is seen. Dominant heterogeneously enhancing/necrotic appearing right periatrial mass has enlarged to 3 cm as compared to 2.5 cm on prior, with further spreading along the subependymal atrium of the right lateral ventricle. A pericystic nodule along the high left frontal parietal cortex has increased by 2-3 mm, now up to 7 mm in length on 15:16. Subcentimeter lesions remain unchanged in the right  occipital lobe (13:92), Right frontal white matter (image 122), left frontal parietal cortex (image 123). No progression of the left frontal parietal cyst which is cannulated by Ommaya catheter. Confluent FLAIR hyperintensity in the cerebral white matter and pons, likely treatment related. Mild superimposed swelling in the right parietal region attributed to edema, stable. Vascular: Major flow voids and vascular enhancements are preserved Skull and upper cervical spine: Normal marrow signal. Sinuses/Orbits: No focal marrow lesion. IMPRESSION: 1. Mild enlargement of the dominant enhancing mass in the right periatrial white matter, up to 3 cm with mild edema superimposed on the confluent white matter disease. 2. 3 mm increase of the pericystic nodule at the left frontal parietal vertex, measuring 7 mm today. 3. No new lesion. Electronically Signed   By: Jorje Guild M.D.   On: 11/20/2022 09:38      Assessment/Plan No diagnosis found.  Spencer Butler is clinically stable today.  Brain MRI demonstrates further slow progression of right periventricular treated metastasis.  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  Not likely a candidate for LITT because of adjacency to ventricle.  He will think it over at home, we will also discuss the case in detail in CNS tumor board next week.    We will not resume decadron because of lack of symptoms.  Remains on observation with Dr. Bobby Rumpf for small cell.  We ask that Spencer Butler return to clinic next week following tumor board discussion.  We appreciate the opportunity to participate in the care of Spencer Butler.   All  questions were answered. The patient knows to call the clinic with any problems, questions or concerns. No barriers to learning were detected.  The total time spent in the encounter was 40 minutes and more than 50% was on counseling and review of test results   Ventura Sellers, MD Medical Director of Neuro-Oncology [facility] at [place] 11/20/22 10:49 AM

## 2022-11-24 ENCOUNTER — Inpatient Hospital Stay: Payer: Medicare HMO

## 2022-11-24 ENCOUNTER — Inpatient Hospital Stay (HOSPITAL_BASED_OUTPATIENT_CLINIC_OR_DEPARTMENT_OTHER): Payer: Medicare HMO | Admitting: Internal Medicine

## 2022-11-24 DIAGNOSIS — C7931 Secondary malignant neoplasm of brain: Secondary | ICD-10-CM

## 2022-11-24 NOTE — Progress Notes (Signed)
I connected with Spencer Butler on 11/24/22 at 11:00 AM EST by telephone visit and verified that I am speaking with the correct person using two identifiers.  I discussed the limitations, risks, security and privacy concerns of performing an evaluation and management service by telemedicine and the availability of in-person appointments. I also discussed with the patient that there may be a patient responsible charge related to this service. The patient expressed understanding and agreed to proceed.  Other persons participating in the visit and their role in the encounter:  n/a   Patient's location:  Home Provider's location:  Office Chief Complaint:  Metastasis to brain [facility])  History of Present Ilness: Spencer Butler reports no clinical changes today.  He continues to walk independently, no new or progress deficits. His case was reviewed in detail at CNS tumor board this morning.    Observations: Language and cognition at baseline  Assessment and Plan: Metastasis to brain [facility])  Case was reviewed in tumor board.  We recommended pursuing advanced imaging with FDG-PET, for better characterization of progressive tumor vs radiation necrosis.    Spencer Butler was agreeable with this plan.    Follow Up Instructions: RTC after testing, as discussed  I discussed the assessment and treatment plan with the patient.  The patient was provided an opportunity to ask questions and all were answered.  The patient agreed with the plan and demonstrated understanding of the instructions.    The patient was advised to call back or seek an in-person evaluation if the symptoms worsen or if the condition fails to improve as anticipated.    Ventura Sellers, MD   I provided 15 minutes of non face-to-face telephone visit time during this encounter, and > 50% was spent counseling as documented under my assessment & plan.

## 2022-11-26 ENCOUNTER — Other Ambulatory Visit: Payer: Self-pay | Admitting: Radiation Therapy

## 2022-11-27 ENCOUNTER — Telehealth: Payer: Self-pay | Admitting: Internal Medicine

## 2022-11-27 NOTE — Telephone Encounter (Signed)
Per 2/29 IB reached out to patient to schedule appointment, patient aware of date and time of appointment.

## 2022-12-11 ENCOUNTER — Encounter (HOSPITAL_COMMUNITY)
Admission: RE | Admit: 2022-12-11 | Discharge: 2022-12-11 | Disposition: A | Discharge status: Home / Self Care | Payer: Medicare HMO | Source: Ambulatory Visit | Attending: Internal Medicine | Admitting: Internal Medicine

## 2022-12-11 DIAGNOSIS — G9389 Other specified disorders of brain: Secondary | ICD-10-CM | POA: Diagnosis not present

## 2022-12-11 DIAGNOSIS — C7931 Secondary malignant neoplasm of brain: Secondary | ICD-10-CM | POA: Insufficient documentation

## 2022-12-11 LAB — GLUCOSE, CAPILLARY: Glucose-Capillary: 98 mg/dL (ref 70–99)

## 2022-12-11 MED ORDER — FLUDEOXYGLUCOSE F - 18 (FDG) INJECTION
10.0000 | Freq: Once | INTRAVENOUS | Status: AC
  Administered 2022-12-11: 10 via INTRAVENOUS

## 2022-12-11 MED ORDER — HEPARIN SOD (PORK) LOCK FLUSH 100 UNIT/ML IV SOLN
500.0000 [IU] | Freq: Once | INTRAVENOUS | Status: AC
  Administered 2022-12-11: 500 [IU] via INTRAVENOUS
  Filled 2022-12-11: qty 5

## 2022-12-15 ENCOUNTER — Inpatient Hospital Stay: Payer: Medicare HMO | Attending: Oncology

## 2022-12-15 DIAGNOSIS — Z923 Personal history of irradiation: Secondary | ICD-10-CM | POA: Insufficient documentation

## 2022-12-15 DIAGNOSIS — F17210 Nicotine dependence, cigarettes, uncomplicated: Secondary | ICD-10-CM | POA: Insufficient documentation

## 2022-12-15 DIAGNOSIS — C3490 Malignant neoplasm of unspecified part of unspecified bronchus or lung: Secondary | ICD-10-CM | POA: Insufficient documentation

## 2022-12-15 DIAGNOSIS — Z79899 Other long term (current) drug therapy: Secondary | ICD-10-CM | POA: Insufficient documentation

## 2022-12-15 DIAGNOSIS — Z5112 Encounter for antineoplastic immunotherapy: Secondary | ICD-10-CM | POA: Insufficient documentation

## 2022-12-15 DIAGNOSIS — Z7952 Long term (current) use of systemic steroids: Secondary | ICD-10-CM | POA: Insufficient documentation

## 2022-12-15 DIAGNOSIS — I10 Essential (primary) hypertension: Secondary | ICD-10-CM | POA: Insufficient documentation

## 2022-12-15 DIAGNOSIS — C7931 Secondary malignant neoplasm of brain: Secondary | ICD-10-CM | POA: Insufficient documentation

## 2022-12-16 ENCOUNTER — Other Ambulatory Visit: Payer: Self-pay

## 2022-12-16 ENCOUNTER — Inpatient Hospital Stay (HOSPITAL_BASED_OUTPATIENT_CLINIC_OR_DEPARTMENT_OTHER): Payer: Medicare HMO | Admitting: Internal Medicine

## 2022-12-16 ENCOUNTER — Other Ambulatory Visit: Payer: Self-pay | Admitting: Internal Medicine

## 2022-12-16 ENCOUNTER — Telehealth: Payer: Self-pay | Admitting: Internal Medicine

## 2022-12-16 ENCOUNTER — Encounter: Payer: Self-pay | Admitting: Internal Medicine

## 2022-12-16 VITALS — BP 176/121

## 2022-12-16 DIAGNOSIS — Z5112 Encounter for antineoplastic immunotherapy: Secondary | ICD-10-CM | POA: Diagnosis not present

## 2022-12-16 DIAGNOSIS — F17210 Nicotine dependence, cigarettes, uncomplicated: Secondary | ICD-10-CM | POA: Diagnosis not present

## 2022-12-16 DIAGNOSIS — Z79899 Other long term (current) drug therapy: Secondary | ICD-10-CM | POA: Diagnosis not present

## 2022-12-16 DIAGNOSIS — Y842 Radiological procedure and radiotherapy as the cause of abnormal reaction of the patient, or of later complication, without mention of misadventure at the time of the procedure: Secondary | ICD-10-CM | POA: Insufficient documentation

## 2022-12-16 DIAGNOSIS — C3490 Malignant neoplasm of unspecified part of unspecified bronchus or lung: Secondary | ICD-10-CM | POA: Diagnosis not present

## 2022-12-16 DIAGNOSIS — I6789 Other cerebrovascular disease: Secondary | ICD-10-CM

## 2022-12-16 DIAGNOSIS — Z923 Personal history of irradiation: Secondary | ICD-10-CM | POA: Diagnosis not present

## 2022-12-16 DIAGNOSIS — I10 Essential (primary) hypertension: Secondary | ICD-10-CM | POA: Diagnosis not present

## 2022-12-16 DIAGNOSIS — C7931 Secondary malignant neoplasm of brain: Secondary | ICD-10-CM | POA: Diagnosis not present

## 2022-12-16 DIAGNOSIS — Z7952 Long term (current) use of systemic steroids: Secondary | ICD-10-CM | POA: Diagnosis not present

## 2022-12-16 MED ORDER — AMLODIPINE BESYLATE 5 MG PO TABS: 5.0000 mg | ORAL_TABLET | Freq: Every day | ORAL | 0 refills | Status: DC

## 2022-12-16 NOTE — Telephone Encounter (Signed)
Called patient with new appointment information per wq. Left voicemail with appointment details and contact information if needing to reschedule.

## 2022-12-16 NOTE — Progress Notes (Signed)
Pharmacist Chemotherapy Monitoring - Initial Assessment    Anticipated start date: 12/23/22   The following has been reviewed per standard work regarding the patient's treatment regimen: The patient's diagnosis, treatment plan and drug doses, and organ/hematologic function Lab orders and baseline tests specific to treatment regimen  The treatment plan start date, drug sequencing, and pre-medications Prior authorization status  Patient's documented medication list, including drug-drug interaction screen and prescriptions for anti-emetics and supportive care specific to the treatment regimen The drug concentrations, fluid compatibility, administration routes, and timing of the medications to be used The patient's access for treatment and lifetime cumulative dose history, if applicable  The patient's medication allergies and previous infusion related reactions, if applicable   Changes made to treatment plan:  treatment plan date  Follow up needed:  Pending authorization for treatment    Acquanetta Belling, RPH,  BCPS, BCOP 12/16/2022  3:29 PM

## 2022-12-16 NOTE — Progress Notes (Signed)
[facility] [place], [place] [address] [phone]   Interval Evaluation  Date of Service: 12/16/22 Patient Name: Spencer Butler Patient MRN: [id number] Patient DOB: 1956-09-10 Provider: Ventura Sellers, MD  Identifying Statement:  Spencer Butler is a 67 y.o. 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 Zada Finders 01/24/22: Salvage SRS x4 Tammi Klippel)  Interval History: Spencer Butler presents today for follow up after recent MRI brain.  He describes ongoing worsening of balance, gait.  He is in a wheelchair today, though walking otherwise remains independent.  He feels memory and cognition not improved.  He is dosing decadron 1mg  daily which has not helped his symptoms.  H+P (02/11/22) Patient presents today for follow up after recent [place] with Dr. Tammi Klippel.  He reports significant improvement in his right sided weakness following cyst aspiration, ommaya placement.  He does walk a little on his own, but uses a walker or wheelchair  for longer outings.  He continues to work with PT.  Takes care of most needs independently at home.  Did have one seizure earlier this month, right leg shaking.  No recurrence since dosing the Keppra 500mg  BID, but he complains of fatigue and irritability with the medication.  Medications: Current Outpatient Medications on File Prior to Visit  Medication Sig Dispense Refill   dexamethasone (DECADRON) 1 MG tablet Take 1 tablet (1 mg total) by mouth daily with breakfast. 60 tablet 1   dronabinol (MARINOL) 2.5 MG capsule Take 2.5 mg by mouth 2 (two) times daily.     lamoTRIgine (LAMICTAL) 25 MG tablet TAKE 2 TABLETS(50 MG) BY MOUTH DAILY 60 tablet 3   lidocaine-prilocaine (EMLA) cream Apply 1 Application topically as needed. 30 g 0   megestrol (MEGACE) 40 MG tablet Take 40 mg by mouth daily.     ondansetron (ZOFRAN) 4 MG tablet Take 1 tablet (4 mg total) by mouth every 4 (four) hours as needed for nausea. 90 tablet 3   pantoprazole (PROTONIX) 20 MG tablet TAKE 1 TABLET(20 MG) BY MOUTH DAILY 30 tablet 3   benzonatate (TESSALON) 100 MG capsule Take 1 capsule (100 mg total) by mouth 3 (three) times daily as needed for cough. (Patient not taking: Reported on 11/20/2022) 30 capsule 1   No current facility-administered medications on file prior to visit.    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[place])  Past Surgical History:  Past Surgical History:  Procedure Laterality Date   APPLICATION OF CRANIAL NAVIGATION N/A 01/08/2022   Procedure: APPLICATION OF CRANIAL NAVIGATION;  Surgeon: Judith Part, MD;  Location: [facility];  Service: Neurosurgery;  Laterality: N/A;   BURR HOLE Left 01/08/2022   Procedure: Left Ommaya placement with brainlab placement ventricular catheter;   Surgeon: Judith Part, MD;  Location: [facility];  Service: Neurosurgery;  Laterality: Left;   Social History:  Social History   Socioeconomic History   Marital status: Married    Spouse name: Not on file   Number of children: Not on file   Years of education: Not on file   Highest education level: Not on file  Occupational History   Not on file  Tobacco Use   Smoking status: Every Day    Packs/day: .5    Types: Cigarettes   Smokeless tobacco: Never  Vaping Use   Vaping Use: Never used  Substance and Sexual Activity   Alcohol use: Never   Drug use: Never   Sexual activity: Not on file  Other Topics Concern   Not on file  Social History Narrative   Not on file   Social Determinants of Health   Financial Resource Strain: Not on file  Food Insecurity: Not on file  Transportation Needs: Not on file  Physical Activity: Not on file  Stress: Not on file  Social Connections: Not on file  Intimate Partner Violence: Not on file   Family History: No family history on file.  Review of Systems: Constitutional: Doesn't report fevers, chills or abnormal weight loss Eyes: Doesn't report blurriness of vision Ears, nose, mouth, throat, and face: Doesn't report sore throat Respiratory: Doesn't report cough, dyspnea or wheezes Cardiovascular: Doesn't report palpitation, chest discomfort  Gastrointestinal:  Doesn't report nausea, constipation, diarrhea GU: Doesn't report incontinence Skin: Doesn't report skin rashes Neurological: Per HPI Musculoskeletal: Doesn't report joint pain Behavioral/Psych: Doesn't report anxiety  Physical Exam: Vitals:   12/16/22 1157 12/16/22 1159  BP: (!) 172/123 (!) 176/121    KPS: 80. General: Alert, cooperative, pleasant, in no acute distress Head: Normal EENT: No conjunctival injection or scleral icterus.  Lungs: Resp effort normal Cardiac: Regular rate Abdomen: Non-distended abdomen Skin: No rashes cyanosis or petechiae. Extremities:  No clubbing or edema  Neurologic Exam: Mental Status: Awake, alert, attentive to examiner. Oriented to self and environment. Language is fluent with intact comprehension. Psychomotor slowing. Cranial Nerves: Visual acuity is grossly normal. Visual fields are full. Extra-ocular movements intact. No ptosis. Face is symmetric Motor: Tone and bulk are normal. Power is 5/5 in right arm and leg. Reflexes are symmetric, no pathologic reflexes present.  Sensory: Intact to light touch Gait: Mild dystaxia   Labs: I have reviewed the data as listed    Component Value Date/Time   NA 137 11/13/2022 0000   K 4.1 11/13/2022 0000   CL 108 11/13/2022 0000   CO2 19 11/13/2022 0000   GLUCOSE 84 08/06/2020 1129   BUN 19 11/13/2022 0000   CREATININE 0.9 11/13/2022 0000   CREATININE 0.71 01/08/2022 1226   CREATININE 0.90 08/06/2020 1129   CALCIUM 9.3 11/13/2022 0000   PROT 6.7 08/06/2020 1129   ALBUMIN 4.4 11/13/2022 0000   AST 25 11/13/2022 0000   AST 14 (L) 08/06/2020 1129   ALT 26 11/13/2022 0000   ALT 9 08/06/2020 1129   ALKPHOS 72 11/13/2022 0000   BILITOT 0.6 08/06/2020 1129   GFRNONAA >60 01/08/2022 1226  GFRNONAA >60 08/06/2020 1129   Lab Results  Component Value Date   WBC 11.2 11/13/2022   NEUTROABS 9.07 11/13/2022   HGB 15.4 11/13/2022   HCT 48 11/13/2022   MCV 89.7 01/08/2022   PLT 230 11/13/2022   Imaging:  [facility] Clinician Interpretation: I have personally reviewed the CNS images as listed.  My interpretation, in the context of the patient's clinical presentation, is likely treatment effect  NM PET Metabolic Brain  Result Date: 12/11/2022 CLINICAL DATA:  Radiation necrosis versus brain metastasis progression. EXAM: NM PET METABOLIC BRAIN TECHNIQUE: 10.0 mCi F-18 FDG was injected intravenously. Full-ring PET imaging was performed from the vertex to skull base. CT data was obtained and used for attenuation correction and anatomic localization. FASTING BLOOD GLUCOSE:  Value: 98  mg/dl COMPARISON:  BRAIN MRI 11/18/2022 FINDINGS: No significant metabolic activity associated with the RIGHT parietal periatrial enhancing lesion identified on comparison MRI. Peripheral activity about the lesion is a significantly less than cortical metabolic activity essentially equal to white matter activity. Second cystic lesion superiorly in the LEFT parietal vertex has no significant radiotracer activity. IMPRESSION: 1. No significant metabolic activity associated with the RIGHT parietal periatrial enhancing (MR) lesion is most consistent with radiation tumor necrosis. 2. No significant metabolic activity associated with the high LEFT parietal cystic lesion consistent with radiation necrosis. 3. No evidence of tumor recurrence by FDG PET imaging. Electronically Signed   By: Suzy Bouchard M.D.   On: 12/11/2022 14:43   MR BRAIN W WO CONTRAST  Result Date: 11/20/2022 CLINICAL DATA:  CNS neoplasm, assess treatment response. Follow-up brain metastases EXAM: MRI HEAD WITHOUT AND WITH CONTRAST TECHNIQUE: Multiplanar, multiecho pulse sequences of the brain and surrounding structures were obtained without and with intravenous contrast. CONTRAST:  7.5 cc of UA intravenous COMPARISON:  07/31/2022 FINDINGS: Brain: No new lesion is seen. Dominant heterogeneously enhancing/necrotic appearing right periatrial mass has enlarged to 3 cm as compared to 2.5 cm on prior, with further spreading along the subependymal atrium of the right lateral ventricle. A pericystic nodule along the high left frontal parietal cortex has increased by 2-3 mm, now up to 7 mm in length on 15:16. Subcentimeter lesions remain unchanged in the right occipital lobe (13:92), Right frontal white matter (image 122), left frontal parietal cortex (image 123). No progression of the left frontal parietal cyst which is cannulated by Ommaya catheter. Confluent FLAIR hyperintensity in the cerebral white matter and pons, likely treatment related. Mild  superimposed swelling in the right parietal region attributed to edema, stable. Vascular: Major flow voids and vascular enhancements are preserved Skull and upper cervical spine: Normal marrow signal. Sinuses/Orbits: No focal marrow lesion. IMPRESSION: 1. Mild enlargement of the dominant enhancing mass in the right periatrial white matter, up to 3 cm with mild edema superimposed on the confluent white matter disease. 2. 3 mm increase of the pericystic nodule at the left frontal parietal vertex, measuring 7 mm today. 3. No new lesion. Electronically Signed   By: Jorje Guild M.D.   On: 11/20/2022 09:38      Assessment/Plan Radiation therapy induced brain necrosis - Plan: Infusion Appointment Request, Clinic Appointment Request, Lab Appointment Request, CBC with Differential ([facility] Only), Total Protein, Urine dipstick  Spencer Butler is clinically progressive today, with modest changes in balance and gait secondary to progressive left parietal lesion.  PET brain study done last week was supportive of radiation necrosis rather than tumor.  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  We reviewed side effects, including hypertension, bleeding/clotting events, and wound healing impairment.   For hypertension, he will establish care with this PCP.  We will initiate therapy with Norvasc 5mg  daily in the interim.  Decadron will stay at 1mg  daily for now.  Remains on observation with Dr. Bobby Rumpf for small cell.  We appreciate the opportunity to participate in the care of Spencer Butler.  He will return to clinic for initiation of avastin.  All questions were answered. The patient knows to call the  clinic with any problems, questions or concerns. No barriers to learning were detected.  The total time spent in the encounter was 40 minutes and more than 50% was on counseling and review of test results   Ventura Sellers, MD Medical Director of Neuro-Oncology [facility] [place] 12/16/22 1:13 PM

## 2022-12-17 DIAGNOSIS — I10 Essential (primary) hypertension: Secondary | ICD-10-CM | POA: Diagnosis not present

## 2022-12-18 ENCOUNTER — Telehealth: Payer: Self-pay

## 2022-12-18 NOTE — Telephone Encounter (Signed)
T/C from pt's wife stating pt went to his PCP yesterday  for his elevated BP.  BP has went down significantly and PCP will keep him on the Norvasc 5 mg and will continue to monitor.

## 2022-12-19 DIAGNOSIS — I639 Cerebral infarction, unspecified: Secondary | ICD-10-CM | POA: Diagnosis not present

## 2022-12-19 DIAGNOSIS — Z923 Personal history of irradiation: Secondary | ICD-10-CM | POA: Diagnosis not present

## 2022-12-19 DIAGNOSIS — G9389 Other specified disorders of brain: Secondary | ICD-10-CM | POA: Diagnosis not present

## 2022-12-19 DIAGNOSIS — C3490 Malignant neoplasm of unspecified part of unspecified bronchus or lung: Secondary | ICD-10-CM | POA: Diagnosis not present

## 2022-12-19 DIAGNOSIS — G939 Disorder of brain, unspecified: Secondary | ICD-10-CM | POA: Diagnosis not present

## 2022-12-19 DIAGNOSIS — M419 Scoliosis, unspecified: Secondary | ICD-10-CM | POA: Diagnosis not present

## 2022-12-19 DIAGNOSIS — F17210 Nicotine dependence, cigarettes, uncomplicated: Secondary | ICD-10-CM | POA: Diagnosis not present

## 2022-12-21 DIAGNOSIS — G9389 Other specified disorders of brain: Secondary | ICD-10-CM | POA: Diagnosis not present

## 2022-12-22 ENCOUNTER — Ambulatory Visit: Payer: Medicare HMO | Admitting: Internal Medicine

## 2022-12-22 ENCOUNTER — Telehealth: Payer: Self-pay | Admitting: *Deleted

## 2022-12-22 ENCOUNTER — Ambulatory Visit: Payer: Medicare HMO

## 2022-12-22 ENCOUNTER — Other Ambulatory Visit: Payer: Medicare HMO

## 2022-12-22 NOTE — Telephone Encounter (Signed)
Patients wife called to report that over the weekend the patient started leaning backwards and to the left which wasn't anything new but he started having some facial drooping so she took him to the emergency room at [facility].  They completed a CT head and MRI head.  Wife has those images on disc and plans to bring those to the appointment tomorrow so that Dr Mickeal Skinner can compare.  She states that [place] mentioned they were comparing to something they thought was older and might be inaccurate but they did not see any signs of a stroke.

## 2022-12-23 ENCOUNTER — Inpatient Hospital Stay: Payer: Medicare HMO

## 2022-12-23 ENCOUNTER — Other Ambulatory Visit: Payer: Self-pay

## 2022-12-23 ENCOUNTER — Inpatient Hospital Stay (HOSPITAL_BASED_OUTPATIENT_CLINIC_OR_DEPARTMENT_OTHER): Payer: Medicare HMO | Admitting: Internal Medicine

## 2022-12-23 VITALS — BP 146/102 | HR 70 | Temp 97.7°F | Resp 16

## 2022-12-23 VITALS — BP 136/105 | HR 103 | Temp 97.8°F | Resp 13 | Wt 151.8 lb

## 2022-12-23 DIAGNOSIS — I10 Essential (primary) hypertension: Secondary | ICD-10-CM | POA: Diagnosis not present

## 2022-12-23 DIAGNOSIS — Y842 Radiological procedure and radiotherapy as the cause of abnormal reaction of the patient, or of later complication, without mention of misadventure at the time of the procedure: Secondary | ICD-10-CM

## 2022-12-23 DIAGNOSIS — C3490 Malignant neoplasm of unspecified part of unspecified bronchus or lung: Secondary | ICD-10-CM | POA: Diagnosis not present

## 2022-12-23 DIAGNOSIS — R569 Unspecified convulsions: Secondary | ICD-10-CM | POA: Diagnosis not present

## 2022-12-23 DIAGNOSIS — C7931 Secondary malignant neoplasm of brain: Secondary | ICD-10-CM | POA: Diagnosis not present

## 2022-12-23 DIAGNOSIS — Z923 Personal history of irradiation: Secondary | ICD-10-CM | POA: Diagnosis not present

## 2022-12-23 DIAGNOSIS — I6789 Other cerebrovascular disease: Secondary | ICD-10-CM | POA: Diagnosis not present

## 2022-12-23 DIAGNOSIS — Z79899 Other long term (current) drug therapy: Secondary | ICD-10-CM | POA: Diagnosis not present

## 2022-12-23 DIAGNOSIS — Z7952 Long term (current) use of systemic steroids: Secondary | ICD-10-CM | POA: Diagnosis not present

## 2022-12-23 DIAGNOSIS — Z5112 Encounter for antineoplastic immunotherapy: Secondary | ICD-10-CM | POA: Diagnosis not present

## 2022-12-23 DIAGNOSIS — F17210 Nicotine dependence, cigarettes, uncomplicated: Secondary | ICD-10-CM | POA: Diagnosis not present

## 2022-12-23 LAB — CBC WITH DIFFERENTIAL (CANCER CENTER ONLY)
Abs Immature Granulocytes: 0.12 10*3/uL — ABNORMAL HIGH (ref 0.00–0.07)
Basophils Absolute: 0.1 10*3/uL (ref 0.0–0.1)
Basophils Relative: 0 %
Eosinophils Absolute: 0.1 10*3/uL (ref 0.0–0.5)
Eosinophils Relative: 0 %
HCT: 48.6 % (ref 39.0–52.0)
Hemoglobin: 15.7 g/dL (ref 13.0–17.0)
Immature Granulocytes: 1 %
Lymphocytes Relative: 15 %
Lymphs Abs: 1.8 10*3/uL (ref 0.7–4.0)
MCH: 28.4 pg (ref 26.0–34.0)
MCHC: 32.3 g/dL (ref 30.0–36.0)
MCV: 88 fL (ref 80.0–100.0)
Monocytes Absolute: 1 10*3/uL (ref 0.1–1.0)
Monocytes Relative: 8 %
Neutro Abs: 9.1 10*3/uL — ABNORMAL HIGH (ref 1.7–7.7)
Neutrophils Relative %: 76 %
Platelet Count: 267 10*3/uL (ref 150–400)
RBC: 5.52 MIL/uL (ref 4.22–5.81)
RDW: 15.7 % — ABNORMAL HIGH (ref 11.5–15.5)
WBC Count: 12.2 10*3/uL — ABNORMAL HIGH (ref 4.0–10.5)
nRBC: 0 % (ref 0.0–0.2)

## 2022-12-23 MED ORDER — SODIUM CHLORIDE 0.9 % IV SOLN
10.0000 mg/kg | Freq: Once | INTRAVENOUS | Status: AC
  Administered 2022-12-23: 700 mg via INTRAVENOUS
  Filled 2022-12-23: qty 12

## 2022-12-23 MED ORDER — HEPARIN SOD (PORK) LOCK FLUSH 100 UNIT/ML IV SOLN
500.0000 [IU] | Freq: Once | INTRAVENOUS | Status: AC | PRN
  Administered 2022-12-23: 500 [IU]

## 2022-12-23 MED ORDER — SODIUM CHLORIDE 0.9 % IV SOLN: Freq: Once | INTRAVENOUS | Status: AC

## 2022-12-23 MED ORDER — SODIUM CHLORIDE 0.9% FLUSH
10.0000 mL | INTRAVENOUS | Status: DC | PRN
  Administered 2022-12-23: 10 mL

## 2022-12-23 MED ORDER — LAMOTRIGINE 100 MG PO TABS: 100.0000 mg | ORAL_TABLET | Freq: Every day | ORAL | 3 refills | Status: DC

## 2022-12-23 NOTE — Progress Notes (Signed)
Per Dr Mickeal Skinner ok to proceed with tx with hypertension today. Also ok to proceed without urine protein today.

## 2022-12-23 NOTE — Patient Instructions (Signed)
[facility]  Discharge Instructions: Thank you for choosing [facility] to provide your oncology and hematology care.   If you have a lab appointment with the Cancer Center, please go directly to the Cancer Center and check in at the registration area.   Wear comfortable clothing and clothing appropriate for easy access to any Portacath or PICC line.   We strive to give you quality time with your provider. You may need to reschedule your appointment if you arrive late (15 or more minutes).  Arriving late affects you and other patients whose appointments are after yours.  Also, if you miss three or more appointments without notifying the office, you may be dismissed from the clinic at the provider's discretion.      For prescription refill requests, have your pharmacy contact our office and allow 72 hours for refills to be completed.    Today you received the following chemotherapy and/or immunotherapy agent: Bevacizumab (MVASI)   To help prevent nausea and vomiting after your treatment, we encourage you to take your nausea medication as directed.  BELOW ARE SYMPTOMS THAT SHOULD BE REPORTED IMMEDIATELY: *FEVER GREATER THAN 100.4 F (38 C) OR HIGHER *CHILLS OR SWEATING *NAUSEA AND VOMITING THAT IS NOT CONTROLLED WITH YOUR NAUSEA MEDICATION *UNUSUAL SHORTNESS OF BREATH *UNUSUAL BRUISING OR BLEEDING *URINARY PROBLEMS (pain or burning when urinating, or frequent urination) *BOWEL PROBLEMS (unusual diarrhea, constipation, pain near the anus) TENDERNESS IN MOUTH AND THROAT WITH OR WITHOUT PRESENCE OF ULCERS (sore throat, sores in mouth, or a toothache) UNUSUAL RASH, SWELLING OR PAIN  UNUSUAL VAGINAL DISCHARGE OR ITCHING   Items with * indicate a potential emergency and should be followed up as soon as possible or go to the Emergency Department if any problems should occur.  Please show the CHEMOTHERAPY ALERT CARD or IMMUNOTHERAPY ALERT CARD at  check-in to the Emergency Department and triage nurse.  Should you have questions after your visit or need to cancel or reschedule your appointment, please contact [facility]  Dept: [phone]  and follow the prompts.  Office hours are 8:00 a.m. to 4:30 p.m. Monday - Friday. Please note that voicemails left after 4:00 p.m. may not be returned until the following business day.  We are closed weekends and major holidays. You have access to a nurse at all times for urgent questions. Please call the main number to the clinic Dept: [phone] and follow the prompts.   For any non-urgent questions, you may also contact your provider using MyChart. We now offer e-Visits for anyone 18 and older to request care online for non-urgent symptoms. For details visit [email or web address].   Also download the MyChart app! Go to the app store, search "MyChart", open the app, select [facility], and log in with your MyChart username and password.  Bevacizumab Injection What is this medication? BEVACIZUMAB (be va SIZ yoo mab) treats some types of cancer. It works by blocking a protein that causes cancer cells to grow and multiply. This helps to slow or stop the spread of cancer cells. It is a monoclonal antibody. This medicine may be used for other purposes; ask your health care provider or pharmacist if you have questions. COMMON BRAND NAME(S): Alymsys, Avastin, MVASI, Zirabev What should I tell my care team before I take this medication? They need to know if you have any of these conditions: Blood clots Coughing up blood Having or recent surgery Heart failure High   blood pressure History of a connection between 2 or more body parts that do not usually connect (fistula) History of a tear in your stomach or intestines Protein in your urine An unusual or allergic reaction to bevacizumab, other medications, foods, dyes, or preservatives Pregnant or trying to get  pregnant Breast-feeding How should I use this medication? This medication is injected into a vein. It is given by your care team in a hospital or clinic setting. Talk to your care team the use of this medication in children. Special care may be needed. Overdosage: If you think you have taken too much of this medicine contact a poison control center or emergency room at once. NOTE: This medicine is only for you. Do not share this medicine with others. What if I miss a dose? Keep appointments for follow-up doses. It is important not to miss your dose. Call your care team if you are unable to keep an appointment. What may interact with this medication? Interactions are not expected. This list may not describe all possible interactions. Give your health care provider a list of all the medicines, herbs, non-prescription drugs, or dietary supplements you use. Also tell them if you smoke, drink alcohol, or use illegal drugs. Some items may interact with your medicine. What should I watch for while using this medication? Your condition will be monitored carefully while you are receiving this medication. You may need blood work while taking this medication. This medication may make you feel generally unwell. This is not uncommon as chemotherapy can affect healthy cells as well as cancer cells. Report any side effects. Continue your course of treatment even though you feel ill unless your care team tells you to stop. This medication may increase your risk to bruise or bleed. Call your care team if you notice any unusual bleeding. Before having surgery, talk to your care team to make sure it is ok. This medication can increase the risk of poor healing of your surgical site or wound. You will need to stop this medication for 28 days before surgery. After surgery, wait at least 28 days before restarting this medication. Make sure the surgical site or wound is healed enough before restarting this medication. Talk  to your care team if questions. Talk to your care team if you may be pregnant. Serious birth defects can occur if you take this medication during pregnancy and for 6 months after the last dose. Contraception is recommended while taking this medication and for 6 months after the last dose. Your care team can help you find the option that works for you. Do not breastfeed while taking this medication and for 6 months after the last dose. This medication can cause infertility. Talk to your care team if you are concerned about your fertility. What side effects may I notice from receiving this medication? Side effects that you should report to your care team as soon as possible: Allergic reactions--skin rash, itching, hives, swelling of the face, lips, tongue, or throat Bleeding--bloody or black, tar-like stools, vomiting blood or brown material that looks like coffee grounds, red or dark brown urine, small red or purple spots on skin, unusual bruising or bleeding Blood clot--pain, swelling, or warmth in the leg, shortness of breath, chest pain Heart attack--pain or tightness in the chest, shoulders, arms, or jaw, nausea, shortness of breath, cold or clammy skin, feeling faint or lightheaded Heart failure--shortness of breath, swelling of the ankles, feet, or hands, sudden weight gain, unusual weakness or fatigue Increase   in blood pressure Infection--fever, chills, cough, sore throat, wounds that don't heal, pain or trouble when passing urine, general feeling of discomfort or being unwell Infusion reactions--chest pain, shortness of breath or trouble breathing, feeling faint or lightheaded Kidney injury--decrease in the amount of urine, swelling of the ankles, hands, or feet Stomach pain that is severe, does not go away, or gets worse Stroke--sudden numbness or weakness of the face, arm, or leg, trouble speaking, confusion, trouble walking, loss of balance or coordination, dizziness, severe headache,  change in vision Sudden and severe headache, confusion, change in vision, seizures, which may be signs of posterior reversible encephalopathy syndrome (PRES) Side effects that usually do not require medical attention (report to your care team if they continue or are bothersome): Back pain Change in taste Diarrhea Dry skin Increased tears Nosebleed This list may not describe all possible side effects. Call your doctor for medical advice about side effects. You may report side effects to FDA at 1-800-FDA-1088. Where should I keep my medication? This medication is given in a hospital or clinic. It will not be stored at home. NOTE: This sheet is a summary. It may not cover all possible information. If you have questions about this medicine, talk to your doctor, pharmacist, or health care provider.  2023 Elsevier/Gold Standard (2022-01-17 00:00:00)    

## 2022-12-23 NOTE — Progress Notes (Signed)
[place] [facility] [place], [place] [address] [phone]   Interval Evaluation  Date of Service: 12/23/22 Patient Name: Spencer Butler Patient MRN: [id number] Patient DOB: 03-22-1956 Provider: Ventura [facility], MD  Identifying Statement:  Spencer Butler is a 67 y.o. 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 Zada Finders 01/24/22: Salvage SRS x4 Tammi Klippel)  Interval History: Spencer Butler presents today for initial avastin infusion.  He presented to ED this past week following episode of left sided weakness, left face drooping, confusion.  He underwent CNS imaging at [facility], did return to baseline within hours.  He is dosing decadron 1mg  daily which has not helped his symptoms.  Continues on Lamictal 50mg  daily as prior.  H+P (02/11/22) Patient presents today for follow up after recent [place] with Dr. Tammi Klippel.  He reports significant improvement in his right sided weakness following cyst aspiration, ommaya placement.  He does walk a little on his own, but uses a walker or wheelchair for longer outings.  He continues to work with PT.   Takes care of most needs independently at home.  Did have one seizure earlier this month, right leg shaking.  No recurrence since dosing the Keppra 500mg  BID, but he complains of fatigue and irritability with the medication.  Medications: Current Outpatient Medications on File Prior to Visit  Medication Sig Dispense Refill   amLODipine (NORVASC) 5 MG tablet Take 1 tablet (5 mg total) by mouth daily. 30 tablet 0   benzonatate (TESSALON) 100 MG capsule Take 1 capsule (100 mg total) by mouth 3 (three) times daily as needed for cough. (Patient not taking: Reported on 11/20/2022) 30 capsule 1   dexamethasone (DECADRON) 1 MG tablet Take 1 tablet (1 mg total) by mouth daily with breakfast. 60 tablet 1   dronabinol (MARINOL) 2.5 MG capsule Take 2.5 mg by mouth 2 (two) times daily.     lamoTRIgine (LAMICTAL) 25 MG tablet TAKE 2 TABLETS(50 MG) BY MOUTH DAILY 60 tablet 3   lidocaine-prilocaine (EMLA) cream Apply 1 Application topically as needed. 30 g 0   megestrol (MEGACE) 40 MG tablet Take 40 mg by mouth daily.     ondansetron (ZOFRAN) 4 MG tablet Take 1 tablet (4 mg total) by mouth every 4 (four) hours as needed for nausea. 90 tablet 3   pantoprazole (PROTONIX) 20 MG tablet TAKE 1 TABLET(20 MG) BY MOUTH DAILY 30 tablet 3   No current facility-administered medications on file prior to visit.    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[place])    Malignant neoplasm of mediastinum,  part unspecified ([place])    Malignant neoplasm of mediastinum, part unspecified [facility])    Past Surgical History:  Past Surgical History:  Procedure Laterality Date   APPLICATION OF CRANIAL NAVIGATION N/A 01/08/2022   Procedure: APPLICATION OF CRANIAL NAVIGATION;  Surgeon: Judith Part, MD;  Location: [facility];  Service: Neurosurgery;  Laterality: N/A;   BURR HOLE Left 01/08/2022   Procedure: Left Ommaya placement with  brainlab placement ventricular catheter;  Surgeon: Judith Part, MD;  Location: [facility];  Service: Neurosurgery;  Laterality: Left;   Social History:  Social History   Socioeconomic History   Marital status: Married    Spouse name: Not on file   Number of children: Not on file   Years of education: Not on file   Highest education level: Not on file  Occupational History   Not on file  Tobacco Use   Smoking status: Every Day    Packs/day: .5    Types: Cigarettes   Smokeless tobacco: Never  Vaping Use   Vaping Use: Never used  Substance and Sexual Activity   Alcohol use: Never   Drug use: Never   Sexual activity: Not on file  Other Topics Concern   Not on file  Social History Narrative   Not on file   Social Determinants of Health   Financial Resource Strain: Not on file  Food Insecurity: Not on file  Transportation Needs: Not on file  Physical Activity: Not on file  Stress: Not on file  Social Connections: Not on file  Intimate Partner Violence: Not on file   Family History: No family history on file.  Review of Systems: Constitutional: Doesn't report fevers, chills or abnormal weight loss Eyes: Doesn't report blurriness of vision Ears, nose, mouth, throat, and face: Doesn't report sore throat Respiratory: Doesn't report cough, dyspnea or wheezes Cardiovascular: Doesn't report palpitation, chest discomfort  Gastrointestinal:  Doesn't report nausea, constipation, diarrhea GU: Doesn't report incontinence Skin: Doesn't report skin rashes Neurological: Per HPI Musculoskeletal: Doesn't report joint pain Behavioral/Psych: Doesn't report anxiety  Physical Exam: Vitals:   12/23/22 1222  BP: (!) 136/105  Pulse: (!) 103  Resp: 13  Temp: 97.8 F (36.6 C)  SpO2: 97%    KPS: 80. General: Alert, cooperative, pleasant, in no acute distress Head: Normal EENT: No conjunctival injection or scleral icterus.  Lungs: Resp effort normal Cardiac: Regular  rate Abdomen: Non-distended abdomen Skin: No rashes cyanosis or petechiae. Extremities: No clubbing or edema  Neurologic Exam: Mental Status: Awake, alert, attentive to examiner. Oriented to self and environment. Language is fluent with intact comprehension. Psychomotor slowing. Cranial Nerves: Visual acuity is grossly normal. Visual fields are full. Extra-ocular movements intact. No ptosis. Face is symmetric Motor: Tone and bulk are normal. Power is 5/5 in right arm and leg. Reflexes are symmetric, no pathologic reflexes present.  Sensory: Intact to light touch Gait: Mild dystaxia   Labs: I have reviewed the data as listed    Component Value Date/Time   NA 137 11/13/2022 0000   K 4.1 11/13/2022 0000   CL 108 11/13/2022 0000   CO2 19 11/13/2022 0000   GLUCOSE 84 08/06/2020 1129   BUN 19 11/13/2022 0000   CREATININE 0.9 11/13/2022 0000   CREATININE 0.71 01/08/2022 1226   CREATININE 0.90 08/06/2020 1129   CALCIUM 9.3 11/13/2022 0000   PROT 6.7 08/06/2020 1129   ALBUMIN 4.4 11/13/2022 0000   AST 25 11/13/2022 0000   AST 14 (L) 08/06/2020 1129   ALT  26 11/13/2022 0000   ALT 9 08/06/2020 1129   ALKPHOS 72 11/13/2022 0000   BILITOT 0.6 08/06/2020 1129   GFRNONAA >60 01/08/2022 1226   GFRNONAA >60 08/06/2020 1129   Lab Results  Component Value Date   WBC 12.2 (H) 12/23/2022   NEUTROABS 9.1 (H) 12/23/2022   HGB 15.7 12/23/2022   HCT 48.6 12/23/2022   MCV 88.0 12/23/2022   PLT 267 12/23/2022   Imaging:  [facility] Clinician Interpretation: I have personally reviewed the CNS images as listed.  My interpretation, in the context of the patient's clinical presentation, is likely treatment effect  NM PET Metabolic Brain  Result Date: 12/11/2022 CLINICAL DATA:  Radiation necrosis versus brain metastasis progression. EXAM: NM PET METABOLIC BRAIN TECHNIQUE: 10.0 mCi F-18 FDG was injected intravenously. Full-ring PET imaging was performed from the vertex to skull base. CT data was  obtained and used for attenuation correction and anatomic localization. FASTING BLOOD GLUCOSE:  Value: 98 mg/dl COMPARISON:  BRAIN MRI 11/18/2022 FINDINGS: No significant metabolic activity associated with the RIGHT parietal periatrial enhancing lesion identified on comparison MRI. Peripheral activity about the lesion is a significantly less than cortical metabolic activity essentially equal to white matter activity. Second cystic lesion superiorly in the LEFT parietal vertex has no significant radiotracer activity. IMPRESSION: 1. No significant metabolic activity associated with the RIGHT parietal periatrial enhancing (MR) lesion is most consistent with radiation tumor necrosis. 2. No significant metabolic activity associated with the high LEFT parietal cystic lesion consistent with radiation necrosis. 3. No evidence of tumor recurrence by FDG PET imaging. Electronically Signed   By: Suzy Bouchard M.D.   On: 12/11/2022 14:43      Assessment/Plan Metastasis to brain [facility])  Radiation therapy induced brain necrosis - Plan: Clinic Appointment Request  Spencer Butler is cleared today to proceed with cylce #1 avastin 10mg /kg IV for refractory and symptomatic radiation necrosis.  Informed consent was obtained verbally at bedside to proceed with avastin.  Chemotherapy should be held for the following:  ANC less than 1,000  Platelets less than 100,000  LFT or creatinine greater than 2x ULN  If clinical concerns/contraindications develop  Recommended increasing Lamictal to 100mg  daily given suspicion for breakthrough seizure as reason for recent clinical episode, ED visit.  For hypertension, will con't Norvasc 5mg  daily.  Home BP readings have been normal.  Decadron will stay at 1mg  daily for now.  Remains on observation with Dr. Bobby Rumpf for small cell.  We appreciate the opportunity to participate in the care of Spencer Butler.  He will return to clinic in 3 weeks for cycle #2/3  avastin.  All questions were answered. The patient knows to call the clinic with any problems, questions or concerns. No barriers to learning were detected.  The total time spent in the encounter was 30 minutes and more than 50% was on counseling and review of test results   Ventura [facility], MD Medical Director of Neuro-Oncology [facility] [place] 12/23/22 12:23 PM

## 2022-12-24 ENCOUNTER — Telehealth: Payer: Self-pay

## 2022-12-24 NOTE — Telephone Encounter (Signed)
Ms. Bourdon states that her husband is doing fine. He is eating, drinking, and urinating well. She knows to call the office at [phone] if she has any questions or concerns.

## 2022-12-24 NOTE — Telephone Encounter (Signed)
-----   Message from Lester Lyman, RN sent at 12/23/2022  3:22 PM EDT ----- Regarding: Dr. Mickeal Skinner Pt. first time Bevacizumab. Tolerated well, no issues noted. Did have high blood pressure during visit, before and after, denied complaints of chest pain, dizziness, no shortness of breath noted. MD was aware. Thank you!

## 2022-12-30 ENCOUNTER — Other Ambulatory Visit: Payer: Self-pay

## 2023-01-08 ENCOUNTER — Telehealth: Payer: Self-pay | Admitting: Internal Medicine

## 2023-01-08 NOTE — Telephone Encounter (Signed)
Called patient regarding April appointments, left a voicemail.  

## 2023-01-12 ENCOUNTER — Other Ambulatory Visit: Payer: Self-pay | Admitting: *Deleted

## 2023-01-12 DIAGNOSIS — I6789 Other cerebrovascular disease: Secondary | ICD-10-CM

## 2023-01-13 ENCOUNTER — Inpatient Hospital Stay: Payer: Medicare HMO

## 2023-01-13 ENCOUNTER — Other Ambulatory Visit: Payer: Self-pay

## 2023-01-13 ENCOUNTER — Encounter: Payer: Self-pay | Admitting: Internal Medicine

## 2023-01-13 ENCOUNTER — Inpatient Hospital Stay (HOSPITAL_BASED_OUTPATIENT_CLINIC_OR_DEPARTMENT_OTHER): Payer: Medicare HMO | Admitting: Internal Medicine

## 2023-01-13 ENCOUNTER — Inpatient Hospital Stay: Payer: Medicare HMO | Attending: Oncology

## 2023-01-13 VITALS — BP 142/95 | HR 70 | Temp 97.8°F | Resp 16

## 2023-01-13 VITALS — BP 141/101 | HR 76 | Temp 98.0°F | Resp 16 | Ht 67.0 in | Wt 152.3 lb

## 2023-01-13 DIAGNOSIS — C3490 Malignant neoplasm of unspecified part of unspecified bronchus or lung: Secondary | ICD-10-CM

## 2023-01-13 DIAGNOSIS — Z79899 Other long term (current) drug therapy: Secondary | ICD-10-CM | POA: Insufficient documentation

## 2023-01-13 DIAGNOSIS — Y842 Radiological procedure and radiotherapy as the cause of abnormal reaction of the patient, or of later complication, without mention of misadventure at the time of the procedure: Secondary | ICD-10-CM

## 2023-01-13 DIAGNOSIS — I10 Essential (primary) hypertension: Secondary | ICD-10-CM | POA: Insufficient documentation

## 2023-01-13 DIAGNOSIS — Z7952 Long term (current) use of systemic steroids: Secondary | ICD-10-CM | POA: Diagnosis not present

## 2023-01-13 DIAGNOSIS — F17210 Nicotine dependence, cigarettes, uncomplicated: Secondary | ICD-10-CM | POA: Diagnosis not present

## 2023-01-13 DIAGNOSIS — C7931 Secondary malignant neoplasm of brain: Secondary | ICD-10-CM

## 2023-01-13 DIAGNOSIS — I6789 Other cerebrovascular disease: Secondary | ICD-10-CM | POA: Insufficient documentation

## 2023-01-13 DIAGNOSIS — Z5112 Encounter for antineoplastic immunotherapy: Secondary | ICD-10-CM | POA: Diagnosis present

## 2023-01-13 LAB — CBC WITH DIFFERENTIAL (CANCER CENTER ONLY)
Abs Immature Granulocytes: 0.12 10*3/uL — ABNORMAL HIGH (ref 0.00–0.07)
Basophils Absolute: 0 10*3/uL (ref 0.0–0.1)
Basophils Relative: 0 %
Eosinophils Absolute: 0 10*3/uL (ref 0.0–0.5)
Eosinophils Relative: 0 %
HCT: 46.2 % (ref 39.0–52.0)
Hemoglobin: 15.7 g/dL (ref 13.0–17.0)
Immature Granulocytes: 1 %
Lymphocytes Relative: 12 %
Lymphs Abs: 1.2 10*3/uL (ref 0.7–4.0)
MCH: 29.1 pg (ref 26.0–34.0)
MCHC: 34 g/dL (ref 30.0–36.0)
MCV: 85.7 fL (ref 80.0–100.0)
Monocytes Absolute: 0.5 10*3/uL (ref 0.1–1.0)
Monocytes Relative: 5 %
Neutro Abs: 8.3 10*3/uL — ABNORMAL HIGH (ref 1.7–7.7)
Neutrophils Relative %: 82 %
Platelet Count: 230 10*3/uL (ref 150–400)
RBC: 5.39 MIL/uL (ref 4.22–5.81)
RDW: 16 % — ABNORMAL HIGH (ref 11.5–15.5)
WBC Count: 10.2 10*3/uL (ref 4.0–10.5)
nRBC: 0 % (ref 0.0–0.2)

## 2023-01-13 LAB — CMP (CANCER CENTER ONLY)
ALT: 18 U/L (ref 0–44)
AST: 16 U/L (ref 15–41)
Albumin: 4.6 g/dL (ref 3.5–5.0)
Alkaline Phosphatase: 76 U/L (ref 38–126)
Anion gap: 10 (ref 5–15)
BUN: 14 mg/dL (ref 8–23)
CO2: 24 mmol/L (ref 22–32)
Calcium: 10 mg/dL (ref 8.9–10.3)
Chloride: 107 mmol/L (ref 98–111)
Creatinine: 0.82 mg/dL (ref 0.61–1.24)
GFR, Estimated: >60 mL/min (ref >60)
Glucose, Bld: 77 mg/dL (ref 70–99)
Potassium: 3.6 mmol/L (ref 3.5–5.1)
Sodium: 141 mmol/L (ref 135–145)
Total Bilirubin: 0.6 mg/dL (ref 0.3–1.2)
Total Protein: 7.9 g/dL (ref 6.5–8.1)

## 2023-01-13 LAB — TOTAL PROTEIN, URINE DIPSTICK: Protein, ur: NEGATIVE mg/dL

## 2023-01-13 MED ORDER — SODIUM CHLORIDE 0.9 % IV SOLN: Freq: Once | INTRAVENOUS | Status: AC

## 2023-01-13 MED ORDER — SODIUM CHLORIDE 0.9% FLUSH
10.0000 mL | INTRAVENOUS | Status: DC | PRN
  Administered 2023-01-13: 10 mL

## 2023-01-13 MED ORDER — AMLODIPINE BESYLATE 10 MG PO TABS: 10.0000 mg | ORAL_TABLET | Freq: Every day | ORAL | 2 refills | Status: DC

## 2023-01-13 MED ORDER — HEPARIN SOD (PORK) LOCK FLUSH 100 UNIT/ML IV SOLN
500.0000 [IU] | Freq: Once | INTRAVENOUS | Status: AC | PRN
  Administered 2023-01-13: 500 [IU]

## 2023-01-13 MED ORDER — SODIUM CHLORIDE 0.9 % IV SOLN
10.0000 mg/kg | Freq: Once | INTRAVENOUS | Status: AC
  Administered 2023-01-13: 700 mg via INTRAVENOUS
  Filled 2023-01-13: qty 12

## 2023-01-13 NOTE — Progress Notes (Signed)
[facility] [address]  [place], [place] [address] [phone]   Interval Evaluation  Date of Service: 01/13/23 Patient Name: Spencer Butler Patient MRN: [id number] Patient DOB: [birth year] Provider: [name], MD  Identifying Statement:  Spencer Butler is a 67 y.o. male with Metastasis to brain  Radiation therapy induced brain necrosis   Primary Cancer:  Oncologic History: Oncology History  Malignant neoplasm of mediastinum, part unspecified  06/19/2020 - 09/07/2020 Chemotherapy   Patient is on Treatment Plan : LUNG SMALL CELL Cisplatin D1 + Etoposide D1-3 q21d     07/01/2020 Initial Diagnosis   Malignant neoplasm of mediastinum, part unspecified ([facility])   Small cell lung cancer  06/12/2020 Cancer Staging   Staging form: Lung, AJCC 8th Edition - Clinical stage from 06/12/2020: Stage IIIA (cT4, cN0, cM0) - Signed by Weston Settle, MD on 10/03/2020   10/03/2020 Initial Diagnosis   Small cell lung cancer ([facility])    CNS Oncologic History: 10/08/21: P/w large cystic L parietal met, completes WBRT Thersa Salt) 01/08/22: Cystic progression, Ommaya drain placed by Dr. Maurice Small 01/24/22: Salvage SRS x4 Kathrynn Running)  Interval History: [name] presents today for second avastin infusion for radiation necrosis.  No further seizures since increasing the lamictal last visit.  He is dosing decadron 1mg  daily which has not helped his symptoms.  Occasional headaches.  H+P (02/11/22) Patient presents today for follow up after recent SRS with Dr. Kathrynn Running.  He reports significant improvement in his right sided weakness following cyst aspiration, ommaya placement.  He does walk a little on his own, but uses a walker or wheelchair for longer outings.  He continues to work with PT.  Takes care of most needs independently at home.  Did have one seizure earlier this month, right leg shaking.  No recurrence since dosing the Keppra 500mg  BID, but he  complains of fatigue and irritability with the medication.  Medications: Current Outpatient Medications on File Prior to Visit  Medication Sig Dispense Refill   amLODipine (NORVASC) 5 MG tablet Take 1 tablet (5 mg total) by mouth daily. 30 tablet 0   benzonatate (TESSALON) 100 MG capsule Take 1 capsule (100 mg total) by mouth 3 (three) times daily as needed for cough. 30 capsule 1   dexamethasone (DECADRON) 1 MG tablet Take 1 tablet (1 mg total) by mouth daily with breakfast. 60 tablet 1   dronabinol (MARINOL) 2.5 MG capsule Take 2.5 mg by mouth 2 (two) times daily.     lamoTRIgine (LAMICTAL) 100 MG tablet Take 1 tablet (100 mg total) by mouth daily. 30 tablet 3   lidocaine-prilocaine (EMLA) cream Apply 1 Application topically as needed. 30 g 0   megestrol (MEGACE) 40 MG tablet Take 40 mg by mouth daily.     ondansetron (ZOFRAN) 4 MG tablet Take 1 tablet (4 mg total) by mouth every 4 (four) hours as needed for nausea. 90 tablet 3   pantoprazole (PROTONIX) 20 MG tablet TAKE 1 TABLET(20 MG) BY MOUTH DAILY 30 tablet 3   No current facility-administered medications on file prior to visit.    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[facility])    Malignant neoplasm of mediastinum, part unspecified ([facility])    Malignant neoplasm of mediastinum, part unspecified ([facility])    Past Surgical History:  Past Surgical History:  Procedure Laterality Date   APPLICATION OF CRANIAL NAVIGATION N/A 01/08/2022  Procedure: APPLICATION OF CRANIAL NAVIGATION;  Surgeon: Jadene Pierini, MD;  [facility];  Service: Neurosurgery;  Laterality: N/A;   BURR HOLE Left 01/08/2022   Procedure: Left Ommaya placement with brainlab placement ventricular catheter;  Surgeon: Jadene Pierini, MD;  Location[facility];  Service: Neurosurgery;  Laterality: Left;   Social History:  Social History   Socioeconomic History    Marital status: Married    Spouse name: Not on file   Number of children: Not on file   Years of education: Not on file   Highest education level: Not on file  Occupational History   Not on file  Tobacco Use   Smoking status: Every Day    Packs/day: .5    Types: Cigarettes   Smokeless tobacco: Never  Vaping Use   Vaping Use: Never used  Substance and Sexual Activity   Alcohol use: Never   Drug use: Never   Sexual activity: Not on file  Other Topics Concern   Not on file  Social History Narrative   Not on file   Social Determinants of Health   Financial Resource Strain: Not on file  Food Insecurity: Not on file  Transportation Needs: Not on file  Physical Activity: Not on file  Stress: Not on file  Social Connections: Not on file  Intimate Partner Violence: Not on file   Family History: No family history on file.  Review of Systems: Constitutional: Doesn't report fevers, chills or abnormal weight loss Eyes: Doesn't report blurriness of vision Ears, nose, mouth, throat, and face: Doesn't report sore throat Respiratory: Doesn't report cough, dyspnea or wheezes Cardiovascular: Doesn't report palpitation, chest discomfort  Gastrointestinal:  Doesn't report nausea, constipation, diarrhea GU: Doesn't report incontinence Skin: Doesn't report skin rashes Neurological: Per HPI Musculoskeletal: Doesn't report joint pain Behavioral/Psych: Doesn't report anxiety  Physical Exam: Vitals:   01/13/23 1225  BP: (!) 141/101  Pulse: 76  Resp: 16  Temp: 98 F (36.7 C)  SpO2: 99%    KPS: 80. General: Alert, cooperative, pleasant, in no acute distress Head: Normal EENT: No conjunctival injection or scleral icterus.  Lungs: Resp effort normal Cardiac: Regular rate Abdomen: Non-distended abdomen Skin: No rashes cyanosis or petechiae. Extremities: No clubbing or edema  Neurologic Exam: Mental Status: Awake, alert, attentive to examiner. Oriented to self and environment.  Language is fluent with intact comprehension. Psychomotor slowing. Cranial Nerves: Visual acuity is grossly normal. Visual fields are full. Extra-ocular movements intact. No ptosis. Face is symmetric Motor: Tone and bulk are normal. Power is 5/5 in right arm and leg. Reflexes are symmetric, no pathologic reflexes present.  Sensory: Intact to light touch Gait: Mild dystaxia   Labs: I have reviewed the data as listed    Component Value Date/Time   NA 137 11/13/2022 0000   K 4.1 11/13/2022 0000   CL 108 11/13/2022 0000   CO2 19 11/13/2022 0000   GLUCOSE 84 08/06/2020 1129   BUN 19 11/13/2022 0000   CREATININE 0.9 11/13/2022 0000   CREATININE 0.71 01/08/2022 1226   CREATININE 0.90 08/06/2020 1129   CALCIUM 9.3 11/13/2022 0000   PROT 6.7 08/06/2020 1129   ALBUMIN 4.4 11/13/2022 0000   AST 25 11/13/2022 0000   AST 14 (L) 08/06/2020 1129   ALT 26 11/13/2022 0000   ALT 9 08/06/2020 1129   ALKPHOS 72 11/13/2022 0000   BILITOT 0.6 08/06/2020 1129   GFRNONAA >60 01/08/2022 1226   GFRNONAA >60 08/06/2020 1129   Lab Results  Component Value Date   WBC 10.2 01/13/2023   NEUTROABS 8.3 (H) 01/13/2023   HGB 15.7 01/13/2023   HCT 46.2 01/13/2023   MCV 85.7 01/13/2023   PLT 230 01/13/2023     Assessment/Plan Metastasis to brain  Radiation therapy induced brain necrosis  Morton Simson is cleared today to proceed with cylce #2 avastin /kg IV for refractory and symptomatic radiation necrosis.  Informed consent was obtained verbally at bedside to proceed with avastin.  Chemotherapy should be held for the following:  ANC less than 1,000  Platelets less than 100,000  LFT or creatinine greater than 2x ULN  If clinical concerns/contraindications develop  Recommended contniuing Lamictal to  daily for seizure prevention.  For hypertension secondary to avastin, will increase Norvasc to  daily.  Will formally discontinue decadron.  Remains on observation with Dr.  Melvyn Neth for small cell.  We appreciate the opportunity to participate in the care of [name].  He will return to clinic in 3 weeks for cycle #3/3 avastin.  MRI will be scheduled for the following week.  All questions were answered. The patient knows to call the clinic with any problems, questions or concerns. No barriers to learning were detected.  The total time spent in the encounter was 30 minutes and more than 50% was on counseling and review of test results   [name], MD Medical Director of Neuro-Oncology [facility] at [place] 01/13/23 12:44 PM

## 2023-01-13 NOTE — Patient Instructions (Signed)
[facility]  Discharge Instructions: Thank you for choosing [facility] to provide your oncology and hematology care.   If you have a lab appointment with the Cancer Center, please go directly to the Cancer Center and check in at the registration area.   Wear comfortable clothing and clothing appropriate for easy access to any Portacath or PICC line.   We strive to give you quality time with your provider. You may need to reschedule your appointment if you arrive late (15 or more minutes).  Arriving late affects you and other patients whose appointments are after yours.  Also, if you miss three or more appointments without notifying the office, you may be dismissed from the clinic at the provider's discretion.      For prescription refill requests, have your pharmacy contact our office and allow 72 hours for refills to be completed.    Today you received the following chemotherapy and/or immunotherapy agent: Bevacizumab (MVASI)   To help prevent nausea and vomiting after your treatment, we encourage you to take your nausea medication as directed.  BELOW ARE SYMPTOMS THAT SHOULD BE REPORTED IMMEDIATELY: *FEVER GREATER THAN 100.4 F (38 C) OR HIGHER *CHILLS OR SWEATING *NAUSEA AND VOMITING THAT IS NOT CONTROLLED WITH YOUR NAUSEA MEDICATION *UNUSUAL SHORTNESS OF BREATH *UNUSUAL BRUISING OR BLEEDING *URINARY PROBLEMS (pain or burning when urinating, or frequent urination) *BOWEL PROBLEMS (unusual diarrhea, constipation, pain near the anus) TENDERNESS IN MOUTH AND THROAT WITH OR WITHOUT PRESENCE OF ULCERS (sore throat, sores in mouth, or a toothache) UNUSUAL RASH, SWELLING OR PAIN  UNUSUAL VAGINAL DISCHARGE OR ITCHING   Items with * indicate a potential emergency and should be followed up as soon as possible or go to the Emergency Department if any problems should occur.  Please show the CHEMOTHERAPY ALERT CARD or IMMUNOTHERAPY ALERT CARD at  check-in to the Emergency Department and triage nurse.  Should you have questions after your visit or need to cancel or reschedule your appointment, please contact [facility]  Dept: [phone]  and follow the prompts.  Office hours are 8:00 a.m. to 4:30 p.m. Monday - Friday. Please note that voicemails left after 4:00 p.m. may not be returned until the following business day.  We are closed weekends and major holidays. You have access to a nurse at all times for urgent questions. Please call the main number to the clinic Dept: [phone] and follow the prompts.   For any non-urgent questions, you may also contact your provider using MyChart. We now offer e-Visits for anyone 18 and older to request care online for non-urgent symptoms. For details visit [email or web address].   Also download the MyChart app! Go to the app store, search "MyChart", open the app, select [facility], and log in with your MyChart username and password.  Bevacizumab Injection What is this medication? BEVACIZUMAB (be va SIZ yoo mab) treats some types of cancer. It works by blocking a protein that causes cancer cells to grow and multiply. This helps to slow or stop the spread of cancer cells. It is a monoclonal antibody. This medicine may be used for other purposes; ask your health care provider or pharmacist if you have questions. COMMON BRAND NAME(S): Alymsys, Avastin, MVASI, Zirabev What should I tell my care team before I take this medication? They need to know if you have any of these conditions: Blood clots Coughing up blood Having or recent surgery Heart failure High   blood pressure History of a connection between 2 or more body parts that do not usually connect (fistula) History of a tear in your stomach or intestines Protein in your urine An unusual or allergic reaction to bevacizumab, other medications, foods, dyes, or preservatives Pregnant or trying to get  pregnant Breast-feeding How should I use this medication? This medication is injected into a vein. It is given by your care team in a hospital or clinic setting. Talk to your care team the use of this medication in children. Special care may be needed. Overdosage: If you think you have taken too much of this medicine contact a poison control center or emergency room at once. NOTE: This medicine is only for you. Do not share this medicine with others. What if I miss a dose? Keep appointments for follow-up doses. It is important not to miss your dose. Call your care team if you are unable to keep an appointment. What may interact with this medication? Interactions are not expected. This list may not describe all possible interactions. Give your health care provider a list of all the medicines, herbs, non-prescription drugs, or dietary supplements you use. Also tell them if you smoke, drink alcohol, or use illegal drugs. Some items may interact with your medicine. What should I watch for while using this medication? Your condition will be monitored carefully while you are receiving this medication. You may need blood work while taking this medication. This medication may make you feel generally unwell. This is not uncommon as chemotherapy can affect healthy cells as well as cancer cells. Report any side effects. Continue your course of treatment even though you feel ill unless your care team tells you to stop. This medication may increase your risk to bruise or bleed. Call your care team if you notice any unusual bleeding. Before having surgery, talk to your care team to make sure it is ok. This medication can increase the risk of poor healing of your surgical site or wound. You will need to stop this medication for 28 days before surgery. After surgery, wait at least 28 days before restarting this medication. Make sure the surgical site or wound is healed enough before restarting this medication. Talk  to your care team if questions. Talk to your care team if you may be pregnant. Serious birth defects can occur if you take this medication during pregnancy and for 6 months after the last dose. Contraception is recommended while taking this medication and for 6 months after the last dose. Your care team can help you find the option that works for you. Do not breastfeed while taking this medication and for 6 months after the last dose. This medication can cause infertility. Talk to your care team if you are concerned about your fertility. What side effects may I notice from receiving this medication? Side effects that you should report to your care team as soon as possible: Allergic reactions--skin rash, itching, hives, swelling of the face, lips, tongue, or throat Bleeding--bloody or black, tar-like stools, vomiting blood or brown material that looks like coffee grounds, red or dark brown urine, small red or purple spots on skin, unusual bruising or bleeding Blood clot--pain, swelling, or warmth in the leg, shortness of breath, chest pain Heart attack--pain or tightness in the chest, shoulders, arms, or jaw, nausea, shortness of breath, cold or clammy skin, feeling faint or lightheaded Heart failure--shortness of breath, swelling of the ankles, feet, or hands, sudden weight gain, unusual weakness or fatigue Increase   in blood pressure Infection--fever, chills, cough, sore throat, wounds that don't heal, pain or trouble when passing urine, general feeling of discomfort or being unwell Infusion reactions--chest pain, shortness of breath or trouble breathing, feeling faint or lightheaded Kidney injury--decrease in the amount of urine, swelling of the ankles, hands, or feet Stomach pain that is severe, does not go away, or gets worse Stroke--sudden numbness or weakness of the face, arm, or leg, trouble speaking, confusion, trouble walking, loss of balance or coordination, dizziness, severe headache,  change in vision Sudden and severe headache, confusion, change in vision, seizures, which may be signs of posterior reversible encephalopathy syndrome (PRES) Side effects that usually do not require medical attention (report to your care team if they continue or are bothersome): Back pain Change in taste Diarrhea Dry skin Increased tears Nosebleed This list may not describe all possible side effects. Call your doctor for medical advice about side effects. You may report side effects to FDA at 1-800-FDA-1088. Where should I keep my medication? This medication is given in a hospital or clinic. It will not be stored at home. NOTE: This sheet is a summary. It may not cover all possible information. If you have questions about this medicine, talk to your doctor, pharmacist, or health care provider.  2023 Elsevier/Gold Standard (2022-01-17 00:00:00)    

## 2023-01-13 NOTE — Progress Notes (Signed)
Patient blood pressure hovering around the diastolic parameter of 100- Dr. Barbaraann Cao aware. See note -plan to proceed with treatment today.

## 2023-01-20 ENCOUNTER — Telehealth: Payer: Self-pay | Admitting: Internal Medicine

## 2023-01-20 NOTE — Telephone Encounter (Signed)
Called patient regarding upcoming May appointments, patient is notified.  ?

## 2023-02-02 ENCOUNTER — Other Ambulatory Visit: Payer: Self-pay | Admitting: *Deleted

## 2023-02-02 DIAGNOSIS — C7931 Secondary malignant neoplasm of brain: Secondary | ICD-10-CM

## 2023-02-03 ENCOUNTER — Inpatient Hospital Stay (HOSPITAL_BASED_OUTPATIENT_CLINIC_OR_DEPARTMENT_OTHER): Payer: Medicare HMO | Admitting: Internal Medicine

## 2023-02-03 ENCOUNTER — Inpatient Hospital Stay: Payer: Medicare HMO

## 2023-02-03 ENCOUNTER — Other Ambulatory Visit: Payer: Self-pay

## 2023-02-03 ENCOUNTER — Inpatient Hospital Stay: Payer: Medicare HMO | Attending: Oncology

## 2023-02-03 VITALS — BP 147/97 | HR 67 | Resp 16

## 2023-02-03 VITALS — BP 102/88 | HR 61 | Temp 97.3°F | Resp 18 | Wt 142.0 lb

## 2023-02-03 DIAGNOSIS — F17210 Nicotine dependence, cigarettes, uncomplicated: Secondary | ICD-10-CM | POA: Diagnosis not present

## 2023-02-03 DIAGNOSIS — Z923 Personal history of irradiation: Secondary | ICD-10-CM | POA: Diagnosis not present

## 2023-02-03 DIAGNOSIS — Z7952 Long term (current) use of systemic steroids: Secondary | ICD-10-CM | POA: Diagnosis not present

## 2023-02-03 DIAGNOSIS — C3490 Malignant neoplasm of unspecified part of unspecified bronchus or lung: Secondary | ICD-10-CM | POA: Diagnosis not present

## 2023-02-03 DIAGNOSIS — C7931 Secondary malignant neoplasm of brain: Secondary | ICD-10-CM

## 2023-02-03 DIAGNOSIS — Z79899 Other long term (current) drug therapy: Secondary | ICD-10-CM | POA: Diagnosis not present

## 2023-02-03 DIAGNOSIS — I6789 Other cerebrovascular disease: Secondary | ICD-10-CM | POA: Diagnosis not present

## 2023-02-03 DIAGNOSIS — I10 Essential (primary) hypertension: Secondary | ICD-10-CM | POA: Diagnosis not present

## 2023-02-03 DIAGNOSIS — Z5112 Encounter for antineoplastic immunotherapy: Secondary | ICD-10-CM | POA: Diagnosis not present

## 2023-02-03 DIAGNOSIS — Y842 Radiological procedure and radiotherapy as the cause of abnormal reaction of the patient, or of later complication, without mention of misadventure at the time of the procedure: Secondary | ICD-10-CM

## 2023-02-03 LAB — CBC WITH DIFFERENTIAL (CANCER CENTER ONLY)
Abs Immature Granulocytes: 0.02 10*3/uL (ref 0.00–0.07)
Basophils Absolute: 0.1 10*3/uL (ref 0.0–0.1)
Basophils Relative: 1 %
Eosinophils Absolute: 0.1 10*3/uL (ref 0.0–0.5)
Eosinophils Relative: 1 %
HCT: 49.9 % (ref 39.0–52.0)
Hemoglobin: 16.8 g/dL (ref 13.0–17.0)
Immature Granulocytes: 0 %
Lymphocytes Relative: 30 %
Lymphs Abs: 2 10*3/uL (ref 0.7–4.0)
MCH: 28.2 pg (ref 26.0–34.0)
MCHC: 33.7 g/dL (ref 30.0–36.0)
MCV: 83.7 fL (ref 80.0–100.0)
Monocytes Absolute: 0.7 10*3/uL (ref 0.1–1.0)
Monocytes Relative: 11 %
Neutro Abs: 3.8 10*3/uL (ref 1.7–7.7)
Neutrophils Relative %: 57 %
Platelet Count: 381 10*3/uL (ref 150–400)
RBC: 5.96 MIL/uL — ABNORMAL HIGH (ref 4.22–5.81)
RDW: 15.5 % (ref 11.5–15.5)
WBC Count: 6.6 10*3/uL (ref 4.0–10.5)
nRBC: 0 % (ref 0.0–0.2)

## 2023-02-03 LAB — CMP (CANCER CENTER ONLY)
ALT: 13 U/L (ref 0–44)
AST: 19 U/L (ref 15–41)
Albumin: 4 g/dL (ref 3.5–5.0)
Alkaline Phosphatase: 99 U/L (ref 38–126)
Anion gap: 13 (ref 5–15)
BUN: 10 mg/dL (ref 8–23)
CO2: 22 mmol/L (ref 22–32)
Calcium: 9.4 mg/dL (ref 8.9–10.3)
Chloride: 102 mmol/L (ref 98–111)
Creatinine: 0.86 mg/dL (ref 0.61–1.24)
GFR, Estimated: >60 mL/min (ref >60)
Glucose, Bld: 104 mg/dL — ABNORMAL HIGH (ref 70–99)
Potassium: 2.8 mmol/L — ABNORMAL LOW (ref 3.5–5.1)
Sodium: 137 mmol/L (ref 135–145)
Total Bilirubin: 1 mg/dL (ref 0.3–1.2)
Total Protein: 7.9 g/dL (ref 6.5–8.1)

## 2023-02-03 LAB — TOTAL PROTEIN, URINE DIPSTICK: Protein, ur: NEGATIVE mg/dL

## 2023-02-03 MED ORDER — SODIUM CHLORIDE 0.9% FLUSH
10.0000 mL | INTRAVENOUS | Status: DC | PRN
  Administered 2023-02-03: 10 mL

## 2023-02-03 MED ORDER — SODIUM CHLORIDE 0.9 % IV SOLN: Freq: Once | INTRAVENOUS | Status: AC

## 2023-02-03 MED ORDER — SODIUM CHLORIDE 0.9 % IV SOLN
10.0000 mg/kg | Freq: Once | INTRAVENOUS | Status: AC
  Administered 2023-02-03: 700 mg via INTRAVENOUS
  Filled 2023-02-03: qty 12

## 2023-02-03 MED ORDER — DEXAMETHASONE 2 MG PO TABS: 2.0000 mg | ORAL_TABLET | Freq: Every day | ORAL | 0 refills | Status: DC

## 2023-02-03 MED ORDER — SODIUM CHLORIDE 0.9 % IV SOLN
10.0000 mg | Freq: Once | INTRAVENOUS | Status: AC
  Administered 2023-02-03: 10 mg via INTRAVENOUS
  Filled 2023-02-03: qty 10

## 2023-02-03 MED ORDER — HEPARIN SOD (PORK) LOCK FLUSH 100 UNIT/ML IV SOLN
500.0000 [IU] | Freq: Once | INTRAVENOUS | Status: AC | PRN
  Administered 2023-02-03: 500 [IU]

## 2023-02-03 NOTE — Progress Notes (Signed)
[facility] [address]  [place], [place] [address] [phone]   Interval Evaluation  Date of Service: 02/03/23 Patient Name: Spencer Butler Patient MRN: [id number] Patient DOB: 01-13-1956 Provider: [name], MD  Identifying Statement:  Spencer Butler is a 67 y.o. male with Metastasis to brain [facility])  Radiation therapy induced brain necrosis   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HCC)  06/19/2020 - 09/07/2020 Chemotherapy   Patient is on Treatment Plan : LUNG SMALL CELL Cisplatin D1 + Etoposide D1-3 q21d     07/01/2020 Initial Diagnosis   Malignant neoplasm of mediastinum, part unspecified (HCC)   Small cell lung cancer (HCC)  06/12/2020 Cancer Staging   Staging form: Lung, AJCC 8th Edition - Clinical stage from 06/12/2020: Stage IIIA (cT4, cN0, cM0) - Signed by Weston Settle, MD on 10/03/2020   10/03/2020 Initial Diagnosis   Small cell lung cancer (HCC)    CNS Oncologic History: 10/08/21: P/w large cystic L parietal met, completes WBRT Thersa Salt) 01/08/22: Cystic progression, Ommaya drain placed by Dr. Maurice Small 01/24/22: Salvage SRS x4 Kathrynn Running)  Interval History: Spencer Butler presents today for final planned avastin infusion for radiation necrosis.  Today he describes worsened fatigue, lethargy, poor appetite, joint pain and headaches.  All of this was noted once he discontinued the decadron last week. No further seizures since increasing the lamictal.    H+P (02/11/22) Patient presents today for follow up after recent SRS with Dr. Kathrynn Running.  He reports significant improvement in his right sided weakness following cyst aspiration, ommaya placement.  He does walk a little on his own, but uses a walker or wheelchair for longer outings.  He continues to work with PT.  Takes care of most needs independently at home.  Did have one seizure earlier this month,  right leg shaking.  No recurrence since dosing the Keppra 500mg  BID, but he complains of fatigue and irritability with the medication.  Medications: Current Outpatient Medications on File Prior to Visit  Medication Sig Dispense Refill   amLODipine (NORVASC) 10 MG tablet Take 1 tablet (10 mg total) by mouth daily. 30 tablet 2   dronabinol (MARINOL) 2.5 MG capsule Take 2.5 mg by mouth 2 (two) times daily.     lamoTRIgine (LAMICTAL) 100 MG tablet Take 1 tablet (100 mg total) by mouth daily. 30 tablet 3   lidocaine-prilocaine (EMLA) cream Apply 1 Application topically as needed. 30 g 0   ondansetron (ZOFRAN) 4 MG tablet Take 1 tablet (4 mg total) by mouth every 4 (four) hours as needed for nausea. 90 tablet 3   pantoprazole (PROTONIX) 20 MG tablet TAKE 1 TABLET(20 MG) BY MOUTH DAILY 30 tablet 3   benzonatate (TESSALON) 100 MG capsule Take 1 capsule (100 mg total) by mouth 3 (three) times daily as needed for cough. (Patient not taking: Reported on 02/03/2023) 30 capsule 1   megestrol (MEGACE) 40 MG tablet Take 40 mg by mouth daily. (Patient not taking: Reported on 02/03/2023)     No current facility-administered medications on file prior to visit.    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HCC)    Malignant neoplasm of mediastinum, part unspecified (HCC)    Malignant neoplasm of mediastinum, part unspecified (HCC)    Past Surgical History:  Past Surgical History:  Procedure Laterality Date   APPLICATION OF  CRANIAL NAVIGATION N/A 01/08/2022   Procedure: APPLICATION OF CRANIAL NAVIGATION;  Surgeon: Jadene Pierini, MD;  Location: [facility] OR;  Service: Neurosurgery;  Laterality: N/A;   BURR HOLE Left 01/08/2022   Procedure: Left Ommaya placement with brainlab placement ventricular catheter;  Surgeon: Jadene Pierini, MD;  Location: [facility] OR;  Service: Neurosurgery;  Laterality: Left;   Social  History:  Social History   Socioeconomic History   Marital status: Married    Spouse name: Not on file   Number of children: Not on file   Years of education: Not on file   Highest education level: Not on file  Occupational History   Not on file  Tobacco Use   Smoking status: Every Day    Packs/day: .5    Types: Cigarettes   Smokeless tobacco: Never  Vaping Use   Vaping Use: Never used  Substance and Sexual Activity   Alcohol use: Never   Drug use: Never   Sexual activity: Not on file  Other Topics Concern   Not on file  Social History Narrative   Not on file   Social Determinants of Health   Financial Resource Strain: Not on file  Food Insecurity: Not on file  Transportation Needs: Not on file  Physical Activity: Not on file  Stress: Not on file  Social Connections: Not on file  Intimate Partner Violence: Not on file   Family History: No family history on file.  Review of Systems: Constitutional: Doesn't report fevers, chills or abnormal weight loss Eyes: Doesn't report blurriness of vision Ears, nose, mouth, throat, and face: Doesn't report sore throat Respiratory: Doesn't report cough, dyspnea or wheezes Cardiovascular: Doesn't report palpitation, chest discomfort  Gastrointestinal:  Doesn't report nausea, constipation, diarrhea GU: Doesn't report incontinence Skin: Doesn't report skin rashes Neurological: Per HPI Musculoskeletal: Doesn't report joint pain Behavioral/Psych: Doesn't report anxiety  Physical Exam: Vitals:   02/03/23 1134  BP: 102/88  Pulse: 61  Resp: 18  Temp: (!) 97.3 F (36.3 C)  SpO2: 100%    KPS: 80. General: Alert, cooperative, pleasant, in no acute distress Head: Normal EENT: No conjunctival injection or scleral icterus.  Lungs: Resp effort normal Cardiac: Regular rate Abdomen: Non-distended abdomen Skin: No rashes cyanosis or petechiae. Extremities: No clubbing or edema  Neurologic Exam: Mental Status: Awake, alert,  attentive to examiner. Oriented to self and environment. Language is fluent with intact comprehension. Psychomotor slowing. Cranial Nerves: Visual acuity is grossly normal. Visual fields are full. Extra-ocular movements intact. No ptosis. Face is symmetric Motor: Tone and bulk are normal. Power is 5/5 in right arm and leg. Reflexes are symmetric, no pathologic reflexes present.  Sensory: Intact to light touch Gait: Mild dystaxia   Labs: I have reviewed the data as listed    Component Value Date/Time   NA 141 01/13/2023 1147   NA 137 11/13/2022 0000   K 3.6 01/13/2023 1147   CL 107 01/13/2023 1147   CO2 24 01/13/2023 1147   GLUCOSE 77 01/13/2023 1147   BUN 14 01/13/2023 1147   BUN 19 11/13/2022 0000   CREATININE 0.82 01/13/2023 1147   CALCIUM 10.0 01/13/2023 1147   PROT 7.9 01/13/2023 1147   ALBUMIN 4.6 01/13/2023 1147   AST 16 01/13/2023 1147   ALT 18 01/13/2023 1147   ALKPHOS 76 01/13/2023 1147   BILITOT 0.6 01/13/2023 1147   GFRNONAA >60 01/13/2023 1147   Lab Results  Component Value Date   WBC 6.6 02/03/2023   NEUTROABS 3.8 02/03/2023  HGB 16.8 02/03/2023   HCT 49.9 02/03/2023   MCV 83.7 02/03/2023   PLT 381 02/03/2023     Assessment/Plan Metastasis to brain [facility])  Radiation therapy induced brain necrosis  Alston Nestor is cleared today to proceed with cycle #3/3 avastin 10mg /kg IV for refractory and symptomatic radiation necrosis.  Clinical changes we suspect are secondary to adrenal axis suppression, having discontinued decadron.  We will administer 10mg  IVPB during infusion today, then he should resumed 2mg  daily starting tomorrow.  MRI brain will be obtained in 1 week as well.  Chemotherapy should be held for the following:  ANC less than 1,000  Platelets less than 100,000  LFT or creatinine greater than 2x ULN  If clinical concerns/contraindications develop  Recommended contniuing Lamictal to 100mg  daily for seizure prevention.  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  Remains on observation with Dr. Melvyn Neth for small cell.  We appreciate the opportunity to participate in the care of [name].  He will return to clinic in 1 week for MRI brain review.  All questions were answered. The patient knows to call the clinic with any problems, questions or concerns. No barriers to learning were detected.  The total time spent in the encounter was 30 minutes and more than 50% was on counseling and review of test results   [name], MD Medical Director of Neuro-Oncology [facility] 02/03/23 12:00 PM

## 2023-02-03 NOTE — Patient Instructions (Signed)
[facility]  Discharge Instructions: Thank you for choosing [facility] to provide your oncology and hematology care.   If you have a lab appointment with the Cancer Center, please go directly to the Cancer Center and check in at the registration area.   Wear comfortable clothing and clothing appropriate for easy access to any Portacath or PICC line.   We strive to give you quality time with your provider. You may need to reschedule your appointment if you arrive late (15 or more minutes).  Arriving late affects you and other patients whose appointments are after yours.  Also, if you miss three or more appointments without notifying the office, you may be dismissed from the clinic at the provider's discretion.      For prescription refill requests, have your pharmacy contact our office and allow 72 hours for refills to be completed.    Today you received the following chemotherapy and/or immunotherapy agents: [id number]-awwb      To help prevent nausea and vomiting after your treatment, we encourage you to take your nausea medication as directed.  BELOW ARE SYMPTOMS THAT SHOULD BE REPORTED IMMEDIATELY: *FEVER GREATER THAN 100.4 F (38 C) OR HIGHER *CHILLS OR SWEATING *NAUSEA AND VOMITING THAT IS NOT CONTROLLED WITH YOUR NAUSEA MEDICATION *UNUSUAL SHORTNESS OF BREATH *UNUSUAL BRUISING OR BLEEDING *URINARY PROBLEMS (pain or burning when urinating, or frequent urination) *BOWEL PROBLEMS (unusual diarrhea, constipation, pain near the anus) TENDERNESS IN MOUTH AND THROAT WITH OR WITHOUT PRESENCE OF ULCERS (sore throat, sores in mouth, or a toothache) UNUSUAL RASH, SWELLING OR PAIN  UNUSUAL VAGINAL DISCHARGE OR ITCHING   Items with * indicate a potential emergency and should be followed up as soon as possible or go to the Emergency Department if any problems should occur.  Please show the CHEMOTHERAPY ALERT CARD or IMMUNOTHERAPY ALERT CARD  at check-in to the Emergency Department and triage nurse.  Should you have questions after your visit or need to cancel or reschedule your appointment, please contact [facility]  Dept: [phone]  and follow the prompts.  Office hours are 8:00 a.m. to 4:30 p.m. Monday - Friday. Please note that voicemails left after 4:00 p.m. may not be returned until the following business day.  We are closed weekends and major holidays. You have access to a nurse at all times for urgent questions. Please call the main number to the clinic Dept: [phone] and follow the prompts.   For any non-urgent questions, you may also contact your provider using MyChart. We now offer e-Visits for anyone 18 and older to request care online for non-urgent symptoms. For details visit [email or web address].   Also download the MyChart app! Go to the app store, search "MyChart", open the app, select , and log in with your MyChart username and password.   

## 2023-02-06 ENCOUNTER — Other Ambulatory Visit: Payer: Self-pay | Admitting: Internal Medicine

## 2023-02-06 ENCOUNTER — Telehealth: Payer: Self-pay | Admitting: Internal Medicine

## 2023-02-06 NOTE — Telephone Encounter (Signed)
Scheduled per 05/07 los, patient has been called and notified. 

## 2023-02-10 ENCOUNTER — Telehealth: Payer: Self-pay | Admitting: Dietician

## 2023-02-10 NOTE — Telephone Encounter (Signed)
Patient screened on MST. First attempt to reach. Provided my cell# on voice mail to return call to set up a nutrition consult. ° °Cyndi Sheryl Towell, RDN, LDN °Registered Dietitian, [facility] °Part Time Remote (Usual office hours: Tuesday-Thursday) °Cell: [phone]   °

## 2023-02-11 ENCOUNTER — Ambulatory Visit
Admission: RE | Admit: 2023-02-11 | Discharge: 2023-02-11 | Disposition: A | Discharge status: Home / Self Care | Payer: Medicare HMO | Source: Ambulatory Visit | Attending: Internal Medicine | Admitting: Internal Medicine

## 2023-02-11 DIAGNOSIS — C7931 Secondary malignant neoplasm of brain: Secondary | ICD-10-CM

## 2023-02-11 DIAGNOSIS — Y842 Radiological procedure and radiotherapy as the cause of abnormal reaction of the patient, or of later complication, without mention of misadventure at the time of the procedure: Secondary | ICD-10-CM | POA: Diagnosis not present

## 2023-02-11 DIAGNOSIS — I6789 Other cerebrovascular disease: Secondary | ICD-10-CM | POA: Diagnosis not present

## 2023-02-11 MED ORDER — HEPARIN SOD (PORK) LOCK FLUSH 100 UNIT/ML IV SOLN
500.0000 [IU] | Freq: Once | INTRAVENOUS | Status: AC
  Administered 2023-02-11: 500 [IU] via INTRAVENOUS

## 2023-02-11 MED ORDER — SODIUM CHLORIDE 0.9% FLUSH
10.0000 mL | INTRAVENOUS | Status: DC | PRN
  Administered 2023-02-11: 10 mL via INTRAVENOUS

## 2023-02-11 MED ORDER — GADOPICLENOL 0.5 MMOL/ML IV SOLN
7.0000 mL | Freq: Once | INTRAVENOUS | Status: AC | PRN
  Administered 2023-02-11: 7 mL via INTRAVENOUS

## 2023-02-12 ENCOUNTER — Other Ambulatory Visit: Payer: Self-pay

## 2023-02-12 ENCOUNTER — Inpatient Hospital Stay (HOSPITAL_BASED_OUTPATIENT_CLINIC_OR_DEPARTMENT_OTHER): Payer: Medicare HMO | Admitting: Internal Medicine

## 2023-02-12 VITALS — BP 133/98 | HR 86 | Temp 97.2°F | Resp 16 | Wt 141.9 lb

## 2023-02-12 DIAGNOSIS — Z79899 Other long term (current) drug therapy: Secondary | ICD-10-CM | POA: Diagnosis not present

## 2023-02-12 DIAGNOSIS — F17210 Nicotine dependence, cigarettes, uncomplicated: Secondary | ICD-10-CM | POA: Diagnosis not present

## 2023-02-12 DIAGNOSIS — I10 Essential (primary) hypertension: Secondary | ICD-10-CM | POA: Diagnosis not present

## 2023-02-12 DIAGNOSIS — C3490 Malignant neoplasm of unspecified part of unspecified bronchus or lung: Secondary | ICD-10-CM | POA: Diagnosis not present

## 2023-02-12 DIAGNOSIS — C7931 Secondary malignant neoplasm of brain: Secondary | ICD-10-CM

## 2023-02-12 DIAGNOSIS — Z7952 Long term (current) use of systemic steroids: Secondary | ICD-10-CM | POA: Diagnosis not present

## 2023-02-12 DIAGNOSIS — Z923 Personal history of irradiation: Secondary | ICD-10-CM | POA: Diagnosis not present

## 2023-02-12 DIAGNOSIS — Z5112 Encounter for antineoplastic immunotherapy: Secondary | ICD-10-CM | POA: Diagnosis not present

## 2023-02-12 MED ORDER — LAMOTRIGINE 100 MG PO TABS: 100.0000 mg | ORAL_TABLET | Freq: Every day | ORAL | 3 refills | Status: DC

## 2023-02-12 NOTE — Progress Notes (Signed)
[facility] at [facility] [address]  [place], [place] [address] [phone]   Interval Evaluation  Date of Service: 02/12/23 Patient Name: Spencer Butler Patient MRN: [id number] Patient DOB: Feb 14, 1956 Provider: [name], MD  Identifying Statement:  Spencer Butler is a 67 y.o. male with Metastasis to brain [facility])   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HCC)  06/19/2020 - 09/07/2020 Chemotherapy   Patient is on Treatment Plan : LUNG SMALL CELL Cisplatin D1 + Etoposide D1-3 q21d     07/01/2020 Initial Diagnosis   Malignant neoplasm of mediastinum, part unspecified (HCC)   Small cell lung cancer (HCC)  06/12/2020 Cancer Staging   Staging form: Lung, AJCC 8th Edition - Clinical stage from 06/12/2020: Stage IIIA (cT4, cN0, cM0) - Signed by Weston Settle, MD on 10/03/2020   10/03/2020 Initial Diagnosis   Small cell lung cancer (HCC)    CNS Oncologic History: 10/08/21: P/w large cystic L parietal met, completes WBRT Thersa Salt) 01/08/22: Cystic progression, Ommaya drain placed by Dr. Maurice Small 01/24/22: Salvage SRS x4 Kathrynn Running)  Interval History: Buck Ilgenfritz presents today following recent MRI brain.  Today he describes improvement in fatigue, lethargy, poor appetite, joint pain and headaches since resuming the decadron 2mg  daily.  He did experience a seizure with LOC this past weekend.  No illness or provocation per family.  Has been dosing Lamictal 100mg  daily.  H+P (02/11/22) Patient presents today for follow up after recent SRS with Dr. Kathrynn Running.  He reports significant improvement in his right sided weakness following cyst aspiration, ommaya placement.  He does walk a little on his own, but uses a walker or wheelchair for longer outings.  He continues to work with PT.  Takes care of most needs independently at home.  Did have one seizure earlier this month, right leg shaking.   No recurrence since dosing the Keppra 500mg  BID, but he complains of fatigue and irritability with the medication.  Medications: Current Outpatient Medications on File Prior to Visit  Medication Sig Dispense Refill   amLODipine (NORVASC) 10 MG tablet Take 1 tablet (10 mg total) by mouth daily. 30 tablet 2   benzonatate (TESSALON) 100 MG capsule Take 1 capsule (100 mg total) by mouth 3 (three) times daily as needed for cough. (Patient not taking: Reported on 02/03/2023) 30 capsule 1   dexamethasone (DECADRON) 2 MG tablet Take 1 tablet (2 mg total) by mouth daily. 60 tablet 0   dronabinol (MARINOL) 2.5 MG capsule Take 2.5 mg by mouth 2 (two) times daily.     lamoTRIgine (LAMICTAL) 100 MG tablet Take 1 tablet (100 mg total) by mouth daily. 30 tablet 3   lidocaine-prilocaine (EMLA) cream Apply 1 Application topically as needed. 30 g 0   megestrol (MEGACE) 40 MG tablet Take 40 mg by mouth daily. (Patient not taking: Reported on 02/03/2023)     ondansetron (ZOFRAN) 4 MG tablet Take 1 tablet (4 mg total) by mouth every 4 (four) hours as needed for nausea. 90 tablet 3   pantoprazole (PROTONIX) 20 MG tablet TAKE 1 TABLET(20 MG) BY MOUTH DAILY 30 tablet 3   No current facility-administered medications on file prior to visit.    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HCC)    Malignant neoplasm of mediastinum, part unspecified (HCC)    Malignant neoplasm of mediastinum, part unspecified (  HCC)    Past Surgical History:  Past Surgical History:  Procedure Laterality Date   APPLICATION OF CRANIAL NAVIGATION N/A 01/08/2022   Procedure: APPLICATION OF CRANIAL NAVIGATION;  Surgeon: Jadene Pierini, MD;  Location: [facility] OR;  Service: Neurosurgery;  Laterality: N/A;   BURR HOLE Left 01/08/2022   Procedure: Left Ommaya placement with brainlab placement ventricular catheter;  Surgeon: Jadene Pierini,  MD;  Location: [facility] OR;  Service: Neurosurgery;  Laterality: Left;   Social History:  Social History   Socioeconomic History   Marital status: Married    Spouse name: Not on file   Number of children: Not on file   Years of education: Not on file   Highest education level: Not on file  Occupational History   Not on file  Tobacco Use   Smoking status: Every Day    Packs/day: .5    Types: Cigarettes   Smokeless tobacco: Never  Vaping Use   Vaping Use: Never used  Substance and Sexual Activity   Alcohol use: Never   Drug use: Never   Sexual activity: Not on file  Other Topics Concern   Not on file  Social History Narrative   Not on file   Social Determinants of Health   Financial Resource Strain: Not on file  Food Insecurity: Not on file  Transportation Needs: Not on file  Physical Activity: Not on file  Stress: Not on file  Social Connections: Not on file  Intimate Partner Violence: Not on file   Family History: No family history on file.  Review of Systems: Constitutional: Doesn't report fevers, chills or abnormal weight loss Eyes: Doesn't report blurriness of vision Ears, nose, mouth, throat, and face: Doesn't report sore throat Respiratory: Doesn't report cough, dyspnea or wheezes Cardiovascular: Doesn't report palpitation, chest discomfort  Gastrointestinal:  Doesn't report nausea, constipation, diarrhea GU: Doesn't report incontinence Skin: Doesn't report skin rashes Neurological: Per HPI Musculoskeletal: Doesn't report joint pain Behavioral/Psych: Doesn't report anxiety  Physical Exam: Vitals:   02/12/23 0903  BP: (!) 133/98  Pulse: 86  Resp: 16  Temp: (!) 97.2 F (36.2 C)  SpO2: 99%    KPS: 80. General: Alert, cooperative, pleasant, in no acute distress Head: Normal EENT: No conjunctival injection or scleral icterus.  Lungs: Resp effort normal Cardiac: Regular rate Abdomen: Non-distended abdomen Skin: No rashes cyanosis or  petechiae. Extremities: No clubbing or edema  Neurologic Exam: Mental Status: Awake, alert, attentive to examiner. Oriented to self and environment. Language is fluent with intact comprehension. Psychomotor slowing. Cranial Nerves: Visual acuity is grossly normal. Visual fields are full. Extra-ocular movements intact. No ptosis. Face is symmetric Motor: Tone and bulk are normal. Power is 5/5 in right arm and leg. Reflexes are symmetric, no pathologic reflexes present.  Sensory: Intact to light touch Gait: Mild dystaxia   Labs: I have reviewed the data as listed    Component Value Date/Time   NA 137 02/03/2023 1122   NA 137 11/13/2022 0000   K 2.8 (L) 02/03/2023 1122   CL 102 02/03/2023 1122   CO2 22 02/03/2023 1122   GLUCOSE 104 (H) 02/03/2023 1122   BUN 10 02/03/2023 1122   BUN 19 11/13/2022 0000   CREATININE 0.86 02/03/2023 1122   CALCIUM 9.4 02/03/2023 1122   PROT 7.9 02/03/2023 1122   ALBUMIN 4.0 02/03/2023 1122   AST 19 02/03/2023 1122   ALT 13 02/03/2023 1122   ALKPHOS 99 02/03/2023 1122   BILITOT 1.0 02/03/2023 1122  GFRNONAA >60 02/03/2023 1122   Lab Results  Component Value Date   WBC 6.6 02/03/2023   NEUTROABS 3.8 02/03/2023   HGB 16.8 02/03/2023   HCT 49.9 02/03/2023   MCV 83.7 02/03/2023   PLT 381 02/03/2023    Imaging:  [facility] Clinician Interpretation: I have personally reviewed the CNS images as listed.  My interpretation, in the context of the patient's clinical presentation, is stable disease pending official read  No results found.   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  MRI brain was reviewed, it demonstrates stable findings, no tumorigenic progression.  Recommended increasing Lamictal to 100mg  BID given unprovoked breakthrough seizure.  Decadron will continue 2mg  daily for now.  We will  touch base with him in ~3 weeks via phone to begin gradual taper, will consider hydrocortisone bridge given adrenal insufficiency with prior withdrawal of steroid.  Remains on observation with Dr. Melvyn Neth for small cell.  We appreciate the opportunity to participate in the care of [name].  He will otherwise return to clinic in person in 3 months for MRI brain review.  All questions were answered. The patient knows to call the clinic with any problems, questions or concerns. No barriers to learning were detected.  The total time spent in the encounter was 40 minutes and more than 50% was on counseling and review of test results   [name], MD Medical Director of Neuro-Oncology [facility] 02/12/23 9:05 AM

## 2023-02-13 ENCOUNTER — Telehealth: Payer: Self-pay | Admitting: Internal Medicine

## 2023-02-13 NOTE — Telephone Encounter (Signed)
Scheduled per 05/16 los, patient has been called and voicemail was left.

## 2023-02-14 ENCOUNTER — Other Ambulatory Visit: Payer: Self-pay

## 2023-02-25 ENCOUNTER — Telehealth: Payer: Self-pay | Admitting: Dietician

## 2023-02-25 NOTE — Telephone Encounter (Signed)
Patient screened on MST. Second attempt to reach. Provided my cell# on voice mail and in text to return call to set up a nutrition consult.  Cyndi Jahlani Lorentz, RDN, LDN Registered Dietitian, [facility] Part Time Remote (Usual office hours: Tuesday-Thursday) Cell: [phone]   

## 2023-02-26 ENCOUNTER — Ambulatory Visit: Payer: Medicare HMO | Admitting: Dietician

## 2023-02-26 NOTE — Progress Notes (Signed)
Nutrition Assessment   Reason for Assessment: MST screen for weight loss.    ASSESSMENT: Patient's wife returned message, she states sh does all the speaking for patient now.  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 He will be followed by Dr. Melvyn Neth at [place].   Wife reports patients weight is increasing.  She states he's eating well and appetite has been good while on steroids.  He ocassionally takes some Marinol, out of Megace now.  Usual Intake: Cereal or eggs, bacon, biscuit  Yesterday at diner had meatloaf mashed, fried okra Wife grills a lot, pork chop, mashed potatoes, crescent rolls Chicken tenders, lemon pepper Not big on green vegetables r carrots, like tomatoes, sweet potatoes, and fruits including pineapple, blueberries, watermelon, likes cantaloupe when they can get a good one. Drinking: water, Gatorade, lemonade, rare to have soda, lactose free milk twice  Anthropometrics: wife reports recent weight gain 147# at home.  Height: 67" Weight:  02/12/23  141.8# 01/13/23  152.3# 12/23/22  151.7# UBW: up and down since cancer dx, lowest weight 113# (2021) BMI: 22.22    NUTRITION DIAGNOSIS: Previously inadequate PO intake to meet increased nutrient needs, r/t cancer but per weight and recall that spouse relayed resolving.  INTERVENTION: Relayed that nutrition services are wrap around service provided at no charge and encouraged continued communication if experiencing continued weight loss or any nutritional impact symptoms (NIS). Encouraged adequate calorie and protein energy intake  with nutrient dense foods when possible to maintain weight/strength. Relayed good sources of vitamins to include in fruit/vegetables to round out nutrients. Contact information provided encouraged spouse to reach out if anything changes with intake.   MONITORING, EVALUATION,  GOAL: weight, PO intake, Nutrition Impact Symptoms, labs Goal is weight maintenance  Next Visit: PRN at patient or provider request  Gennaro Africa, RDN, LDN Registered Dietitian, [facility] Part Time Remote (Usual office hours: Tuesday-Thursday) Cell: [phone]

## 2023-03-05 ENCOUNTER — Inpatient Hospital Stay: Payer: Medicare HMO | Attending: Oncology | Admitting: Internal Medicine

## 2023-03-05 DIAGNOSIS — C7931 Secondary malignant neoplasm of brain: Secondary | ICD-10-CM | POA: Diagnosis not present

## 2023-03-05 DIAGNOSIS — I6789 Other cerebrovascular disease: Secondary | ICD-10-CM

## 2023-03-05 DIAGNOSIS — C3490 Malignant neoplasm of unspecified part of unspecified bronchus or lung: Secondary | ICD-10-CM

## 2023-03-05 DIAGNOSIS — Y842 Radiological procedure and radiotherapy as the cause of abnormal reaction of the patient, or of later complication, without mention of misadventure at the time of the procedure: Secondary | ICD-10-CM

## 2023-03-05 MED ORDER — DEXAMETHASONE 1 MG PO TABS: ORAL_TABLET | ORAL | 0 refills | Status: AC

## 2023-03-05 NOTE — Progress Notes (Signed)
I connected with [name] on 03/05/23 at 10:00 AM EDT by telephone visit and verified that I am speaking with the correct person using two identifiers.  I discussed the limitations, risks, security and privacy concerns of performing an evaluation and management service by telemedicine and the availability of in-person appointments. I also discussed with the patient that there may be a patient responsible charge related to this service. The patient expressed understanding and agreed to proceed.  Other persons participating in the visit and their role in the encounter:  spouse   Patient's location:  Home Provider's location:  Office Chief Complaint:  Metastasis to brain [facility])  Radiation therapy induced brain necrosis  History of Present Ilness: Icker Grigas reports no clinical changes today.  He does not feel decline since decreasing decadron to 2mg  daily.  No further seizures since increasing the lamictal to 100mg  twice per day.  Tolerating it well.  Observations: Language and cognition at baseline  Assessment and Plan: Metastasis to brain [facility])  Radiation therapy induced brain necrosis  Clinically stable.  Recommended decreasing decadron to 1mg  daily x14 days, then 0.5mg  daily x14 days.  Con't Lamictal 100mg  BID.  Follow Up Instructions: RTC in August after MRI as scheduled  I discussed the assessment and treatment plan with the patient.  The patient was provided an opportunity to ask questions and all were answered.  The patient agreed with the plan and demonstrated understanding of the instructions.    The patient was advised to call back or seek an in-person evaluation if the symptoms worsen or if the condition fails to improve as anticipated.    [name], MD   I provided 22 minutes of non face-to-face telephone visit time during this encounter, and > 50% was spent counseling as documented under my assessment & plan.

## 2023-03-08 ENCOUNTER — Other Ambulatory Visit: Payer: Self-pay | Admitting: Internal Medicine

## 2023-03-09 ENCOUNTER — Encounter: Payer: Self-pay | Admitting: Internal Medicine

## 2023-03-15 NOTE — Progress Notes (Incomplete)
[facility]  [address] [place],  [place]  [address] [phone]  Clinic Day:  11/14/2022  Referring physician: Paulina Fusi, MD  HISTORY OF PRESENT ILLNESS:  The patient is a 67 y.o. 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  He comes in today to go over his recent chest CT to ensure there remains no radiographic evidence of disease recurrence.  Since his last visit, the patient has been doing fairly well.  He does still get mildly short of breath with significant exertion.  However, he denies having hemoptysis, coughing, or other respiratory symptoms which concern him for overt signs of disease recurrence.  Of note, he is scheduled to see neuro-oncology next week; a brain MRI is also scheduled for next week for his continued metastatic CNS surveillance.  With respect to his small cell lung cancer treatment history, he completed definitive chemoradiation, which consisted of 4 cycles of cisplatin/etoposide in October 2021.  Due to growth of a left large, cystic parietal lesion, he underwent whole brain radiation in January 2023.  After this lesion grew in April 2023, it was surgically drained, with an Ommaya reservoir placed in this location.  He received 4 doses of salvage stereotactic radiation in April 2023.  Interestingly enough, the fluid from this cystic mass did not show any malignant cells.  PHYSICAL EXAM:  There were no vitals taken for this visit. Wt Readings from Last 3 Encounters:  02/12/23 141 lb 14.4 oz (64.4 kg)  02/03/23 142 lb (64.4 kg)  01/13/23 152 lb 4.8 oz (69.1 kg)   There is no height or weight on file to calculate BMI. Performance status (ECOG):  0 Physical Exam Constitutional:      General: He is not in acute distress.    Appearance: He is not ill-appearing.     Comments: He looks stronger and has gained weight vs previous visits.  He walks in today on his own power.   He does have a chronically ill appearance with a moon facies.   HENT:     Head: Normocephalic.     Nose: Nose normal.     Mouth/Throat:     Mouth: Mucous membranes are moist.     Pharynx: Oropharynx is clear.  Cardiovascular:     Rate and Rhythm: Regular rhythm. Tachycardia present.     Heart sounds: No murmur heard.    No friction rub. No gallop.  Pulmonary:     Effort: Pulmonary effort is normal.     Breath sounds: Normal breath sounds.  Abdominal:     General: Bowel sounds are normal. There is no distension.     Palpations: Abdomen is soft.     Tenderness: There is no abdominal tenderness.  Musculoskeletal:     Cervical back: No rigidity.     Right lower leg: No edema.     Left lower leg: No edema.  Skin:    General: Skin is warm and dry.     Findings: No bruising, erythema or rash.  Neurological:     General: No focal deficit present.     Mental Status: He is oriented to person, place, and time.   SCANS:  His chest CT done yesterday revealed the following: FINDINGS: Cardiovascular: The heart is nonenlarged. No pericardial effusion. Scattered atherosclerotic changes along the thoracic aorta. Coronary artery calcifications are seen. Slight ectasia of the distal aortic arch with transverse dimension 3.6 cm. Unchanged from  prior. Right upper chest port.  Mediastinum/Nodes: No specific abnormal lymph node enlargement identified in the axillary region, hilum or mediastinum. There is specific wall thickening along the course of the mid to upper esophagus with the adjacent stranding and edema. This was seen previous. This could be sequela radiation change. Please correlate for any clinical evidence of esophagitis.  Lungs/Pleura: Centrilobular emphysematous lung changes are identified. There is apical pleural thickening and scarring. There is some basilar scar atelectasis. There is a calcified nodule at the right lung base on series 301, image 76 consistent with  old granulomatous disease, unchanged from prior no new dominant nodule. No effusion or pneumothorax.  Upper Abdomen: Along the upper abdomen the adrenal glands are preserved. Fatty liver infiltration.  Musculoskeletal: Curvature of the spine. Scattered degenerative changes. There is a subcutaneous rounded nodule in the upper central back on image 53 of series 2, unchanged from previous. Possible sebaceous cysts.  IMPRESSION: Stable CT scan.  No developing new mass lesion, fluid collection or lymph node enlargement.  Persistent wall thickening and adjacent stranding along the mid to upper thoracic esophagus. Again this could be posttreatment related such as radiation. Please correlate for any clinical evidence of esophagitis.  Aortic Atherosclerosis (ICD10-I70.0) and Emphysema (ICD10-J43.9).   ASSESSMENT & PLAN:  Assessment/Plan:  A 67 y.o. male with small cell lung cancer.  In clinic today, I went over his chest CT images with him, for which he could see there remains no evidence of disease recurrence.  Clinically, this gentleman continues to do very well.  As mentioned previously, he is scheduled for another brain MRI next week. I will defer to neurooncology as to how they wish to address his CNS disease..  As there are no other sites of disease outside of his brain, he will continue to be followed conservatively.  I will see him back in another 4 months, with a chest x-ray being done on the day of his next visit for his continued radiographic lung cancer surveillance.  The patient and his wife understand all the plans discussed today and are in agreement with them.    Jasline Buskirk Kirby Funk, MD

## 2023-03-15 NOTE — Progress Notes (Signed)
[facility]  [address] [place],  [place]  [address] [phone]  Clinic Day:  03/16/2023  Referring physician: Paulina Fusi, MD  HISTORY OF PRESENT ILLNESS:  The patient is a 67 y.o. 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  He comes in today to go over his chest x-ray to ensure there remains no radiographic evidence of disease recurrence.  Since his last visit, the patient has been doing fairly well.  He does still get mildly short of breath with significant exertion.  However, he denies having hemoptysis, coughing, or other respiratory symptoms which concern him for overt signs of disease recurrence.  Of note, he continues to get gradually weaned from Decadron by neuro-oncology next week.  He says he is scheduled for his next brain MRI in August 2024 for his continued metastatic CNS surveillance.  Despite our numerous discussions in the past about smoking abstinence, he continues to smoke a few cigarettes daily.  With respect to his small cell lung cancer treatment history, he completed definitive chemoradiation, which consisted of 4 cycles of cisplatin/etoposide in October 2021.  Due to growth of a left large, cystic parietal lesion, he underwent whole brain radiation in January 2023.  After this lesion grew in April 2023, it was surgically drained, with an Ommaya reservoir placed in this location.  He received 4 doses of salvage stereotactic radiation in April 2023.  Interestingly enough, the fluid from this cystic mass did not show any malignant cells.  As serial brain MRIs showed slight progression of his brain lesion, he also received 3 cycles of Avastin, which were completed in May 2024.   PHYSICAL EXAM:  Blood pressure (!) 140/97, pulse (!) 111, temperature 98.4 F (36.9 C), resp. rate 16, height 5\' 7"  (1.702 m), weight 144 lb 9.6 oz (65.6 kg), SpO2 97 %. Wt Readings from Last 3 Encounters:   03/16/23 144 lb 9.6 oz (65.6 kg)  02/12/23 141 lb 14.4 oz (64.4 kg)  02/03/23 142 lb (64.4 kg)   Body mass index is 22.65 kg/m. Performance status (ECOG):  0 Physical Exam Constitutional:      General: He is not in acute distress.    Appearance: He is not ill-appearing.     Comments: He does have a chronically ill appearance with a moon facies.   HENT:     Head: Normocephalic.     Nose: Nose normal.     Mouth/Throat:     Mouth: Mucous membranes are moist.     Pharynx: Oropharynx is clear.  Cardiovascular:     Rate and Rhythm: Regular rhythm. Tachycardia present.     Heart sounds: No murmur heard.    No friction rub. No gallop.  Pulmonary:     Effort: Pulmonary effort is normal.     Breath sounds: Normal breath sounds.  Abdominal:     General: Bowel sounds are normal. There is no distension.     Palpations: Abdomen is soft.     Tenderness: There is no abdominal tenderness.  Musculoskeletal:     Cervical back: No rigidity.     Right lower leg: No edema.     Left lower leg: No edema.  Skin:    General: Skin is warm and dry.     Findings: No bruising, erythema or rash.  Neurological:     General: No focal deficit present.     Mental Status: He is oriented to person,  place, and time.   SCANS:  His chest x-ray done today showed no active cardiopulmonary disease.  ASSESSMENT & PLAN:  Assessment/Plan:  A 67 y.o. male with small cell lung cancer.  In clinic today, I went over his chest x-ray images with him today, for which he could see there remains no evidence of disease recurrence.  Clinically, this gentleman continues to do very well.  As mentioned previously, he continues to get tapered on his Decadron therapy, per neuro-oncology.  As there are no other sites of disease outside of his brain, he will continue to be followed conservatively.  I will see him back in another 4 months, with a chest CT being done a day before his next visit for his continued radiographic lung  cancer surveillance.  The patient and his wife understand all the plans discussed today and are in agreement with them.    Alistar Mcenery Kirby Funk, MD

## 2023-03-16 ENCOUNTER — Other Ambulatory Visit: Payer: Self-pay | Admitting: Oncology

## 2023-03-16 ENCOUNTER — Inpatient Hospital Stay (INDEPENDENT_AMBULATORY_CARE_PROVIDER_SITE_OTHER): Payer: Medicare HMO | Admitting: Oncology

## 2023-03-16 VITALS — BP 140/97 | HR 111 | Temp 98.4°F | Resp 16 | Ht 67.0 in | Wt 144.6 lb

## 2023-03-16 DIAGNOSIS — Z85118 Personal history of other malignant neoplasm of bronchus and lung: Secondary | ICD-10-CM | POA: Diagnosis not present

## 2023-03-16 DIAGNOSIS — C383 Malignant neoplasm of mediastinum, part unspecified: Secondary | ICD-10-CM

## 2023-03-16 DIAGNOSIS — C3490 Malignant neoplasm of unspecified part of unspecified bronchus or lung: Secondary | ICD-10-CM

## 2023-03-17 ENCOUNTER — Other Ambulatory Visit: Payer: Self-pay

## 2023-03-24 ENCOUNTER — Other Ambulatory Visit: Payer: Self-pay | Admitting: Internal Medicine

## 2023-04-07 ENCOUNTER — Encounter: Payer: Self-pay | Admitting: Oncology

## 2023-04-11 ENCOUNTER — Other Ambulatory Visit: Payer: Self-pay | Admitting: Internal Medicine

## 2023-04-13 ENCOUNTER — Encounter: Payer: Self-pay | Admitting: Internal Medicine

## 2023-04-20 ENCOUNTER — Other Ambulatory Visit: Payer: Self-pay | Admitting: *Deleted

## 2023-04-20 ENCOUNTER — Telehealth: Payer: Self-pay | Admitting: *Deleted

## 2023-04-20 MED ORDER — LAMOTRIGINE 100 MG PO TABS: 100.0000 mg | ORAL_TABLET | Freq: Two times a day (BID) | ORAL | 3 refills | Status: DC

## 2023-04-20 MED ORDER — DEXAMETHASONE 4 MG PO TABS: 4.0000 mg | ORAL_TABLET | Freq: Two times a day (BID) | ORAL | 0 refills | Status: DC

## 2023-04-20 NOTE — Telephone Encounter (Signed)
Per Dr Barbaraann Cao patient needs to start Decadron 4 mg BID and remain on this dose until he is seen by him in 7 days.  Wife stated she has enough right now to start the dose.  Advised to start taking Lamictal 100 mg BID.  Corrected Rx in medication list.    Wife states understanding.

## 2023-04-20 NOTE — Addendum Note (Signed)
Addended by: Charisse Klinefelter on: 04/20/2023 10:05 AM   Modules accepted: Orders

## 2023-04-20 NOTE — Telephone Encounter (Signed)
Over the past week or so she has noticed a decline.  Patient has stopped eating for 2 days, reports feeling sick when eating, barely walks and when he does he has limited mobility  with shaking legs.  He is leaning to one side when sitting.  Reports having a seizure on 04/08/2023.  Reviewed medications and found patient tapered steroid back in June.  Dosing Lamictal 100 mg once a day and NOT BID as instructed.    Patient isn't due for next MRI until August and follow up after.    Pharmacy on file up to date.  Has some 2 mg Decadron on hand currently.  Wife wants to know if he should be seen sooner.    Routing to Dr Barbaraann Cao.

## 2023-04-27 ENCOUNTER — Inpatient Hospital Stay: Payer: Medicare HMO | Attending: Oncology | Admitting: Internal Medicine

## 2023-04-27 ENCOUNTER — Other Ambulatory Visit: Payer: Self-pay

## 2023-04-27 VITALS — BP 131/100 | HR 97 | Temp 97.7°F | Resp 18 | Ht 67.0 in | Wt 139.6 lb

## 2023-04-27 DIAGNOSIS — R569 Unspecified convulsions: Secondary | ICD-10-CM

## 2023-04-27 DIAGNOSIS — C7931 Secondary malignant neoplasm of brain: Secondary | ICD-10-CM | POA: Diagnosis not present

## 2023-04-27 DIAGNOSIS — F17210 Nicotine dependence, cigarettes, uncomplicated: Secondary | ICD-10-CM | POA: Diagnosis not present

## 2023-04-27 DIAGNOSIS — C3490 Malignant neoplasm of unspecified part of unspecified bronchus or lung: Secondary | ICD-10-CM | POA: Insufficient documentation

## 2023-04-27 DIAGNOSIS — Z7952 Long term (current) use of systemic steroids: Secondary | ICD-10-CM | POA: Diagnosis not present

## 2023-04-27 DIAGNOSIS — Z79899 Other long term (current) drug therapy: Secondary | ICD-10-CM | POA: Diagnosis not present

## 2023-04-27 DIAGNOSIS — I6789 Other cerebrovascular disease: Secondary | ICD-10-CM

## 2023-04-27 DIAGNOSIS — Y842 Radiological procedure and radiotherapy as the cause of abnormal reaction of the patient, or of later complication, without mention of misadventure at the time of the procedure: Secondary | ICD-10-CM

## 2023-04-27 MED ORDER — DEXAMETHASONE 4 MG PO TABS: 4.0000 mg | ORAL_TABLET | Freq: Every day | ORAL | 0 refills | Status: DC

## 2023-04-27 MED ORDER — LAMOTRIGINE 100 MG PO TABS: ORAL_TABLET | ORAL | 2 refills | Status: DC

## 2023-04-27 NOTE — Progress Notes (Signed)
[facility] at [facility] [address]  [place], [place] [address] [phone]   Interval Evaluation  Date of Service: 04/27/23 Patient Name: Spencer Butler Patient MRN: [id number] Patient DOB: March 14, 1956 Provider: [name], MD  Identifying Statement:  Spencer Butler is a 67 y.o. male with Metastasis to brain [facility])  Radiation therapy induced brain necrosis  Focal seizure (HCC)   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HCC)  06/19/2020 - 09/07/2020 Chemotherapy   Patient is on Treatment Plan : LUNG SMALL CELL Cisplatin D1 + Etoposide D1-3 q21d     07/01/2020 Initial Diagnosis   Malignant neoplasm of mediastinum, part unspecified (HCC)   Small cell lung cancer (HCC)  06/12/2020 Cancer Staging   Staging form: Lung, AJCC 8th Edition - Clinical stage from 06/12/2020: Stage IIIA (cT4, cN0, cM0) - Signed by Weston Settle, MD on 10/03/2020   10/03/2020 Initial Diagnosis   Small cell lung cancer (HCC)    CNS Oncologic History: 10/08/21: P/w large cystic L parietal met, completes WBRT Thersa Salt) 01/08/22: Cystic progression, Ommaya drain placed by Dr. Maurice Small 01/24/22: Salvage SRS x4 Kathrynn Running)  Interval History: Spencer Butler presents today given recent clinical symptoms.  Describes decline in overall function and energy level earlier this month after weaning off decadron. He also has had two breakthrough seizures, July 10th and 25th, despite increasing Lamictal back to 100mg  twice per day on the 22nd.  He describes improvement in fatigue, lethargy, poor appetite, joint pain and headaches since resuming the decadron 4mg  twice per day on July 22nd.  No other illness or provocation per family.  Lamictal currently at 100mg  BID, decadron at 4mg  daily.    H+P (02/11/22) Patient presents today for follow up after recent SRS with Dr. Kathrynn Running.  He reports significant improvement in his right  sided weakness following cyst aspiration, ommaya placement.  He does walk a little on his own, but uses a walker or wheelchair for longer outings.  He continues to work with PT.  Takes care of most needs independently at home.  Did have one seizure earlier this month, right leg shaking.  No recurrence since dosing the Keppra 500mg  BID, but he complains of fatigue and irritability with the medication.  Medications: Current Outpatient Medications on File Prior to Visit  Medication Sig Dispense Refill   dexamethasone (DECADRON) 4 MG tablet Take 1 tablet (4 mg total) by mouth 2 (two) times daily with a meal. 30 tablet 0   amLODipine (NORVASC) 10 MG tablet TAKE 1 TABLET(10 MG) BY MOUTH DAILY 30 tablet 2   benzonatate (TESSALON) 100 MG capsule Take 1 capsule (100 mg total) by mouth 3 (three) times daily as needed for cough. (Patient not taking: Reported on 02/03/2023) 30 capsule 1   dronabinol (MARINOL) 2.5 MG capsule Take 2.5 mg by mouth 2 (two) times daily.     lamoTRIgine (LAMICTAL) 100 MG tablet Take 1 tablet (100 mg total) by mouth 2 (two) times daily. 60 tablet 3   lidocaine-prilocaine (EMLA) cream Apply 1 Application topically as needed. 30 g 0   megestrol (MEGACE) 40 MG tablet Take 40 mg by mouth daily. (Patient not taking: Reported on 02/03/2023)     ondansetron (ZOFRAN) 4 MG tablet Take 1 tablet (4 mg total) by mouth every 4 (four) hours as needed for nausea. 90 tablet 3   pantoprazole (PROTONIX) 20 MG tablet TAKE 1 TABLET(20 MG) BY MOUTH DAILY 30 tablet 3  No current facility-administered medications on file prior to visit.    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HCC)    Malignant neoplasm of mediastinum, part unspecified (HCC)    Malignant neoplasm of mediastinum, part unspecified (HCC)    Past Surgical History:  Past Surgical History:  Procedure Laterality Date   APPLICATION  OF CRANIAL NAVIGATION N/A 01/08/2022   Procedure: APPLICATION OF CRANIAL NAVIGATION;  Surgeon: Jadene Pierini, MD;  Location: [facility] OR;  Service: Neurosurgery;  Laterality: N/A;   BURR HOLE Left 01/08/2022   Procedure: Left Ommaya placement with brainlab placement ventricular catheter;  Surgeon: Jadene Pierini, MD;  Location: [facility] OR;  Service: Neurosurgery;  Laterality: Left;   Social History:  Social History   Socioeconomic History   Marital status: Married    Spouse name: Not on file   Number of children: Not on file   Years of education: Not on file   Highest education level: Not on file  Occupational History   Not on file  Tobacco Use   Smoking status: Every Day    Current packs/day: 0.50    Types: Cigarettes   Smokeless tobacco: Never  Vaping Use   Vaping status: Never Used  Substance and Sexual Activity   Alcohol use: Never   Drug use: Never   Sexual activity: Not on file  Other Topics Concern   Not on file  Social History Narrative   Not on file   Social Determinants of Health   Financial Resource Strain: Low Risk  (09/05/2021)   Received from [facility], [facility]   Overall Financial Resource Strain (CARDIA)    Difficulty of Paying Living Expenses: Not very hard  Food Insecurity: No Food Insecurity (09/05/2021)   [facility] from [facility], [facility]   Hunger Vital Sign    Worried About Running Out of Food in the Last Year: Never true    Ran Out of Food in the Last Year: Never true  Transportation Needs: No Transportation Needs (09/05/2021)   [facility], [facility]   [facility] - Transportation    Lack of Transportation (Medical): No    Lack of Transportation (Non-Medical): No  Physical Activity: Not on file  Stress: Not on file  Social Connections: Not on file  Intimate Partner Violence: Not on file   Family History: No family history on file.  Review of Systems: Constitutional: Doesn't report fevers, chills or  abnormal weight loss Eyes: Doesn't report blurriness of vision Ears, nose, mouth, throat, and face: Doesn't report sore throat Respiratory: Doesn't report cough, dyspnea or wheezes Cardiovascular: Doesn't report palpitation, chest discomfort  Gastrointestinal:  Doesn't report nausea, constipation, diarrhea GU: Doesn't report incontinence Skin: Doesn't report skin rashes Neurological: Per HPI Musculoskeletal: Doesn't report joint pain Behavioral/Psych: Doesn't report anxiety  Physical Exam: Vitals:   04/27/23 1154  BP: (!) 131/100  Pulse: 97  Resp: 18  Temp: 97.7 F (36.5 C)  SpO2: 100%    KPS: 80. General: Alert, cooperative, pleasant, in no acute distress Head: Normal EENT: No conjunctival injection or scleral icterus.  Lungs: Resp effort normal Cardiac: Regular rate Abdomen: Non-distended abdomen Skin: No rashes cyanosis or petechiae. Extremities: No clubbing or edema  Neurologic Exam: Mental Status: Awake, alert, attentive to examiner. Oriented to self and environment. Language is fluent with intact comprehension. Psychomotor slowing. Cranial Nerves: Visual acuity is grossly  normal. Visual fields are full. Extra-ocular movements intact. No ptosis. Face is symmetric Motor: Tone and bulk are normal. Power is 5/5 in right arm and leg. Reflexes are symmetric, no pathologic reflexes present.  Sensory: Intact to light touch Gait: Mild dystaxia   Labs: I have reviewed the data as listed    Component Value Date/Time   NA 137 02/03/2023 1122   NA 137 11/13/2022 0000   K 2.8 (L) 02/03/2023 1122   CL 102 02/03/2023 1122   CO2 22 02/03/2023 1122   GLUCOSE 104 (H) 02/03/2023 1122   BUN 10 02/03/2023 1122   BUN 19 11/13/2022 0000   CREATININE 0.86 02/03/2023 1122   CALCIUM 9.4 02/03/2023 1122   PROT 7.9 02/03/2023 1122   ALBUMIN 4.0 02/03/2023 1122   AST 19 02/03/2023 1122   ALT 13 02/03/2023 1122   ALKPHOS 99 02/03/2023 1122   BILITOT 1.0 02/03/2023 1122   GFRNONAA  >60 02/03/2023 1122   Lab Results  Component Value Date   WBC 6.6 02/03/2023   NEUTROABS 3.8 02/03/2023   HGB 16.8 02/03/2023   HCT 49.9 02/03/2023   MCV 83.7 02/03/2023   PLT 381 02/03/2023    Imaging:  [facility] Clinician Interpretation: I have personally reviewed the CNS images as listed.  My interpretation, in the context of the patient's clinical presentation, is stable disease pending official read  No results found.   Assessment/Plan Metastasis to brain [facility])  Radiation therapy induced brain necrosis  Focal seizure (HCC)  Spencer Butler is clinically back to baseline today after several breakthrough seizures, functional decline.  He feels much improved with energy, appetite since the decadron was increased to 4mg  BID.  MRI brain is pending for 05/14/23, will keep that as is.   Recommended increasing Lamictal to 200mg  in AM, 100mg  in PM given breakthrough seizures.  Decadron will actually decrease to 4mg  daily if tolerated, he can maintain that dose until the post-MRI follow up in 2 weeks.  Remains on observation with Dr. Melvyn Neth for small cell.  We appreciate the opportunity to participate in the care of Spencer Butler.  He will otherwise return to clinic in person in 2-3 weeks for MRI brain review.  All questions were answered. The patient knows to call the clinic with any problems, questions or concerns. No barriers to learning were detected.  The total time spent in the encounter was 40 minutes and more than 50% was on counseling and review of test results   [name], MD Medical Director of Neuro-Oncology [facility] 04/27/23 12:11 PM

## 2023-05-06 ENCOUNTER — Encounter: Payer: Self-pay | Admitting: Internal Medicine

## 2023-05-14 ENCOUNTER — Ambulatory Visit
Admission: RE | Admit: 2023-05-14 | Discharge: 2023-05-14 | Disposition: A | Discharge status: Home / Self Care | Payer: Medicare HMO | Source: Ambulatory Visit | Attending: Internal Medicine | Admitting: Internal Medicine

## 2023-05-14 ENCOUNTER — Other Ambulatory Visit: Payer: Self-pay

## 2023-05-14 ENCOUNTER — Other Ambulatory Visit: Payer: Medicare HMO

## 2023-05-14 DIAGNOSIS — C7931 Secondary malignant neoplasm of brain: Secondary | ICD-10-CM

## 2023-05-14 MED ORDER — HEPARIN SOD (PORK) LOCK FLUSH 100 UNIT/ML IV SOLN: 500.0000 [IU] | Freq: Once | INTRAVENOUS | Status: DC

## 2023-05-14 MED ORDER — SODIUM CHLORIDE 0.9% FLUSH
10.0000 mL | INTRAVENOUS | Status: DC | PRN
  Administered 2023-05-14: 10 mL via INTRAVENOUS

## 2023-05-14 MED ORDER — GADOPICLENOL 0.5 MMOL/ML IV SOLN
7.0000 mL | Freq: Once | INTRAVENOUS | Status: AC | PRN
  Administered 2023-05-14: 7 mL via INTRAVENOUS

## 2023-05-21 ENCOUNTER — Inpatient Hospital Stay: Payer: Medicare HMO | Attending: Oncology | Admitting: Internal Medicine

## 2023-05-21 VITALS — BP 132/92 | HR 89 | Temp 98.4°F | Resp 18 | Wt 141.4 lb

## 2023-05-21 DIAGNOSIS — F17210 Nicotine dependence, cigarettes, uncomplicated: Secondary | ICD-10-CM | POA: Diagnosis not present

## 2023-05-21 DIAGNOSIS — C7931 Secondary malignant neoplasm of brain: Secondary | ICD-10-CM | POA: Diagnosis not present

## 2023-05-21 DIAGNOSIS — Y842 Radiological procedure and radiotherapy as the cause of abnormal reaction of the patient, or of later complication, without mention of misadventure at the time of the procedure: Secondary | ICD-10-CM

## 2023-05-21 DIAGNOSIS — I6789 Other cerebrovascular disease: Secondary | ICD-10-CM

## 2023-05-21 DIAGNOSIS — C3490 Malignant neoplasm of unspecified part of unspecified bronchus or lung: Secondary | ICD-10-CM | POA: Insufficient documentation

## 2023-05-21 DIAGNOSIS — Z7952 Long term (current) use of systemic steroids: Secondary | ICD-10-CM | POA: Diagnosis not present

## 2023-05-21 DIAGNOSIS — Z79899 Other long term (current) drug therapy: Secondary | ICD-10-CM | POA: Diagnosis not present

## 2023-05-21 MED ORDER — DEXAMETHASONE 1 MG PO TABS: ORAL_TABLET | ORAL | 0 refills | Status: AC

## 2023-05-21 NOTE — Progress Notes (Signed)
[facility] at [facility] [address]  [place], [place] [address] [phone]   Interval Evaluation  Date of Service: 05/21/23 Patient Name: Spencer Butler Patient MRN: [id number] Patient DOB: 01/16/1956 Provider: [name], MD  Identifying Statement:  Spencer Butler is a 67 y.o. male with Metastasis to brain [facility])  Radiation therapy induced brain necrosis   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HCC)  06/19/2020 - 09/07/2020 Chemotherapy   Patient is on Treatment Plan : LUNG SMALL CELL Cisplatin D1 + Etoposide D1-3 q21d     07/01/2020 Initial Diagnosis   Malignant neoplasm of mediastinum, part unspecified (HCC)   Small cell lung cancer (HCC)  06/12/2020 Cancer Staging   Staging form: Lung, AJCC 8th Edition - Clinical stage from 06/12/2020: Stage IIIA (cT4, cN0, cM0) - Signed by Weston Settle, MD on 10/03/2020   10/03/2020 Initial Diagnosis   Small cell lung cancer (HCC)    CNS Oncologic History: 10/08/21: P/w large cystic L parietal met, completes WBRT Thersa Salt) 01/08/22: Cystic progression, Ommaya drain placed by Dr. Maurice Small 01/24/22: Salvage SRS x4 Kathrynn Running)  Interval History: Spencer Butler presents today for follow up after recent MRI brain.  He is doing well overall, still having difficulty with gait as prior.  Has additional difficulty getting up stairs.  Walks with cane or walker at home or holds onto furniture.  No seizures since the increase in lamictal.  Decadron remains at 4mg  daily.  Last: Describes decline in overall function and energy level earlier this month after weaning off decadron. He also has had two breakthrough seizures, July 10th and 25th, despite increasing Lamictal back to 100mg  twice per day on the 22nd.  He describes improvement in fatigue, lethargy, poor appetite, joint pain and headaches since resuming the decadron 4mg  twice per day on July  22nd.  No other illness or provocation per family.  Lamictal currently at 100mg  BID, decadron at 4mg  daily.    H+P (02/11/22) Patient presents today for follow up after recent SRS with Dr. Kathrynn Running.  He reports significant improvement in his right sided weakness following cyst aspiration, ommaya placement.  He does walk a little on his own, but uses a walker or wheelchair for longer outings.  He continues to work with PT.  Takes care of most needs independently at home.  Did have one seizure earlier this month, right leg shaking.  No recurrence since dosing the Keppra 500mg  BID, but he complains of fatigue and irritability with the medication.  Medications: Current Outpatient Medications on File Prior to Visit  Medication Sig Dispense Refill   amLODipine (NORVASC) 10 MG tablet TAKE 1 TABLET(10 MG) BY MOUTH DAILY 30 tablet 2   benzonatate (TESSALON) 100 MG capsule Take 1 capsule (100 mg total) by mouth 3 (three) times daily as needed for cough. (Patient not taking: Reported on 02/03/2023) 30 capsule 1   dexamethasone (DECADRON) 4 MG tablet Take 1 tablet (4 mg total) by mouth daily. 60 tablet 0   dronabinol (MARINOL) 2.5 MG capsule Take 2.5 mg by mouth 2 (two) times daily.     lamoTRIgine (LAMICTAL) 100 MG tablet Take 2 tablets (200 mg total) by mouth daily AND 1 tablet (100 mg total) at bedtime. 90 tablet 2   lidocaine-prilocaine (EMLA) cream Apply 1 Application topically as needed. 30 g 0   megestrol (MEGACE) 40 MG tablet Take 40 mg by mouth daily. (Patient not taking: Reported on 02/03/2023)  ondansetron (ZOFRAN) 4 MG tablet Take 1 tablet (4 mg total) by mouth every 4 (four) hours as needed for nausea. 90 tablet 3   pantoprazole (PROTONIX) 20 MG tablet TAKE 1 TABLET(20 MG) BY MOUTH DAILY 30 tablet 3   No current facility-administered medications on file prior to visit.    Allergies: No Known Allergies Past Medical History:  Past Medical History:  Diagnosis Date   Malignant neoplasm of  mediastinum, part unspecified (HCC)    Malignant neoplasm of mediastinum, part unspecified (HCC)    Malignant neoplasm of mediastinum, part unspecified (HCC)    Past Surgical History:  Past Surgical History:  Procedure Laterality Date   APPLICATION OF CRANIAL NAVIGATION N/A 01/08/2022   Procedure: APPLICATION OF CRANIAL NAVIGATION;  Surgeon: Jadene Pierini, MD;  Location: [facility] OR;  Service: Neurosurgery;  Laterality: N/A;   BURR HOLE Left 01/08/2022   Procedure: Left Ommaya placement with brainlab placement ventricular catheter;  Surgeon: Jadene Pierini, MD;  Location: [facility] OR;  Service: Neurosurgery;  Laterality: Left;   Social History:  Social History   Socioeconomic History   Marital status: Married    Spouse name: Not on file   Number of children: Not on file   Years of education: Not on file   Highest education level: Not on file  Occupational History   Not on file  Tobacco Use   Smoking status: Every Day    Current packs/day: 0.50    Types: Cigarettes   Smokeless tobacco: Never  Vaping Use   Vaping status: Never Used  Substance and Sexual Activity   Alcohol use: Never   Drug use: Never   Sexual activity: Not on file  Other Topics Concern   Not on file  Social History Narrative   Not on file   Social Determinants of Health   Financial Resource Strain: Low Risk  (09/05/2021)   Received from [facility], [facility]   Overall Financial Resource Strain (CARDIA)    Difficulty of Paying Living Expenses: Not very hard  Food Insecurity: No Food Insecurity (09/05/2021)   Received from [facility], [facility]   Hunger Vital Sign    Worried About Running Out of Food in the Last Year: Never true    Ran Out of Food in the Last Year: Never true  Transportation Needs: No Transportation Needs (09/05/2021)   Received from [facility], [facility]   [facility] - Transportation    Lack of Transportation (Medical): No    Lack of Transportation  (Non-Medical): No  Physical Activity: Not on file  Stress: Not on file  Social Connections: Not on file  Intimate Partner Violence: Not on file   Family History: No family history on file.  Review of Systems: Constitutional: Doesn't report fevers, chills or abnormal weight loss Eyes: Doesn't report blurriness of vision Ears, nose, mouth, throat, and face: Doesn't report sore throat Respiratory: Doesn't report cough, dyspnea or wheezes Cardiovascular: Doesn't report palpitation, chest discomfort  Gastrointestinal:  Doesn't report nausea, constipation, diarrhea GU: Doesn't report incontinence Skin: Doesn't report skin rashes Neurological: Per HPI Musculoskeletal: Doesn't report joint pain Behavioral/Psych: Doesn't report anxiety  Physical Exam: Vitals:   05/21/23 0955  BP: (!) 132/92  Pulse: 89  Resp: 18  Temp: 98.4 F (36.9 C)  SpO2: 100%   KPS: 80. General: Alert, cooperative, pleasant, in no acute distress Head: Normal EENT: No conjunctival injection or scleral icterus.  Lungs: Resp effort normal Cardiac: Regular rate Abdomen:  Non-distended abdomen Skin: No rashes cyanosis or petechiae. Extremities: No clubbing or edema  Neurologic Exam: Mental Status: Awake, alert, attentive to examiner. Oriented to self and environment. Language is fluent with intact comprehension. Psychomotor slowing. Cranial Nerves: Visual acuity is grossly normal. Visual fields are full. Extra-ocular movements intact. No ptosis. Face is symmetric Motor: Tone and bulk are normal. Power is 5/5 in right arm and leg. Reflexes are symmetric, no pathologic reflexes present.  Sensory: Intact to light touch Gait: Mild dystaxia   Labs: I have reviewed the data as listed    Component Value Date/Time   NA 137 02/03/2023 1122   NA 137 11/13/2022 0000   K 2.8 (L) 02/03/2023 1122   CL 102 02/03/2023 1122   CO2 22 02/03/2023 1122   GLUCOSE 104 (H) 02/03/2023 1122   BUN 10 02/03/2023 1122   BUN 19  11/13/2022 0000   CREATININE 0.86 02/03/2023 1122   CALCIUM 9.4 02/03/2023 1122   PROT 7.9 02/03/2023 1122   ALBUMIN 4.0 02/03/2023 1122   AST 19 02/03/2023 1122   ALT 13 02/03/2023 1122   ALKPHOS 99 02/03/2023 1122   BILITOT 1.0 02/03/2023 1122   GFRNONAA >60 02/03/2023 1122   Lab Results  Component Value Date   WBC 6.6 02/03/2023   NEUTROABS 3.8 02/03/2023   HGB 16.8 02/03/2023   HCT 49.9 02/03/2023   MCV 83.7 02/03/2023   PLT 381 02/03/2023    Imaging:  [facility] Clinician Interpretation: I have personally reviewed the CNS images as listed.  My interpretation, in the context of the patient's clinical presentation, is stable disease pending official read  No results found.   Assessment/Plan Metastasis to brain [facility])  Radiation therapy induced brain necrosis  Spencer Butler is clinically stable today after functional decline earlier in the summer.  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  MRI brain demonstrates stable findings, pending official read.    Recommended continuing Lamictal to 200mg  in AM, 100mg  in PM.  Recommended decreasing decadron by 1mg  each week, starting with 3mg  daily tomorrow.  Dose may be modified if focal symptoms recur.  Remains on observation with Dr. Melvyn Neth for small cell.  Also discussed re-engaging with PT sessions for generalized weakness and balance issues.    We appreciate the opportunity to participate in the care of Spencer Butler.    We ask that Spencer Butler return to clinic in 4 months following next brain MRI, or sooner as needed.  All questions were answered. The patient knows to call the clinic with any problems, questions or concerns. No barriers to learning were detected.  The total time spent in the encounter was 40 minutes and more than 50% was on counseling and review of test results   [name], MD Medical Director of Neuro-Oncology [facility] [place] 05/21/23  9:53 AM

## 2023-05-24 ENCOUNTER — Other Ambulatory Visit: Payer: Self-pay

## 2023-06-15 ENCOUNTER — Telehealth: Payer: Self-pay | Admitting: *Deleted

## 2023-06-15 NOTE — Telephone Encounter (Signed)
Communicated response to patients wife.  Stated understanding

## 2023-06-15 NOTE — Telephone Encounter (Signed)
Spoke with patients wife.  Patient had breakthrough seizure on 06/13/2023 which lasted less than 1 minute even while dosing Lamictal 200 /100 mg. He had taken his last dose of steroids on 06/11/2023.  Wanted to let Dr Barbaraann Cao know.  Unsure if any changes are required.   Routing to Dr Barbaraann Cao.

## 2023-06-18 ENCOUNTER — Telehealth: Payer: Self-pay | Admitting: Oncology

## 2023-06-18 NOTE — Telephone Encounter (Signed)
06/18/23 LVM CT CHEST on 07/15/23 arrive at 1030am.Appt at 1130am

## 2023-06-22 ENCOUNTER — Telehealth: Payer: Self-pay | Admitting: *Deleted

## 2023-06-22 NOTE — Telephone Encounter (Signed)
Opened in error

## 2023-06-22 NOTE — Telephone Encounter (Signed)
Ms. Mccorquodale called. She said Spencer Butler has"gone downhill" in last week and a half and wants  to know what Dr. Barbaraann Cao advises. She reports following changes: - He hasn't been out of bed since last Friday except to use 2 person assist to stand and change depends. Prior to Friday, was able to walk with one person assist out to deck to sit outside and could stand with one person assist and transfer to bedside toilet.  - Eating only few bites of meals in last 4-5 days. Takes only small amounts of liquid. Denies hunger.  - No BM in past 4-5 days. Denies any abdominal discomfort. - Talking in whispers. Talking in sleep. - Sleeping a lot, but is restless when sleeping. She said they've weaned Spencer Butler off steroids as directed by Dr. Barbaraann Cao. She wants to know if he needs to be seen in office. She thinks their son can get him in the car, but she would need assistance at [facility] to get him into wheelchair.  Advised her that Dr. Barbaraann Cao will be informed.

## 2023-06-23 ENCOUNTER — Encounter: Payer: Self-pay | Admitting: Internal Medicine

## 2023-06-23 MED ORDER — DEXAMETHASONE 4 MG PO TABS: 4.0000 mg | ORAL_TABLET | Freq: Every day | ORAL | 0 refills | Status: DC

## 2023-06-23 NOTE — Telephone Encounter (Signed)
Communicated response to start back Decadron 4 mg daily to wife.  Advised Rx sent to pharmacy also.  Follow up appt scheduled.

## 2023-07-02 ENCOUNTER — Encounter: Payer: Self-pay | Admitting: Internal Medicine

## 2023-07-07 ENCOUNTER — Other Ambulatory Visit: Payer: Self-pay | Admitting: Internal Medicine

## 2023-07-08 ENCOUNTER — Other Ambulatory Visit: Payer: Self-pay | Admitting: Internal Medicine

## 2023-07-09 ENCOUNTER — Other Ambulatory Visit: Payer: Self-pay | Admitting: Internal Medicine

## 2023-07-09 ENCOUNTER — Other Ambulatory Visit: Payer: Self-pay

## 2023-07-09 ENCOUNTER — Inpatient Hospital Stay: Payer: Medicare HMO | Attending: Oncology | Admitting: Internal Medicine

## 2023-07-09 ENCOUNTER — Encounter: Payer: Self-pay | Admitting: Internal Medicine

## 2023-07-09 VITALS — BP 128/78 | HR 96 | Temp 97.5°F | Resp 18

## 2023-07-09 DIAGNOSIS — Z79899 Other long term (current) drug therapy: Secondary | ICD-10-CM | POA: Diagnosis not present

## 2023-07-09 DIAGNOSIS — F17210 Nicotine dependence, cigarettes, uncomplicated: Secondary | ICD-10-CM | POA: Insufficient documentation

## 2023-07-09 DIAGNOSIS — C3490 Malignant neoplasm of unspecified part of unspecified bronchus or lung: Secondary | ICD-10-CM | POA: Diagnosis not present

## 2023-07-09 DIAGNOSIS — Z7952 Long term (current) use of systemic steroids: Secondary | ICD-10-CM | POA: Diagnosis not present

## 2023-07-09 DIAGNOSIS — I6789 Other cerebrovascular disease: Secondary | ICD-10-CM | POA: Diagnosis not present

## 2023-07-09 DIAGNOSIS — C7931 Secondary malignant neoplasm of brain: Secondary | ICD-10-CM | POA: Insufficient documentation

## 2023-07-09 DIAGNOSIS — Y842 Radiological procedure and radiotherapy as the cause of abnormal reaction of the patient, or of later complication, without mention of misadventure at the time of the procedure: Secondary | ICD-10-CM

## 2023-07-09 NOTE — Progress Notes (Signed)
[facility] [address]  [place], [place] [address] [phone]   Interval Evaluation  Date of Service: 07/09/23 Patient Name: Spencer Butler Patient MRN: [id number] Patient DOB: [birth year] Provider: [name], MD  Identifying Statement:  Spencer Butler is a 67 y.o. 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HCC)  06/19/2020 - 09/07/2020 Chemotherapy   Patient is on Treatment Plan : LUNG Butler CELL Cisplatin D1 + Etoposide D1-3 q21d     07/01/2020 Initial Diagnosis   Malignant neoplasm of mediastinum, part unspecified (HCC)   Butler cell lung cancer (HCC)  06/12/2020 Cancer Staging   Staging form: Lung, AJCC 8th Edition - Clinical stage from 06/12/2020: Stage IIIA (cT4, cN0, cM0) - Signed by Spencer Settle, MD on 10/03/2020   10/03/2020 Initial Diagnosis   Butler cell lung cancer (HCC)    CNS Oncologic History: 10/08/21: P/w large cystic L parietal met, completes WBRT Spencer Butler) 01/08/22: Cystic progression, Spencer Butler drain placed by Dr. Maurice Butler 01/24/22: Salvage SRS x4 Spencer Butler)  Interval History: Spencer Butler presents today for follow up.  He has declined considerably in recent weeks; per family symptoms began on 06/17/23.  They describe worsening weakness, particularly of the right side.  Energy level is down, and appetite is very poor; not able to walk fully on his own or get up the stairs without significant assistance.  No seizures since the increase in lamictal.  Decadron remains at 4mg  daily after being restarted on 9/24.  Last: Describes decline in overall function and energy level earlier this month after weaning off decadron. He also has had two breakthrough seizures, July 10th and 25th, despite increasing Lamictal back to 100mg  twice per day on the 22nd.  He describes improvement  in fatigue, lethargy, poor appetite, joint pain and headaches since resuming the decadron 4mg  twice per day on July 22nd.  No other illness or provocation per family.  Lamictal currently at 100mg  BID, decadron at 4mg  daily.    H+P (02/11/22) Patient presents today for follow up after recent SRS with Dr. Kathrynn Butler.  He reports significant improvement in his right sided weakness following cyst aspiration, Spencer Butler placement.  He does walk a little on his own, but uses a walker or wheelchair for longer outings.  He continues to work with PT.  Takes care of most needs independently at home.  Did have one seizure earlier this month, right leg shaking.  No recurrence since dosing the Keppra 500mg  BID, but he complains of fatigue and irritability with the medication.  Medications: Current Outpatient Medications on File Prior to Visit  Medication Sig Dispense Refill   amLODipine (NORVASC) 10 MG tablet TAKE 1 TABLET(10 MG) BY MOUTH DAILY 30 tablet 2   benzonatate (TESSALON) 100 MG capsule Take 1 capsule (100 mg total) by mouth 3 (three) times daily as needed for cough. 30 capsule 1   dexamethasone (DECADRON) 4 MG tablet Take 1 tablet (4 mg total) by mouth daily. 30 tablet 0   dronabinol (MARINOL) 2.5 MG capsule Take 2.5 mg by mouth 2 (two) times daily.     lamoTRIgine (LAMICTAL) 100 MG tablet Take 2 tablets (200 mg total) by mouth daily AND 1 tablet (100 mg total) at bedtime. 90 tablet 2   lidocaine-prilocaine (EMLA) cream Apply 1 Application topically as needed. 30 g 0   megestrol (MEGACE) 40 MG tablet  Take 40 mg by mouth daily.     ondansetron (ZOFRAN) 4 MG tablet Take 1 tablet (4 mg total) by mouth every 4 (four) hours as needed for nausea. 90 tablet 3   pantoprazole (PROTONIX) 20 MG tablet TAKE 1 TABLET(20 MG) BY MOUTH DAILY 30 tablet 3   No current facility-administered medications on file prior to visit.    Allergies: No Known Allergies Past Medical History:  Past Medical History:  Diagnosis Date    Malignant neoplasm of mediastinum, part unspecified (HCC)    Malignant neoplasm of mediastinum, part unspecified (HCC)    Malignant neoplasm of mediastinum, part unspecified (HCC)    Past Surgical History:  Past Surgical History:  Procedure Laterality Date   APPLICATION OF CRANIAL NAVIGATION N/A 01/08/2022   Procedure: APPLICATION OF CRANIAL NAVIGATION;  Surgeon: Spencer Pierini, MD;  Location: [facility] OR;  Service: Neurosurgery;  Laterality: N/A;   BURR HOLE Left 01/08/2022   Procedure: Left Spencer Butler placement with brainlab placement ventricular catheter;  Surgeon: Spencer Pierini, MD;  Location: [facility];  Service: Neurosurgery;  Laterality: Left;   Social History:  Social History   Socioeconomic History   Marital status: Married    Spouse name: Not on file   Number of children: Not on file   Years of education: Not on file   Highest education level: Not on file  Occupational History   Not on file  Tobacco Use   Smoking status: Every Day    Current packs/day: 0.50    Types: Cigarettes   Smokeless tobacco: Never  Vaping Use   Vaping status: Never Used  Substance and Sexual Activity   Alcohol use: Never   Drug use: Never   Sexual activity: Not on file  Other Topics Concern   Not on file  Social History Narrative   Not on file   Social Determinants of Health   Financial Resource Strain: Low Risk  (09/05/2021)   Received from [facility], [facility]   Overall Financial Resource Strain (CARDIA)    Difficulty of Paying Living Expenses: Not very hard  Food Insecurity: No Food Insecurity (09/05/2021)   Received from [facility], [facility]   Hunger Vital Sign    Worried About Butler Out of Food in the Last Year: Never true    Ran Out of Food in the Last Year: Never true  Transportation Needs: No Transportation Needs (09/05/2021)   [facility] from [facility], [facility]   [facility] - Transportation    Lack of Transportation (Medical): No    Lack of  Transportation (Non-Medical): No  Physical Activity: Not on file  Stress: Not on file  Social Connections: Not on file  Intimate Partner Violence: Not on file   Family History: No family history on file.  Review of Systems: Constitutional: Doesn't report fevers, chills or abnormal weight loss Eyes: Doesn't report blurriness of vision Ears, nose, mouth, throat, and face: Doesn't report sore throat Respiratory: Doesn't report cough, dyspnea or wheezes Cardiovascular: Doesn't report palpitation, chest discomfort  Gastrointestinal:  Doesn't report nausea, constipation, diarrhea GU: Doesn't report incontinence Skin: Doesn't report skin rashes Neurological: Per HPI Musculoskeletal: Doesn't report joint pain Behavioral/Psych: Doesn't report anxiety  Physical Exam: Vitals:   07/09/23 1235  BP: 128/78  Pulse: 96  Resp: 18  Temp: (!) 97.5 F (36.4 C)  SpO2: 97%   KPS: 70. General: Alert, cooperative, pleasant, in no acute distress Head: Normal EENT: No conjunctival injection or scleral  icterus.  Lungs: Resp effort normal Cardiac: Regular rate Abdomen: Non-distended abdomen Skin: No rashes cyanosis or petechiae. Extremities: No clubbing or edema  Neurologic Exam: Mental Status: Awake, alert, attentive to examiner. Oriented to self and environment. Language is fluent with intact comprehension. Psychomotor slowing. Cranial Nerves: Visual acuity is grossly normal. Visual fields are full. Extra-ocular movements intact. No ptosis. Face is symmetric Motor: Tone and bulk are normal. Power is 4/5 in right arm and leg. Reflexes are symmetric, no pathologic reflexes present.  Sensory: Intact to light touch Gait: Hemiparetic, not independent   Labs: I have reviewed the data as listed    Component Value Date/Time   NA 137 02/03/2023 1122   NA 137 11/13/2022 0000   K 2.8 (L) 02/03/2023 1122   CL 102 02/03/2023 1122   CO2 22 02/03/2023 1122   GLUCOSE 104 (H) 02/03/2023 1122   BUN 10  02/03/2023 1122   BUN 19 11/13/2022 0000   CREATININE 0.86 02/03/2023 1122   CALCIUM 9.4 02/03/2023 1122   PROT 7.9 02/03/2023 1122   ALBUMIN 4.0 02/03/2023 1122   AST 19 02/03/2023 1122   ALT 13 02/03/2023 1122   ALKPHOS 99 02/03/2023 1122   BILITOT 1.0 02/03/2023 1122   GFRNONAA >60 02/03/2023 1122   Lab Results  Component Value Date   WBC 6.6 02/03/2023   NEUTROABS 3.8 02/03/2023   HGB 16.8 02/03/2023   HCT 49.9 02/03/2023   MCV 83.7 02/03/2023   PLT 381 02/03/2023     Assessment/Plan Metastasis to brain [facility])  Radiation therapy induced brain necrosis  Spencer Butler presents today with focal clinical decline, worsening right sided weakness.  Onset was apparently fairly rapid several weeks ago, resuming the decadron at 4mg  daily did help somewhat subjectively.  Etiology is unclear; recurrence of tumor, stroke, subclinical seizure(s), or systemic insult leading to downstream CNS impairment are all possible.    Decadron should increase further to 4mg  BID for now.  We will move up his brain MRI to later this week or early next week.  Recommended continuing Lamictal to 200mg  in AM, 100mg  in PM.  Remains on observation with Dr. Melvyn Neth for Butler cell.  We appreciate the opportunity to participate in the care of Spencer Butler.    We ask that Spencer Butler return to clinic in 1 week following rescheduled brain MRI, or sooner as needed.  All questions were answered. The patient knows to call the clinic with any problems, questions or concerns. No barriers to learning were detected.  The total time spent in the encounter was 40 minutes and more than 50% was on counseling and review of test results   [name], MD Medical Director of Neuro-Oncology [facility] 07/09/23 12:36 PM

## 2023-07-15 ENCOUNTER — Encounter (HOSPITAL_COMMUNITY): Payer: Self-pay | Admitting: Internal Medicine

## 2023-07-15 ENCOUNTER — Ambulatory Visit (HOSPITAL_COMMUNITY)
Admission: RE | Admit: 2023-07-15 | Discharge: 2023-07-15 | Disposition: A | Discharge status: Home / Self Care | Payer: Medicare HMO | Source: Ambulatory Visit | Attending: Internal Medicine | Admitting: Internal Medicine

## 2023-07-15 DIAGNOSIS — Y842 Radiological procedure and radiotherapy as the cause of abnormal reaction of the patient, or of later complication, without mention of misadventure at the time of the procedure: Secondary | ICD-10-CM | POA: Diagnosis not present

## 2023-07-15 DIAGNOSIS — J929 Pleural plaque without asbestos: Secondary | ICD-10-CM | POA: Diagnosis not present

## 2023-07-15 DIAGNOSIS — I6789 Other cerebrovascular disease: Secondary | ICD-10-CM | POA: Diagnosis not present

## 2023-07-15 DIAGNOSIS — Z85118 Personal history of other malignant neoplasm of bronchus and lung: Secondary | ICD-10-CM | POA: Diagnosis not present

## 2023-07-15 DIAGNOSIS — R9082 White matter disease, unspecified: Secondary | ICD-10-CM | POA: Diagnosis not present

## 2023-07-15 DIAGNOSIS — I700 Atherosclerosis of aorta: Secondary | ICD-10-CM | POA: Diagnosis not present

## 2023-07-15 DIAGNOSIS — C7931 Secondary malignant neoplasm of brain: Secondary | ICD-10-CM | POA: Diagnosis not present

## 2023-07-15 DIAGNOSIS — C383 Malignant neoplasm of mediastinum, part unspecified: Secondary | ICD-10-CM | POA: Diagnosis not present

## 2023-07-15 DIAGNOSIS — J439 Emphysema, unspecified: Secondary | ICD-10-CM | POA: Diagnosis not present

## 2023-07-15 DIAGNOSIS — C801 Malignant (primary) neoplasm, unspecified: Secondary | ICD-10-CM | POA: Diagnosis not present

## 2023-07-15 DIAGNOSIS — R930 Abnormal findings on diagnostic imaging of skull and head, not elsewhere classified: Secondary | ICD-10-CM | POA: Diagnosis not present

## 2023-07-15 LAB — CBC AND DIFFERENTIAL
HCT: 49 (ref 41–53)
Hemoglobin: 16.1 (ref 13.5–17.5)
Neutrophils Absolute: 9.09
Platelets: 207 10*3/uL (ref 150–400)
WBC: 12.8

## 2023-07-15 LAB — COMPREHENSIVE METABOLIC PANEL
Albumin: 4 (ref 3.5–5.0)
Calcium: 9 (ref 8.7–10.7)

## 2023-07-15 LAB — BASIC METABOLIC PANEL
BUN: 19 (ref 4–21)
CO2: 19 (ref 13–22)
Chloride: 106 (ref 99–108)
Creatinine: 0.7 (ref 0.6–1.3)
Glucose: 114
Potassium: 3.8 meq/L (ref 3.5–5.1)
Sodium: 134 — AB (ref 137–147)

## 2023-07-15 LAB — HEPATIC FUNCTION PANEL
ALT: 66 U/L — AB (ref 10–40)
AST: 30 (ref 14–40)
Alkaline Phosphatase: 77 (ref 25–125)
Bilirubin, Total: 1.2

## 2023-07-15 LAB — CBC: RBC: 5.56 — AB (ref 3.87–5.11)

## 2023-07-15 MED ORDER — GADOBUTROL 1 MMOL/ML IV SOLN
6.0000 mL | Freq: Once | INTRAVENOUS | Status: AC | PRN
  Administered 2023-07-15: 6 mL via INTRAVENOUS

## 2023-07-15 MED ORDER — HEPARIN SOD (PORK) LOCK FLUSH 100 UNIT/ML IV SOLN
500.0000 [IU] | INTRAVENOUS | Status: AC | PRN
  Administered 2023-07-15: 500 [IU]

## 2023-07-15 NOTE — Progress Notes (Signed)
[name], Spencer Butler  [address] [place],  [place]  [address] [phone]  Clinic Day:  07/16/2023  Referring physician: Paulina Fusi, MD  HISTORY OF PRESENT ILLNESS:  The patient is a 67 y.o. 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  He comes in today to go over his chest CT and brain MRI to ensure there remains no radiographic evidence of disease recurrence.  Since his last visit, the patient has been much weaker than previously.  Of note, he is on steroids for the possibility that his previous brain disease may be returning and causing associated swelling.  He denies having hemoptysis, coughing, or other respiratory symptoms which concern him for overt signs of disease recurrence.    With respect to his small cell lung cancer treatment history, he completed definitive chemoradiation, which consisted of 4 cycles of cisplatin/etoposide in October 2021.  Due to growth of a left large, cystic parietal lesion, he underwent whole brain radiation in January 2023.  After this lesion grew in April 2023, it was surgically drained, with an Ommaya reservoir placed in this location.  He received 4 doses of salvage stereotactic radiation in April 2023.  Interestingly enough, the fluid from this cystic mass did not show any malignant cells.  As serial brain MRIs showed slight progression of his brain lesion, he also received 3 cycles of Avastin, which were completed in May 2024.   PHYSICAL EXAM:  Blood pressure 117/83, pulse 77, temperature 98.7 F (37.1 C), resp. rate 16, height 5\' 7"  (1.702 m), SpO2 94%. Wt Readings from Last 3 Encounters:  05/21/23 141 lb 6.4 oz (64.1 kg)  04/27/23 139 lb 9.6 oz (63.3 kg)  03/16/23 144 lb 9.6 oz (65.6 kg)   Body mass index is 22.15 kg/m. Performance status (ECOG):  0 Physical Exam Constitutional:      General: He is not in acute distress.    Appearance: He is not  ill-appearing.     Comments: He does have a chronically ill appearance with a moon facies.   HENT:     Head: Normocephalic.     Nose: Nose normal.     Mouth/Throat:     Mouth: Mucous membranes are moist.     Pharynx: Oropharynx is clear.  Cardiovascular:     Rate and Rhythm: Normal rate and regular rhythm.     Heart sounds: No murmur heard.    No friction rub. No gallop.  Pulmonary:     Effort: Pulmonary effort is normal.     Breath sounds: Normal breath sounds.  Abdominal:     General: Bowel sounds are normal. There is no distension.     Palpations: Abdomen is soft.     Tenderness: There is no abdominal tenderness.  Musculoskeletal:     Cervical back: No rigidity.     Right lower leg: No edema.     Left lower leg: No edema.  Skin:    General: Skin is warm and dry.     Findings: No bruising, erythema or rash.  Neurological:     General: No focal deficit present.     Mental Status: He is oriented to person, place, and time.   SCANS:  His brain MRI and chest CT done yesterday revealed the following: FINDINGS: Brain: Right parietal periventricular, peripherally enhancing lesion measures 2.4 x 2.4 x 2.1 cm (AP x TR x CC) (series 17, image 86 and series 18, image  14), previously 2.7 x 2.6 x 2.1 cm when remeasured similarly.  Previously noted enhancing focus in the anterior right centrum semi of all is no longer seen. Previously noted clustered punctate foci of enhancement in the right occipital cortex now appears to be a single punctate focus (series 17, image 75).  Redemonstrated cyst in the left frontoparietal region appears overall unchanged, measuring up to 2.9 x 2.6 x 3.0 cm, previously 2.7 x 2.8 x 3.0 cm (series 17, image 115 and series 18, image 16). Small focal area of enhancement the superior aspect of also unchanged.  No new abnormal parenchymal or meningeal enhancement. No evidence of acute infarct, hemorrhage, mass effect, or midline shift. Unchanged trace  subdural collection along the anterior left frontal convexity. Confluent T2 hyperintense signal in the periventricular white matter and pons, likely the sequela of severe chronic small vessel ischemic disease.  Vascular: Loss of the left vertebral artery flow void (series 8, image 5), new from the prior exam, although this vessel does demonstrate enhancement, likely reflecting slow flow and atherosclerotic narrowing. Otherwise normal arterial flow voids. Normal arterial and venous enhancement.  Skull and upper cervical spine: Prior left parietal craniotomy. Otherwise normal marrow signal.  Sinuses/Orbits: Clear paranasal sinuses. No acute finding in the orbits.  Other: The mastoids are well aerated.  IMPRESSION: 1. Slight interval decrease in size of the right parietal periventricular lesion. 2. Previously noted enhancing focus in the anterior right centrum semiovale is no longer seen. 3. Previously noted clustered punctate foci of enhancement in the right occipital cortex now appears to be a single punctate focus. 4. Loss of the left vertebral artery flow void, new from the prior exam, although this vessel does demonstrate normal enhancement, likely reflecting slow flow and atherosclerotic narrowing. ------------------------------------------------------------------------------------------------ FINDINGS: Cardiovascular: Normal heart size. No pericardial effusion. Normal caliber thoracic aorta with mild atherosclerotic disease. Mild coronary artery calcifications. Right chest wall port with tip near the superior cavoatrial junction.  Mediastinum/Nodes: Wall thickening of the upper esophagus, decreased when compared with the prior exam and likely due to improving esophagitis. Thyroid is unremarkable. No enlarged lymph nodes seen in the chest. Similar soft tissue stranding of the left upper mediastinum.  Lungs/Pleura: Central airways are patent. Severe emphysema.  No consolidation, pleural effusion or pneumothorax. Stable left perihilar postradiation change. No new or enlarging pulmonary nodules. Stable mild right pleural thickening. No pleural effusion.  Upper Abdomen: No acute abnormality.  Musculoskeletal: Dextrocurvature of the thoracolumbar spine. No chest wall abnormality. No acute or significant osseous findings.  IMPRESSION: 1. No evidence of progressive disease in the chest. 2. Wall thickening of the upper esophagus, decreased when compared with the prior exam and likely due to improving esophagitis. 3. Aortic Atherosclerosis (ICD10-I70.0) and Emphysema (ICD10-J43.9).  ASSESSMENT & PLAN:  Assessment/Plan:  A 67 y.o. male with small cell lung cancer.  In clinic today, I went over his brain MRI and chest CT with him today, for which he could see there remains no evidence of disease recurrence or CNS progression.  Understandably, the patient was pleased with his images.  However, his health really has not improved, despite these encouraging scans.  I will have physical therapy re-evaluate this patient and work with him to hopefully regaining some of the strength and stamina he has lost.  I will defer to neuro-oncology with respect to whether his steroids remain necessary.  Otherwise, I will see this patient back in 4 months, with a chest x-ray being done on the day of his next  visit for his continued radiographic lung cancer surveillance.  The patient understands all the plans discussed today and is in agreement with them.    Bernadetta Roell Kirby Funk, MD

## 2023-07-16 ENCOUNTER — Other Ambulatory Visit: Payer: Self-pay | Admitting: Oncology

## 2023-07-16 ENCOUNTER — Inpatient Hospital Stay (HOSPITAL_BASED_OUTPATIENT_CLINIC_OR_DEPARTMENT_OTHER): Payer: Medicare HMO | Admitting: Internal Medicine

## 2023-07-16 ENCOUNTER — Inpatient Hospital Stay: Payer: Medicare HMO | Admitting: Oncology

## 2023-07-16 VITALS — BP 117/83 | HR 77 | Temp 98.7°F | Resp 16 | Ht 67.0 in

## 2023-07-16 DIAGNOSIS — C7931 Secondary malignant neoplasm of brain: Secondary | ICD-10-CM

## 2023-07-16 DIAGNOSIS — C3400 Malignant neoplasm of unspecified main bronchus: Secondary | ICD-10-CM | POA: Diagnosis not present

## 2023-07-16 DIAGNOSIS — C383 Malignant neoplasm of mediastinum, part unspecified: Secondary | ICD-10-CM

## 2023-07-16 NOTE — Progress Notes (Signed)
I connected with Spencer Butler on 07/16/23 at 11:30 AM EDT by telephone visit and verified that I am speaking with the correct person using two identifiers.  I discussed the limitations, risks, security and privacy concerns of performing an evaluation and management service by telemedicine and the availability of in-person appointments. I also discussed with the patient that there may be a patient responsible charge related to this service. The patient expressed understanding and agreed to proceed.   Other persons participating in the visit and their role in the encounter:  spouse   Patient's location:  Home Provider's location:  Office Chief Complaint:  Metastasis to brain [facility]) - Plan: MR BRAIN W WO CONTRAST  History of Present Ilness: Spencer Butler reports no clinical changes today.  The increased dose of decadron to 4mg  twice per day did not improve energy, appetite, balance issues discussed last week.  He saw Dr. Melvyn Neth today in [place] for lung cancer surveillance.  No headaches, seizures.  Observations: Language and cognition at baseline  Imaging:  [facility] Clinician Interpretation: I have personally reviewed the CNS images as listed.  My interpretation, in the context of the patient's clinical presentation, is stable disease  MR BRAIN W WO CONTRAST  Result Date: 07/16/2023 CLINICAL DATA:  Brain metastases, radiation therapy induced brain necrosis EXAM: MRI HEAD WITHOUT AND WITH CONTRAST TECHNIQUE: Multiplanar, multiecho pulse sequences of the brain and surrounding structures were obtained without and with intravenous contrast. CONTRAST:  6mL GADAVIST GADOBUTROL 1 MMOL/ML IV SOLN COMPARISON:  05/14/2023 FINDINGS: Brain: Right parietal periventricular, peripherally enhancing lesion measures 2.4 x 2.4 x 2.1 cm (AP x TR x CC) (series 17, image 86 and series 18, image 14), previously 2.7 x 2.6 x 2.1 cm when remeasured similarly. Previously noted enhancing focus in the anterior right centrum semi  of all is no longer seen. Previously noted clustered punctate foci of enhancement in the right occipital cortex now appears to be a single punctate focus (series 17, image 75). Redemonstrated cyst in the left frontoparietal region appears overall unchanged, measuring up to 2.9 x 2.6 x 3.0 cm, previously 2.7 x 2.8 x 3.0 cm (series 17, image 115 and series 18, image 16). Small focal area of enhancement the superior aspect of also unchanged. No new abnormal parenchymal or meningeal enhancement. No evidence of acute infarct, hemorrhage, mass effect, or midline shift. Unchanged trace subdural collection along the anterior left frontal convexity. Confluent T2 hyperintense signal in the periventricular white matter and pons, likely the sequela of severe chronic small vessel ischemic disease. Vascular: Loss of the left vertebral artery flow void (series 8, image 5), new from the prior exam, although this vessel does demonstrate enhancement, likely reflecting slow flow and atherosclerotic narrowing. Otherwise normal arterial flow voids. Normal arterial and venous enhancement. Skull and upper cervical spine: Prior left parietal craniotomy. Otherwise normal marrow signal. Sinuses/Orbits: Clear paranasal sinuses. No acute finding in the orbits. Other: The mastoids are well aerated. IMPRESSION: 1. Slight interval decrease in size of the right parietal periventricular lesion. 2. Previously noted enhancing focus in the anterior right centrum semiovale is no longer seen. 3. Previously noted clustered punctate foci of enhancement in the right occipital cortex now appears to be a single punctate focus. 4. Loss of the left vertebral artery flow void, new from the prior exam, although this vessel does demonstrate normal enhancement, likely reflecting slow flow and atherosclerotic narrowing. Electronically Signed   By: Wiliam Ke M.D.   On: 07/16/2023 02:07    Assessment  and Plan: Metastasis to brain [facility]) - Plan: MR BRAIN W WO  CONTRAST  Clinically and radiographically stable today.  Recommended repeating MRI brain in 4 months.  Decadron should be decreased to 4mg  daily, and further to 2mg  daily in 1-2 weeks if tolerated.  Follow Up Instructions: We ask that Spencer Butler return to clinic in 4 months following next brain MRI, or sooner as needed.  I discussed the assessment and treatment plan with the patient.  The patient was provided an opportunity to ask questions and all were answered.  The patient agreed with the plan and demonstrated understanding of the instructions.    The patient was advised to call back or seek an in-person evaluation if the symptoms worsen or if the condition fails to improve as anticipated.    [name], MD   I provided 22 minutes of non face-to-face telephone visit time during this encounter, and > 50% was spent counseling as documented under my assessment & plan.

## 2023-07-17 ENCOUNTER — Other Ambulatory Visit: Payer: Self-pay

## 2023-07-17 ENCOUNTER — Telehealth: Payer: Self-pay | Admitting: Oncology

## 2023-07-17 NOTE — Telephone Encounter (Signed)
Patient has been scheduled. Aware of appt date and time.   Scheduling Message Entered by Rennis Harding A on 07/16/2023 at  5:44 PM Priority: Routine <No visit type provided>  Department: [facility]  Provider:  Scheduling Notes:  Labs/cxr/appt 11-16-23

## 2023-07-18 ENCOUNTER — Other Ambulatory Visit: Payer: Self-pay

## 2023-07-22 ENCOUNTER — Encounter: Payer: Self-pay | Admitting: Internal Medicine

## 2023-07-23 ENCOUNTER — Other Ambulatory Visit: Payer: Self-pay | Admitting: Internal Medicine

## 2023-07-24 ENCOUNTER — Other Ambulatory Visit: Payer: Self-pay | Admitting: Internal Medicine

## 2023-07-28 DIAGNOSIS — G9389 Other specified disorders of brain: Secondary | ICD-10-CM | POA: Diagnosis not present

## 2023-07-28 DIAGNOSIS — C3400 Malignant neoplasm of unspecified main bronchus: Secondary | ICD-10-CM | POA: Diagnosis not present

## 2023-07-30 DIAGNOSIS — G9389 Other specified disorders of brain: Secondary | ICD-10-CM | POA: Diagnosis not present

## 2023-07-30 DIAGNOSIS — C3400 Malignant neoplasm of unspecified main bronchus: Secondary | ICD-10-CM | POA: Diagnosis not present

## 2023-08-03 DIAGNOSIS — G9389 Other specified disorders of brain: Secondary | ICD-10-CM | POA: Diagnosis not present

## 2023-08-03 DIAGNOSIS — C3400 Malignant neoplasm of unspecified main bronchus: Secondary | ICD-10-CM | POA: Diagnosis not present

## 2023-08-04 ENCOUNTER — Encounter: Payer: Self-pay | Admitting: Oncology

## 2023-08-06 DIAGNOSIS — G9389 Other specified disorders of brain: Secondary | ICD-10-CM | POA: Diagnosis not present

## 2023-08-06 DIAGNOSIS — C3400 Malignant neoplasm of unspecified main bronchus: Secondary | ICD-10-CM | POA: Diagnosis not present

## 2023-08-10 DIAGNOSIS — C3400 Malignant neoplasm of unspecified main bronchus: Secondary | ICD-10-CM | POA: Diagnosis not present

## 2023-08-10 DIAGNOSIS — G9389 Other specified disorders of brain: Secondary | ICD-10-CM | POA: Diagnosis not present

## 2023-08-11 DIAGNOSIS — C3400 Malignant neoplasm of unspecified main bronchus: Secondary | ICD-10-CM | POA: Diagnosis not present

## 2023-08-11 DIAGNOSIS — G9389 Other specified disorders of brain: Secondary | ICD-10-CM | POA: Diagnosis not present

## 2023-08-13 DIAGNOSIS — G9389 Other specified disorders of brain: Secondary | ICD-10-CM | POA: Diagnosis not present

## 2023-08-13 DIAGNOSIS — C3400 Malignant neoplasm of unspecified main bronchus: Secondary | ICD-10-CM | POA: Diagnosis not present

## 2023-08-17 DIAGNOSIS — C3400 Malignant neoplasm of unspecified main bronchus: Secondary | ICD-10-CM | POA: Diagnosis not present

## 2023-08-17 DIAGNOSIS — G9389 Other specified disorders of brain: Secondary | ICD-10-CM | POA: Diagnosis not present

## 2023-08-20 DIAGNOSIS — G9389 Other specified disorders of brain: Secondary | ICD-10-CM | POA: Diagnosis not present

## 2023-08-20 DIAGNOSIS — C3400 Malignant neoplasm of unspecified main bronchus: Secondary | ICD-10-CM | POA: Diagnosis not present

## 2023-08-24 DIAGNOSIS — G9389 Other specified disorders of brain: Secondary | ICD-10-CM | POA: Diagnosis not present

## 2023-08-24 DIAGNOSIS — C3400 Malignant neoplasm of unspecified main bronchus: Secondary | ICD-10-CM | POA: Diagnosis not present

## 2023-08-25 DIAGNOSIS — G9389 Other specified disorders of brain: Secondary | ICD-10-CM | POA: Diagnosis not present

## 2023-08-25 DIAGNOSIS — C3400 Malignant neoplasm of unspecified main bronchus: Secondary | ICD-10-CM | POA: Diagnosis not present

## 2023-08-31 DIAGNOSIS — C3400 Malignant neoplasm of unspecified main bronchus: Secondary | ICD-10-CM | POA: Diagnosis not present

## 2023-08-31 DIAGNOSIS — G9389 Other specified disorders of brain: Secondary | ICD-10-CM | POA: Diagnosis not present

## 2023-09-03 ENCOUNTER — Inpatient Hospital Stay: Admission: RE | Admit: 2023-09-03 | Payer: Medicare HMO | Source: Ambulatory Visit

## 2023-09-07 DIAGNOSIS — C3400 Malignant neoplasm of unspecified main bronchus: Secondary | ICD-10-CM | POA: Diagnosis not present

## 2023-09-07 DIAGNOSIS — G9389 Other specified disorders of brain: Secondary | ICD-10-CM | POA: Diagnosis not present

## 2023-09-08 ENCOUNTER — Other Ambulatory Visit: Payer: Self-pay | Admitting: Internal Medicine

## 2023-09-08 DIAGNOSIS — C3400 Malignant neoplasm of unspecified main bronchus: Secondary | ICD-10-CM | POA: Diagnosis not present

## 2023-09-08 DIAGNOSIS — G9389 Other specified disorders of brain: Secondary | ICD-10-CM | POA: Diagnosis not present

## 2023-09-09 MED ORDER — DEXAMETHASONE 2 MG PO TABS: 2.0000 mg | ORAL_TABLET | Freq: Every day | ORAL | 2 refills | Status: DC

## 2023-09-09 NOTE — Telephone Encounter (Signed)
Received refill request for Decadron. After reviewing Dr. Liana Gerold last OV note, it's noted that pt should be taking 2 mg daily. TC to pt and spoke w/ his wife, who reports pt has been taking 2 mg of Decadron daily by cutting a 4 mg tab in half. Advised that refill will be submitted for Decadron 2 mg daily. Pt to f/u with Dr. Barbaraann Cao after brain MRI in February.

## 2023-09-10 ENCOUNTER — Ambulatory Visit: Payer: Medicare HMO | Admitting: Internal Medicine

## 2023-09-10 DIAGNOSIS — C3400 Malignant neoplasm of unspecified main bronchus: Secondary | ICD-10-CM | POA: Diagnosis not present

## 2023-09-10 DIAGNOSIS — G9389 Other specified disorders of brain: Secondary | ICD-10-CM | POA: Diagnosis not present

## 2023-09-14 DIAGNOSIS — G9389 Other specified disorders of brain: Secondary | ICD-10-CM | POA: Diagnosis not present

## 2023-09-14 DIAGNOSIS — C3400 Malignant neoplasm of unspecified main bronchus: Secondary | ICD-10-CM | POA: Diagnosis not present

## 2023-09-16 DIAGNOSIS — G9389 Other specified disorders of brain: Secondary | ICD-10-CM | POA: Diagnosis not present

## 2023-09-16 DIAGNOSIS — C3400 Malignant neoplasm of unspecified main bronchus: Secondary | ICD-10-CM | POA: Diagnosis not present

## 2023-09-19 ENCOUNTER — Other Ambulatory Visit: Payer: Self-pay | Admitting: Internal Medicine

## 2023-09-21 ENCOUNTER — Encounter: Payer: Self-pay | Admitting: Internal Medicine

## 2023-10-08 ENCOUNTER — Other Ambulatory Visit: Payer: Self-pay | Admitting: Internal Medicine

## 2023-10-14 ENCOUNTER — Other Ambulatory Visit: Payer: Self-pay | Admitting: Oncology

## 2023-10-22 ENCOUNTER — Other Ambulatory Visit: Payer: Self-pay | Admitting: Oncology

## 2023-10-22 ENCOUNTER — Other Ambulatory Visit: Payer: Self-pay

## 2023-10-22 DIAGNOSIS — Z0000 Encounter for general adult medical examination without abnormal findings: Secondary | ICD-10-CM | POA: Diagnosis not present

## 2023-10-22 DIAGNOSIS — Z9181 History of falling: Secondary | ICD-10-CM | POA: Diagnosis not present

## 2023-10-22 MED ORDER — ONDANSETRON HCL 4 MG PO TABS: 4.0000 mg | ORAL_TABLET | ORAL | 2 refills | Status: DC | PRN

## 2023-10-26 ENCOUNTER — Encounter: Payer: Self-pay | Admitting: Internal Medicine

## 2023-10-28 ENCOUNTER — Inpatient Hospital Stay (HOSPITAL_COMMUNITY): Payer: Medicare HMO

## 2023-10-28 ENCOUNTER — Other Ambulatory Visit: Payer: Self-pay

## 2023-10-28 ENCOUNTER — Emergency Department (HOSPITAL_COMMUNITY): Payer: Medicare HMO

## 2023-10-28 ENCOUNTER — Inpatient Hospital Stay (HOSPITAL_COMMUNITY)
Admission: EM | Admit: 2023-10-28 | Discharge: 2023-10-31 | DRG: 378 | Disposition: E | Payer: Medicare HMO | Attending: Pulmonary Disease | Admitting: Pulmonary Disease

## 2023-10-28 DIAGNOSIS — Z79899 Other long term (current) drug therapy: Secondary | ICD-10-CM

## 2023-10-28 DIAGNOSIS — K922 Gastrointestinal hemorrhage, unspecified: Principal | ICD-10-CM | POA: Diagnosis present

## 2023-10-28 DIAGNOSIS — R578 Other shock: Secondary | ICD-10-CM | POA: Diagnosis present

## 2023-10-28 DIAGNOSIS — D630 Anemia in neoplastic disease: Secondary | ICD-10-CM | POA: Diagnosis not present

## 2023-10-28 DIAGNOSIS — R571 Hypovolemic shock: Secondary | ICD-10-CM | POA: Diagnosis not present

## 2023-10-28 DIAGNOSIS — Z6823 Body mass index (BMI) 23.0-23.9, adult: Secondary | ICD-10-CM | POA: Diagnosis not present

## 2023-10-28 DIAGNOSIS — R159 Full incontinence of feces: Secondary | ICD-10-CM | POA: Diagnosis present

## 2023-10-28 DIAGNOSIS — Z66 Do not resuscitate: Secondary | ICD-10-CM | POA: Diagnosis not present

## 2023-10-28 DIAGNOSIS — E875 Hyperkalemia: Secondary | ICD-10-CM | POA: Diagnosis present

## 2023-10-28 DIAGNOSIS — K921 Melena: Secondary | ICD-10-CM | POA: Diagnosis not present

## 2023-10-28 DIAGNOSIS — R918 Other nonspecific abnormal finding of lung field: Secondary | ICD-10-CM | POA: Diagnosis not present

## 2023-10-28 DIAGNOSIS — D62 Acute posthemorrhagic anemia: Secondary | ICD-10-CM | POA: Diagnosis present

## 2023-10-28 DIAGNOSIS — Z85118 Personal history of other malignant neoplasm of bronchus and lung: Secondary | ICD-10-CM | POA: Diagnosis not present

## 2023-10-28 DIAGNOSIS — R935 Abnormal findings on diagnostic imaging of other abdominal regions, including retroperitoneum: Secondary | ICD-10-CM | POA: Diagnosis not present

## 2023-10-28 DIAGNOSIS — C7931 Secondary malignant neoplasm of brain: Secondary | ICD-10-CM | POA: Diagnosis not present

## 2023-10-28 DIAGNOSIS — I7143 Infrarenal abdominal aortic aneurysm, without rupture: Secondary | ICD-10-CM | POA: Diagnosis not present

## 2023-10-28 DIAGNOSIS — R9431 Abnormal electrocardiogram [ECG] [EKG]: Secondary | ICD-10-CM | POA: Diagnosis not present

## 2023-10-28 DIAGNOSIS — F17210 Nicotine dependence, cigarettes, uncomplicated: Secondary | ICD-10-CM | POA: Diagnosis present

## 2023-10-28 DIAGNOSIS — C383 Malignant neoplasm of mediastinum, part unspecified: Secondary | ICD-10-CM | POA: Diagnosis present

## 2023-10-28 DIAGNOSIS — K5730 Diverticulosis of large intestine without perforation or abscess without bleeding: Secondary | ICD-10-CM | POA: Diagnosis not present

## 2023-10-28 DIAGNOSIS — R54 Age-related physical debility: Secondary | ICD-10-CM | POA: Diagnosis present

## 2023-10-28 DIAGNOSIS — E669 Obesity, unspecified: Secondary | ICD-10-CM | POA: Diagnosis present

## 2023-10-28 DIAGNOSIS — C3490 Malignant neoplasm of unspecified part of unspecified bronchus or lung: Secondary | ICD-10-CM | POA: Diagnosis present

## 2023-10-28 DIAGNOSIS — R000 Tachycardia, unspecified: Secondary | ICD-10-CM | POA: Diagnosis not present

## 2023-10-28 DIAGNOSIS — R531 Weakness: Secondary | ICD-10-CM | POA: Diagnosis not present

## 2023-10-28 DIAGNOSIS — R0989 Other specified symptoms and signs involving the circulatory and respiratory systems: Secondary | ICD-10-CM | POA: Diagnosis not present

## 2023-10-28 DIAGNOSIS — R58 Hemorrhage, not elsewhere classified: Secondary | ICD-10-CM | POA: Diagnosis not present

## 2023-10-28 LAB — COMPREHENSIVE METABOLIC PANEL
ALT: 10 U/L (ref 0–44)
AST: 18 U/L (ref 15–41)
Albumin: 2 g/dL — ABNORMAL LOW (ref 3.5–5.0)
Alkaline Phosphatase: 52 U/L (ref 38–126)
Anion gap: 22 — ABNORMAL HIGH (ref 5–15)
BUN: 11 mg/dL (ref 8–23)
CO2: 13 mmol/L — ABNORMAL LOW (ref 22–32)
Calcium: 6.5 mg/dL — ABNORMAL LOW (ref 8.9–10.3)
Chloride: 104 mmol/L (ref 98–111)
Creatinine, Ser: 0.92 mg/dL (ref 0.61–1.24)
GFR, Estimated: >60 mL/min (ref >60)
Glucose, Bld: 275 mg/dL — ABNORMAL HIGH (ref 70–99)
Potassium: >7.5 mmol/L (ref 3.5–5.1)
Sodium: 139 mmol/L (ref 135–145)
Total Bilirubin: 0.9 mg/dL (ref 0.0–1.2)
Total Protein: 3.8 g/dL — ABNORMAL LOW (ref 6.5–8.1)

## 2023-10-28 LAB — HEMOGLOBIN AND HEMATOCRIT, BLOOD
HCT: 35.7 % — ABNORMAL LOW (ref 39.0–52.0)
Hemoglobin: 11.8 g/dL — ABNORMAL LOW (ref 13.0–17.0)

## 2023-10-28 LAB — I-STAT VENOUS BLOOD GAS, ED
Acid-base deficit: 18 mmol/L — ABNORMAL HIGH (ref 0.0–2.0)
Bicarbonate: 14.1 mmol/L — ABNORMAL LOW (ref 20.0–28.0)
Calcium, Ion: 0.31 mmol/L — CL (ref 1.15–1.40)
HCT: 45 % (ref 39.0–52.0)
Hemoglobin: 15.3 g/dL (ref 13.0–17.0)
O2 Saturation: 58 %
Potassium: >8.5 mmol/L (ref 3.5–5.1)
Sodium: 135 mmol/L (ref 135–145)
TCO2: 16 mmol/L — ABNORMAL LOW (ref 22–32)
pCO2, Ven: 56.2 mm[Hg] (ref 44–60)
pH, Ven: 7.006 — CL (ref 7.25–7.43)
pO2, Ven: 46 mm[Hg] — ABNORMAL HIGH (ref 32–45)

## 2023-10-28 LAB — DIC (DISSEMINATED INTRAVASCULAR COAGULATION)PANEL
D-Dimer, Quant: 2.15 ug{FEU}/mL — ABNORMAL HIGH (ref 0.00–0.50)
Fibrinogen: 244 mg/dL (ref 210–475)
INR: 1.2 (ref 0.8–1.2)
Platelets: 178 10*3/uL (ref 150–400)
Prothrombin Time: 15.6 s — ABNORMAL HIGH (ref 11.4–15.2)
Smear Review: NONE SEEN
aPTT: 37 s — ABNORMAL HIGH (ref 24–36)

## 2023-10-28 LAB — PROTIME-INR
INR: 1.2 (ref 0.8–1.2)
Prothrombin Time: 15.7 s — ABNORMAL HIGH (ref 11.4–15.2)

## 2023-10-28 LAB — MASSIVE TRANSFUSION PROTOCOL ORDER (BLOOD BANK NOTIFICATION)

## 2023-10-28 LAB — CBC
HCT: 48.3 % (ref 39.0–52.0)
Hemoglobin: 15 g/dL (ref 13.0–17.0)
MCH: 27.5 pg (ref 26.0–34.0)
MCHC: 31.1 g/dL (ref 30.0–36.0)
MCV: 88.5 fL (ref 80.0–100.0)
Platelets: 173 10*3/uL (ref 150–400)
RBC: 5.46 MIL/uL (ref 4.22–5.81)
RDW: 16.6 % — ABNORMAL HIGH (ref 11.5–15.5)
WBC: 6.5 10*3/uL (ref 4.0–10.5)
nRBC: 1.7 % — ABNORMAL HIGH (ref 0.0–0.2)

## 2023-10-28 LAB — PREPARE RBC (CROSSMATCH)

## 2023-10-28 SURGERY — ESOPHAGOGASTRODUODENOSCOPY (EGD) WITH PROPOFOL
Anesthesia: Monitor Anesthesia Care

## 2023-10-28 MED ORDER — CALCIUM GLUCONATE-NACL 1-0.675 GM/50ML-% IV SOLN
1.0000 g | Freq: Once | INTRAVENOUS | Status: AC
  Administered 2023-10-28: 1000 mg via INTRAVENOUS

## 2023-10-28 MED ORDER — SODIUM CHLORIDE 0.9 % IV SOLN
1.0000 g | Freq: Once | INTRAVENOUS | Status: DC
  Filled 2023-10-28: qty 10

## 2023-10-28 MED ORDER — CALCIUM GLUCONATE-NACL 1-0.675 GM/50ML-% IV SOLN
2.0000 g | Freq: Once | INTRAVENOUS | Status: AC
  Administered 2023-10-28: 2000 mg via INTRAVENOUS

## 2023-10-28 MED ORDER — PANTOPRAZOLE SODIUM 40 MG IV SOLR
40.0000 mg | INTRAVENOUS | Status: AC
  Administered 2023-10-28: 40 mg via INTRAVENOUS

## 2023-10-28 MED ORDER — NOREPINEPHRINE 4 MG/250ML-% IV SOLN: 0.0000 ug/min | INTRAVENOUS | Status: DC

## 2023-10-28 MED ORDER — SODIUM CHLORIDE 0.9 % IV SOLN: INTRAVENOUS | Status: DC

## 2023-10-28 MED ORDER — SODIUM CHLORIDE 0.9 % IV BOLUS (SEPSIS)
500.0000 mL | Freq: Once | INTRAVENOUS | Status: AC
  Administered 2023-10-28: 500 mL via INTRAVENOUS

## 2023-10-28 MED ORDER — INSULIN ASPART 100 UNIT/ML IV SOLN: 10.0000 [IU] | Freq: Once | INTRAVENOUS | Status: DC

## 2023-10-28 MED ORDER — PANTOPRAZOLE SODIUM 40 MG IV SOLR: 40.0000 mg | Freq: Two times a day (BID) | INTRAVENOUS | Status: DC

## 2023-10-28 MED ORDER — POLYETHYLENE GLYCOL 3350 17 G PO PACK: 17.0000 g | PACK | Freq: Every day | ORAL | Status: DC | PRN

## 2023-10-28 MED ORDER — METHYLPREDNISOLONE SODIUM SUCC 125 MG IJ SOLR
125.0000 mg | Freq: Once | INTRAMUSCULAR | Status: AC
Start: 2023-10-28 — End: 2023-10-28
  Administered 2023-10-28: 125 mg via INTRAVENOUS
  Filled 2023-10-28: qty 2

## 2023-10-28 MED ORDER — HYDROCORTISONE SOD SUC (PF) 100 MG IJ SOLR: 100.0000 mg | Freq: Three times a day (TID) | INTRAMUSCULAR | Status: DC

## 2023-10-28 MED ORDER — ONDANSETRON HCL 4 MG/2ML IJ SOLN
4.0000 mg | Freq: Once | INTRAMUSCULAR | Status: AC
  Administered 2023-10-28: 4 mg via INTRAVENOUS

## 2023-10-28 MED ORDER — TRANEXAMIC ACID-NACL 1000-0.7 MG/100ML-% IV SOLN
1000.0000 mg | Freq: Once | INTRAVENOUS | Status: AC
  Administered 2023-10-28: 1000 mg via INTRAVENOUS

## 2023-10-28 MED ORDER — DEXTROSE 50 % IV SOLN: 25.0000 g | Freq: Once | INTRAVENOUS | Status: DC

## 2023-10-28 MED ORDER — DOCUSATE SODIUM 100 MG PO CAPS: 100.0000 mg | ORAL_CAPSULE | Freq: Two times a day (BID) | ORAL | Status: DC | PRN

## 2023-10-28 MED ORDER — SODIUM CHLORIDE 0.9% IV SOLUTION: Freq: Once | INTRAVENOUS | Status: DC

## 2023-10-28 MED ORDER — PANTOPRAZOLE SODIUM 40 MG IV SOLR: 40.0000 mg | Freq: Once | INTRAVENOUS | Status: DC

## 2023-10-28 MED ORDER — IOHEXOL 350 MG/ML SOLN
75.0000 mL | Freq: Once | INTRAVENOUS | Status: AC | PRN
  Administered 2023-10-28: 75 mL via INTRAVENOUS

## 2023-10-29 LAB — PREPARE FRESH FROZEN PLASMA
Unit division: 0
Unit division: 0
Unit division: 0
Unit division: 0
Unit division: 0
Unit division: 0
Unit division: 0
Unit division: 0
Unit division: 0
Unit division: 0
Unit division: 0
Unit division: 0
Unit division: 0
Unit division: 0
Unit division: 0
Unit division: 0
Unit division: 0

## 2023-10-29 LAB — BPAM RBC
Blood Product Expiration Date: 202502112359
Blood Product Expiration Date: 202502112359
Blood Product Expiration Date: 202502112359
Blood Product Expiration Date: 202502112359
Blood Product Expiration Date: 202502122359
Blood Product Expiration Date: 202502152359
Blood Product Expiration Date: 202502152359
Blood Product Expiration Date: 202502242359
Blood Product Expiration Date: 202502242359
Blood Product Expiration Date: 202502242359
Blood Product Expiration Date: 202502242359
Blood Product Expiration Date: 202502242359
Blood Product Expiration Date: 202502242359
Blood Product Expiration Date: 202502242359
Blood Product Expiration Date: 202502242359
Blood Product Expiration Date: 202502242359
Blood Product Expiration Date: 202502242359
Blood Product Expiration Date: 202502242359
Blood Product Expiration Date: 202502242359
Blood Product Expiration Date: 202502262359
Blood Product Expiration Date: 202502262359
Blood Product Expiration Date: 202502262359
ISSUE DATE / TIME: 202501290541
ISSUE DATE / TIME: 202501290541
ISSUE DATE / TIME: 202501290631
ISSUE DATE / TIME: 202501290631
ISSUE DATE / TIME: 202501290631
ISSUE DATE / TIME: 202501290631
ISSUE DATE / TIME: 202501290631
ISSUE DATE / TIME: 202501290631
ISSUE DATE / TIME: 202501290631
ISSUE DATE / TIME: 202501290631
ISSUE DATE / TIME: 202501290633
ISSUE DATE / TIME: 202501290633
ISSUE DATE / TIME: 202501290633
ISSUE DATE / TIME: 202501290633
ISSUE DATE / TIME: 202501290751
ISSUE DATE / TIME: 202501290751
ISSUE DATE / TIME: 202501290751
ISSUE DATE / TIME: 202501290751
ISSUE DATE / TIME: 202501290839
ISSUE DATE / TIME: 202501290839
ISSUE DATE / TIME: 202501291323
ISSUE DATE / TIME: 202501291506
Unit Type and Rh: 5100
Unit Type and Rh: 5100
Unit Type and Rh: 5100
Unit Type and Rh: 5100
Unit Type and Rh: 5100
Unit Type and Rh: 5100
Unit Type and Rh: 5100
Unit Type and Rh: 5100
Unit Type and Rh: 5100
Unit Type and Rh: 5100
Unit Type and Rh: 5100
Unit Type and Rh: 5100
Unit Type and Rh: 5100
Unit Type and Rh: 5100
Unit Type and Rh: 5100
Unit Type and Rh: 5100
Unit Type and Rh: 5100
Unit Type and Rh: 5100
Unit Type and Rh: 5100
Unit Type and Rh: 5100
Unit Type and Rh: 5100
Unit Type and Rh: 5100

## 2023-10-29 LAB — BPAM FFP
Blood Product Expiration Date: 202502012359
Blood Product Expiration Date: 202502012359
Blood Product Expiration Date: 202502032359
Blood Product Expiration Date: 202502032359
Blood Product Expiration Date: 202502032359
Blood Product Expiration Date: 202502032359
Blood Product Expiration Date: 202502032359
Blood Product Expiration Date: 202502032359
Blood Product Expiration Date: 202502032359
Blood Product Expiration Date: 202502032359
Blood Product Expiration Date: 202502032359
Blood Product Expiration Date: 202502032359
Blood Product Expiration Date: 202502032359
Blood Product Expiration Date: 202502042359
Blood Product Expiration Date: 202502042359
Blood Product Expiration Date: 202502102359
Blood Product Expiration Date: 202502102359
Blood Product Expiration Date: 202502102359
Blood Product Expiration Date: 202502102359
Blood Product Expiration Date: 202502102359
ISSUE DATE / TIME: 202501290631
ISSUE DATE / TIME: 202501290631
ISSUE DATE / TIME: 202501290631
ISSUE DATE / TIME: 202501290631
ISSUE DATE / TIME: 202501290631
ISSUE DATE / TIME: 202501290631
ISSUE DATE / TIME: 202501290631
ISSUE DATE / TIME: 202501290631
ISSUE DATE / TIME: 202501290640
ISSUE DATE / TIME: 202501290640
ISSUE DATE / TIME: 202501290640
ISSUE DATE / TIME: 202501290645
ISSUE DATE / TIME: 202501290752
ISSUE DATE / TIME: 202501290752
ISSUE DATE / TIME: 202501290752
ISSUE DATE / TIME: 202501290752
ISSUE DATE / TIME: 202501290842
ISSUE DATE / TIME: 202501290842
ISSUE DATE / TIME: 202501291346
ISSUE DATE / TIME: 202501291346
Unit Type and Rh: 5100
Unit Type and Rh: 5100
Unit Type and Rh: 5100
Unit Type and Rh: 600
Unit Type and Rh: 600
Unit Type and Rh: 600
Unit Type and Rh: 6200
Unit Type and Rh: 6200
Unit Type and Rh: 6200
Unit Type and Rh: 6200
Unit Type and Rh: 6200
Unit Type and Rh: 6200
Unit Type and Rh: 6200
Unit Type and Rh: 6200
Unit Type and Rh: 6200
Unit Type and Rh: 6200
Unit Type and Rh: 6200
Unit Type and Rh: 6200
Unit Type and Rh: 6200
Unit Type and Rh: 9500

## 2023-10-29 LAB — BPAM PLATELET PHERESIS
Blood Product Expiration Date: 202501302359
Blood Product Expiration Date: 202501302359
Blood Product Unit Number: 202501312359
Blood Product Unit Number: 202501312359
ISSUE DATE / TIME: 202501290626
ISSUE DATE / TIME: 202501292124
ISSUE DATE / TIME: 202501292245
ISSUING PHYSICIAN: 202501290757
ISSUING PHYSICIAN: 202501302359
Unit Type and Rh: 202501300436
Unit Type and Rh: 202501312359
Unit Type and Rh: 202501312359
Unit Type and Rh: 5100
Unit Type and Rh: 6200
Unit Type and Rh: 6200
Unit Type and Rh: 7300

## 2023-10-29 LAB — PREPARE PLATELET PHERESIS
Unit division: 0
Unit division: 0
Unit division: 0
Unit division: 0

## 2023-10-29 LAB — TYPE AND SCREEN
ABO/RH(D): O POS
Antibody Screen: NEGATIVE
Unit division: 0
Unit division: 0
Unit division: 0
Unit division: 0
Unit division: 0
Unit division: 0
Unit division: 0
Unit division: 0
Unit division: 0
Unit division: 0
Unit division: 0
Unit division: 0
Unit division: 0
Unit division: 0
Unit division: 0
Unit division: 0
Unit division: 0
Unit division: 0
Unit division: 0
Unit division: 0
Unit division: 0
Unit division: 0

## 2023-10-29 LAB — PREPARE CRYOPRECIPITATE
Unit division: 0
Unit division: 0

## 2023-10-29 LAB — BPAM CRYOPRECIPITATE
Blood Product Expiration Date: 202502012359
Blood Product Expiration Date: 202502032359
ISSUE DATE / TIME: 202501290637
Unit Type and Rh: 202502032359
Unit Type and Rh: 5100
Unit Type and Rh: 9500

## 2023-10-31 NOTE — Death Summary Note (Addendum)
DEATH SUMMARY   Patient Details  Name: Spencer Butler MRN: [id number] DOB: [birth year] WUJ:[id number], Spencer Speak, MD  Admission/Discharge Information   Admit Date:  11/07/23  Date of Death: Date of Death: November 07, 2023  Time of Death: Time of Death: 0856  Length of Stay: 1   Principle Cause of death: hemorrhage  Hospital Diagnoses: Principal Problem:   GI bleed Active Problems:   Malignant neoplasm of mediastinum, part unspecified (HCC)   Small cell lung cancer (HCC)   Metastasis to brain [facility])   Hemorrhagic shock [facility])   Hospital Course: This 68 year old male with stage IV squamous cell carcinoma of the lung presented to [facility] ER with overt GI bleed. The patient's wife reported that the patient had been incontinent of bloody stool. The patient has been on Decadron treatment for symptomatic therapy related to brain metastases. He arrived in the ER hypotensive. ER initiation massive transfusion protocol. PCCM was called to admit. The patient received 8 unitsPRBC, 8 units FFP, and 1 unit platelets in addition to 1 L of normal saline IV fluid bolus.  He was started on norepinephrine for blood pressure support.  Laboratory evaluation revealed anemia, hyperkalemia, hypocalcemia.  Anemia in the setting of ongoing GI bleed was addressed with massive transfusion protocol.  Hyperkalemia and hypocalcemia were treated with insulin/D50 and calcium chloride.  Hypotension was addressed with norepinephrine infusion, IV fluid administration and transfusions.  Of note, the patient's wife did stipulate that the patient's CODE STATUS should be changed to DO NOT RESUSCITATE. Hemodynamic instability precluded aggressive diagnostic evaluation to definitively diagnose the location of blood loss.  Given his incontinence of blood reported on arrival, GI bleed was presumed.  Given his treatment with Decadron (for brain metastases), Protonix infusion was ordered.  The patient was transferred from ER to 24M ICU but expired  shortly after transfer: TIME OF DEATH 0856.  Case was reviewed with the Medical Examiner Marisue Ivan de la [place]), who declined the case for further investigation.  Assessment and Plan: Principal Problem:   GI bleed Active Problems:   Malignant neoplasm of mediastinum, part unspecified (HCC)   Small cell lung cancer (HCC)   Metastasis to brain (HCC)   Hemorrhagic shock (HCC)   Acute blood loss anemia  Plan delineated above in HOSPITAL COURSE.   Procedures: Central venous catheter insertion was performed in the ER Nov 07, 2023).  Consultations: Gastroenterology  The results of significant diagnostics from this hospitalization (including imaging, microbiology, ancillary and laboratory) are listed below for reference.   Significant Diagnostic Studies: DG Chest Port 1 View Result Date: 07-Nov-2023 CLINICAL DATA:  GI bleed, history of lung cancer EXAM: PORTABLE CHEST 1 VIEW COMPARISON:  Prior chest x-ray 03/16/2023 FINDINGS: External defibrillator pads project over the chest. Right subclavian approach single-lumen port catheter in good position with the tip at the superior cavoatrial junction. Very low inspiratory volumes with crowding of the pulmonary vasculature. Mild pulmonary vascular congestion. Stable cardiomegaly. No large effusion or pneumothorax. IMPRESSION: 1. Low inspiratory volumes with probable mild pulmonary edema. 2. External defibrillator pads project over the chest. 3. No large effusion or pneumothorax. 4. Well-positioned right subclavian approach power injectable port catheter. Electronically Signed   By: Malachy Moan M.D.   On: Nov 07, 2023 08:33   CT ANGIO GI BLEED Result Date: 2023-11-07 CLINICAL DATA:  68 year old male with history of brisk lower GI bleed with hypotension. EXAM: CTA ABDOMEN AND PELVIS WITHOUT AND WITH CONTRAST TECHNIQUE: Multidetector CT imaging of the abdomen and pelvis was performed using the standard  protocol during bolus administration of intravenous contrast.  Multiplanar reconstructed images and MIPs were obtained and reviewed to evaluate the vascular anatomy. RADIATION DOSE REDUCTION: This exam was performed according to the departmental dose-optimization program which includes automated exposure control, adjustment of the mA and/or kV according to patient size and/or use of iterative reconstruction technique. CONTRAST:  75mL OMNIPAQUE IOHEXOL 350 MG/ML SOLN COMPARISON:  None Available. FINDINGS: VASCULAR Aorta: Extensive aortic atherosclerosis with fusiform aneurysmal dilatation of the infrarenal abdominal aorta which extends over a length of approximately 7.3 cm cranio caudally and measures up to 3.6 x 3.6 cm. Large amount of nonenhancing material peripherally throughout the aneurysm sac which likely represents a combination of atheromatous plaque and/or mural thrombus. No evidence of abdominal aortic dissection. Celiac: Patent without evidence of aneurysm, dissection, vasculitis or significant stenosis. SMA: Patent without evidence of aneurysm, dissection, vasculitis or significant stenosis. Renals: Both renal arteries are patent without evidence of aneurysm, dissection, vasculitis, fibromuscular dysplasia or significant stenosis. IMA: Ostium is poorly visualized (ostial patency can not be confirmed), however, there does appear to be flow in the more distal IMA and branches, presumably from collateral pathways. Inflow: Patent without evidence of aneurysm, dissection, vasculitis or significant stenosis. Proximal Outflow: Bilateral common femoral and visualized portions of the superficial and profunda femoral arteries are patent without evidence of aneurysm, dissection, vasculitis or significant stenosis. Veins: No obvious venous abnormality. Review of the MIP images confirms the above findings. NON-VASCULAR Lower chest: Scattered areas of mild subsegmental atelectasis and/or scarring are noted in the visualized lung bases. Hepatobiliary: No suspicious cystic or solid  hepatic lesions. No intra or extrahepatic biliary ductal dilatation. Gallbladder is unremarkable in appearance. Pancreas: No pancreatic mass. No pancreatic ductal dilatation. No pancreatic or peripancreatic fluid collections or inflammatory changes. Spleen: Unremarkable. Adrenals/Urinary Tract: Bilateral kidneys and adrenal glands are normal in appearance. No hydroureteronephrosis. Urinary bladder is unremarkable in appearance. Stomach/Bowel: The appearance of the stomach is normal. There is no pathologic dilatation of small bowel or colon. A few scattered colonic diverticula are noted, without definite surrounding inflammatory changes to indicate an acute diverticulitis at this time. Postcontrast images demonstrates some areas of mural thickening and mucosal hyperenhancement associated with the distal rectum and anus suggestive of hemorrhoids. No definite accumulation of contrast material in this region on more delayed post-contrast images to suggest active extravasation. In addition, there are areas of prominent mural enhancement associated with the proximal ascending colon, most conspicuous on axial image 95 of series 7 during the arterial phase of the examination, without definite accumulation of contrast material in this region on more delayed imaging. Lymphatic: No lymphadenopathy noted in the abdomen or pelvis. Reproductive: Prostate gland and seminal vesicles are unremarkable in appearance. Other: No significant volume of ascites.  No pneumoperitoneum. Musculoskeletal: Severe S shaped scoliosis of the thoracolumbar spine convex to the right superiorly into the left inferiorly. There are no aggressive appearing lytic or blastic lesions noted in the visualized portions of the skeleton. IMPRESSION: VASCULAR 1. Fusiform aneurysmal dilatation of the infrarenal abdominal aorta which measures up to 3.6 cm in diameter. Recommend follow-up ultrasound every 2 years. This recommendation follows ACR consensus  guidelines: White Paper of the ACR Incidental Findings Committee II on Vascular Findings. J Am Coll Radiol 2013; 10:789-794. 2. The ostium of the IMA is poorly demonstrated on today's examination and may be chronically occluded, however, there does appear to be flow in the distal aspect of the vessel and its branches, presumably from collateral pathways. NON-VASCULAR 1. Mural  thickening and increased enhancement in the distal rectum and anus likely reflective of hemorrhoids. There is also increased mucosal enhancement in the region of the proximal ascending colon, as above, which is of uncertain etiology and significance. However, no accumulation of contrast material in either of these regions is noted on today's examination to indicate active extravasation. 2. Colonic diverticulosis without evidence of acute diverticulitis at this time. 3. Additional incidental findings, as above. Electronically Signed   By: Trudie Reed M.D.   On: Nov 05, 2023 06:54    Microbiology: No results found for this or any previous visit (from the past 240 hours).  Time spent: 30 minutes  Signed: Marcelle Smiling, MD 11-05-2023

## 2023-10-31 NOTE — Significant Event (Addendum)
Brief PCCM Note:  Called to room. Asystole on exam. Exam performed. Time of death 64. Spouse in room informed.  **ADDENDUM** Case was reviewed with the Medical Examiner Marisue Ivan de la [place]), who declined the case for further investigation.

## 2023-10-31 NOTE — ED Provider Notes (Addendum)
[facility]ENCY DEPT [facility] Emergency Department Provider Note MRN:  [id number]  Arrival date & time: 21-Nov-2023     Chief Complaint   GI Bleeding   History of Present Illness   Spencer Butler is a 68 y.o. year-old male with a history of lung cancer presenting to the ED with chief complaint of GI bleeding.  Found in bed by family sitting in large volume hematochezia.  This occurred at 3:45 AM.  Has since had multiple recurrent episodes of hematochezia, bright red blood with clots, including here in the emergency department.  Patient feels generally unwell.  Hypotensive with EMS.  Review of Systems  A thorough review of systems was obtained and all systems are negative except as noted in the HPI and PMH.   Patient's Health History    Past Medical History:  Diagnosis Date   Malignant neoplasm of mediastinum, part unspecified (HCC)    Malignant neoplasm of mediastinum, part unspecified (HCC)    Malignant neoplasm of mediastinum, part unspecified (HCC)     Past Surgical History:  Procedure Laterality Date   APPLICATION OF CRANIAL NAVIGATION N/A 01/08/2022   Procedure: APPLICATION OF CRANIAL NAVIGATION;  Surgeon: Jadene Pierini, MD;  Location: [facility] OR;  Service: Neurosurgery;  Laterality: N/A;   BURR HOLE Left 01/08/2022   Procedure: Left Ommaya placement with brainlab placement ventricular catheter;  Surgeon: Jadene Pierini, MD;  Location: [facility] OR;  Service: Neurosurgery;  Laterality: Left;    No family history on file.  Social History   Socioeconomic History   Marital status: Married    Spouse name: Not on file   Number of children: Not on file   Years of education: Not on file   Highest education level: Not on file  Occupational History   Not on file  Tobacco Use   Smoking status: Every Day    Current packs/day: 0.50    Types: Cigarettes   Smokeless tobacco: Never  Vaping Use   Vaping status: Never Used  Substance and Sexual Activity   Alcohol use: Never    Drug use: Never   Sexual activity: Not on file  Other Topics Concern   Not on file  Social History Narrative   Not on file   Social Drivers of Health   Financial Resource Strain: Low Risk  (09/05/2021)   Received from [facility], [facility]   Overall Financial Resource Strain (CARDIA)    Difficulty of Paying Living Expenses: Not very hard  Food Insecurity: No Food Insecurity (09/05/2021)   Received from [facility], [facility]   Hunger Vital Sign    Worried About Running Out of Food in the Last Year: Never true    Ran Out of Food in the Last Year: Never true  Transportation Needs: No Transportation Needs (09/05/2021)   Received from [facility], [facility]   [facility] - Transportation    Lack of Transportation (Medical): No    Lack of Transportation (Non-Medical): No  Physical Activity: Not on file  Stress: Not on file  Social Connections: Not on file  Intimate Partner Violence: Not on file     Physical Exam   Vitals:   11-21-2023 0700 11/21/2023 0702  BP: 90/70   Pulse: (!) 58 61  Resp: 12 16  Temp:    SpO2: 99% 100%    CONSTITUTIONAL: Ill-appearing NEURO/PSYCH:  Alert and oriented x 3, no focal deficits EYES:  eyes equal and reactive ENT/NECK:  no LAD, no  JVD CARDIO: Regular rate, well-perfused, normal S1 and S2 PULM:  CTAB no wheezing or rhonchi GI/GU:  non-distended, non-tender MSK/SPINE:  No gross deformities, no edema SKIN:  no rash, atraumatic   *Additional and/or pertinent findings included in MDM below  Diagnostic and Interventional Summary    EKG Interpretation Date/Time:  Wednesday October 28 2023 06:35:24 EST Ventricular Rate:  69 PR Interval:  163 QRS Duration:  158 QT Interval:  507 QTC Calculation: 548 R Axis:   -9  Text Interpretation: Sinus rhythm Right atrial enlargement Nonspecific intraventricular conduction delay Repol abnrm suggests ischemia, anterolateral Artifact in lead(s) I II III aVR aVL aVF V1 V2 V3 V4 V5  V6 Confirmed by Kennis Carina [phone]) on 10/23/2023 6:41:39 AM       Labs Reviewed  CBC - Abnormal; Notable for the following components:      Result Value   RDW 16.6 (*)    nRBC 1.7 (*)    All other components within normal limits  COMPREHENSIVE METABOLIC PANEL - Abnormal; Notable for the following components:   Potassium >7.5 (*)    CO2 13 (*)    Glucose, Bld 275 (*)    Calcium 6.5 (*)    Total Protein 3.8 (*)    Albumin 2.0 (*)    Anion gap 22 (*)    All other components within normal limits  PROTIME-INR - Abnormal; Notable for the following components:   Prothrombin Time 15.7 (*)    All other components within normal limits  DIC (DISSEMINATED INTRAVASCULAR COAGULATION)PANEL - Abnormal; Notable for the following components:   Prothrombin Time 15.6 (*)    aPTT 37 (*)    D-Dimer, Quant 2.15 (*)    All other components within normal limits  HEMOGLOBIN AND HEMATOCRIT, BLOOD - Abnormal; Notable for the following components:   Hemoglobin 11.8 (*)    HCT 35.7 (*)    All other components within normal limits  I-STAT VENOUS BLOOD GAS, ED - Abnormal; Notable for the following components:   pH, Ven 7.006 (*)    pO2, Ven 46 (*)    Bicarbonate 14.1 (*)    TCO2 16 (*)    Acid-base deficit 18.0 (*)    Potassium >8.5 (*)    Calcium, Ion 0.31 (*)    All other components within normal limits  DIC (DISSEMINATED INTRAVASCULAR COAGULATION)PANEL  DIC (DISSEMINATED INTRAVASCULAR COAGULATION)PANEL  DIC (DISSEMINATED INTRAVASCULAR COAGULATION)PANEL  DIC (DISSEMINATED INTRAVASCULAR COAGULATION)PANEL  HEMOGLOBIN AND HEMATOCRIT, BLOOD  HEMOGLOBIN AND HEMATOCRIT, BLOOD  HEMOGLOBIN AND HEMATOCRIT, BLOOD  HEMOGLOBIN AND HEMATOCRIT, BLOOD  TSH  TYPE AND SCREEN  MASSIVE TRANSFUSION PROTOCOL ORDER (BLOOD BANK NOTIFICATION)  PREPARE PLATELET PHERESIS  PREPARE CRYOPRECIPITATE  PREPARE FRESH FROZEN PLASMA  PREPARE RBC (CROSSMATCH)    CT ANGIO GI BLEED  Final Result      Medications  0.9 %   sodium chloride infusion (Manually program via Guardrails IV Fluids) (has no administration in time range)  pantoprazole (PROTONIX) injection 40 mg (40 mg Intravenous Not Given 10/24/2023 1308)    Followed by  pantoprazole (PROTONIX) injection 40 mg (has no administration in time range)  calcium gluconate 1 g/ 50 mL sodium chloride IVPB (2,000 mg Intravenous New Bag/Given 10/15/2023 0736)  calcium gluconate 1 g/ 50 mL sodium chloride IVPB (0 mg Intravenous Stopped 10/20/2023 0730)  iohexol (OMNIPAQUE) 350 MG/ML injection 75 mL (75 mLs Intravenous Contrast Given 10/08/2023 0628)  tranexamic acid (CYKLOKAPRON) IVPB 1,000 mg (0 mg Intravenous Stopped 10/08/2023 0701)  methylPREDNISolone sodium succinate (SOLU-MEDROL) 125  mg/2 mL injection 125 mg (125 mg Intravenous Given 10/03/2023 0722)  ondansetron (ZOFRAN) injection 4 mg (4 mg Intravenous Given 10/18/2023 0728)  calcium gluconate 1 g/ 50 mL sodium chloride IVPB (0 mg Intravenous Stopped 10/22/2023 0730)     Procedures  /  Critical Care .Critical Care  Performed by: Sabas Sous, MD Authorized by: Sabas Sous, MD   Critical care provider statement:    Critical care time (minutes):  80   Critical care was necessary to treat or prevent imminent or life-threatening deterioration of the following conditions:  Shock   Critical care was time spent personally by me on the following activities:  Development of treatment plan with patient or surrogate, discussions with consultants, evaluation of patient's response to treatment, examination of patient, ordering and review of laboratory studies, ordering and review of radiographic studies, ordering and performing treatments and interventions, pulse oximetry, re-evaluation of patient's condition and review of old charts .Ultrasound ED Peripheral IV (Provider)  Date/Time: 10/15/2023 6:50 AM  Performed by: Sabas Sous, MD Authorized by: Sabas Sous, MD   Procedure details:    Indications: poor IV access      Skin Prep: chlorhexidine gluconate     Location:  Right AC   Angiocath:  20 G   Bedside Ultrasound Guided: Yes     Patient tolerated procedure without complications: Yes     Dressing applied: Yes   Central Line  Date/Time: 10/21/2023 7:50 AM  Performed by: Sabas Sous, MD Authorized by: Sabas Sous, MD   Consent:    Consent obtained:  Emergent situation Universal protocol:    Patient identity confirmed:  Hospital-assigned identification number Pre-procedure details:    Indication(s): central venous access     Skin preparation:  Chlorhexidine Procedure details:    Location:  R femoral   Patient position:  Supine   Procedural supplies:  Double lumen   Catheter size: 12Fr.   Landmarks identified: yes     Ultrasound guidance: yes     Ultrasound guidance timing: real time     Number of attempts:  1   Successful placement: yes   Post-procedure details:    Post-procedure:  Dressing applied and line sutured   Assessment:  Blood return through all ports and free fluid flow   Procedure completion:  Tolerated well, no immediate complications Comments:     Emergent central line placed in the setting of hypovolemic shock requiring persistent blood products, continuing to bleed.  Aseptic technique utilized but not fully sterile, recommend removal or replacement in 24 to 48 hours.   ED Course and Medical Decision Making  Initial Impression and Ddx Large-volume hematochezia with hypotension, ill-appearing patient.  Provided with 2 units emergent PRBCs.  Had received 1 L crystalloid with EMS.  During episode of hypotension was briefly losing his mental status but this recovered with the blood resuscitation.  Case discussed with GI on-call, recommending CTA GI bleed study, admission to ICU, continued supportive care.  Blood pressure improving with 2 units of blood running.  Stable enough for CT imaging.  Some soft blood pressures in CT with systolics in the 80s prompting MTP initiation.   Provided with calcium, TXA, Protonix.  Blood pressures have stabilized, patient has received FFP, platelets, CVL kit at bedside but currently has 3 peripheral IVs and seems to be settling out, no further episodes of bleeding evident at this time.  Past medical/surgical history that increases complexity of ED encounter: Lung cancer  Interpretation of  Diagnostics I personally reviewed the EKG and my interpretation is as follows: Sinus rhythm  VBG results may be inaccurate due to blood administration versus hemolysis.  Significant  Patient Reassessment and Ultimate Disposition/Management     Continued soft blood pressure, 90 systolic, providing third unit of PRBCs, accepted for admission by intensivist service.  Patient management required discussion with the following services or consulting groups:  Intensivist Service and Gastroenterology  Complexity of Problems Addressed Acute illness or injury that poses threat of life of bodily function  Additional Data Reviewed and Analyzed Further history obtained from: Further history from spouse/family member  Additional Factors Impacting ED Encounter Risk Consideration of hospitalization  Elmer Sow. Pilar Plate, MD [facility] Emergency Medicine [facility] [email or web address]  Final Clinical Impressions(s) / ED Diagnoses     ICD-10-CM   1. Lower GI bleed  K92.2     2. Hypovolemic shock (HCC)  R57.1       ED Discharge Orders     None        Discharge Instructions Discussed with and Provided to Patient:   Discharge Instructions   None      Sabas Sous, MD 09/30/2023 1610    Sabas Sous, MD 10/20/2023 [phone]

## 2023-10-31 NOTE — H&P (Signed)
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  He presents with new onset of GI bleed woke up with the bed full of blood and clots.  He is hypotensive at [place] DO NOT RESUSCITATE by his wife and has poor expectations of survival.  Pertinent  Medical History   Past Medical History:  Diagnosis Date   Malignant neoplasm of mediastinum, part unspecified (HCC)    Malignant neoplasm of mediastinum, part unspecified (HCC)    Malignant neoplasm of mediastinum, part unspecified (HCC)      Significant Hospital Events: Including procedures, antibiotic start and stop dates in addition to other pertinent events     Interim History / Subjective:  Presented with acute GI bleed  Objective   Blood pressure (!) 78/67, pulse 62, temperature (!) 95 F (35 C), temperature source Rectal, resp. rate (!) 22, height 5\' 7"  (1.702 m), weight 68 kg, SpO2 91%.        Intake/Output Summary (Last 24 hours) at 10/13/2023 0842 Last data filed at 10/04/2023 4098 Gross per 24 hour  Intake 4706 ml  Output --  Net 4706 ml   Filed Weights   10/18/2023 0531  Weight: 68 kg    Examination: General: Frail 68 year old poorly responsive HENT: No JVD or lymphadenopathy is appreciated Lungs: Creased air movement Cardiovascular: Sinus rhythm 61 Abdomen: Slightly obese tender Extremities: Cool dry Neuro: Poorly responsive GU: No urine at this time  Resolved Hospital Problem list     Assessment & Plan:  GI bleed in the setting of squamous cell lung cancer in 2021 with metastases to the brain status post craniotomy and currently  on Decadron.  He presents with new onset of GI bleed found in the bed with blood and clots in the bed with him and noted to be hypotensive. He is now a DNR with continuation of medical care Proton pump inhibitor Blood replacement Serial CBC Avoid anticoagulants GI consult has been called and the plan is taken to the endoscopy today Vasopressors as needed  Hyperkalemia Recent Labs  Lab 10/23/2023 0601 10/02/2023 0604  K >7.5* >8.5*   Calcium chloride D50 10 units IV insulin Fluid resuscitation Serial c-Met  Squamous cell carcinoma with metastases to the brain Per oncology    Best Practice (right click and "Reselect all SmartList Selections" daily)   Diet/type: NPO DVT prophylaxis not indicated Pressure ulcer(s): N/A GI prophylaxis: PPI Lines: Central line Foley:  Yes, and it is still needed Code Status:  DNR Last date of multidisciplinary goals of care discussion [tbd] Wife updated at bed Labs   CBC: Recent Labs  Lab 10/22/2023 0601 10/19/2023 0604 10/08/2023 0641  WBC 6.5  --   --   HGB 15.0 15.3 11.8*  HCT 48.3 45.0 35.7*  MCV 88.5  --   --   PLT 173  --  178    Basic Metabolic Panel: Recent Labs  Lab 10/12/2023 0601 10/05/2023 0604  NA 139 135  K >7.5* >8.5*  CL 104  --   CO2 13*  --   GLUCOSE 275*  --   BUN 11  --   CREATININE 0.92  --   CALCIUM 6.5*  --  GFR: Estimated Creatinine Clearance: 73.8 mL/min (by C-G formula based on SCr of 0.92 mg/dL). Recent Labs  Lab 10/16/2023 0601  WBC 6.5    Liver Function Tests: Recent Labs  Lab 10/06/2023 0601  AST 18  ALT 10  ALKPHOS 52  BILITOT 0.9  PROT 3.8*  ALBUMIN 2.0*   No results for input(s): "LIPASE", "AMYLASE" in the last 168 hours. No results for input(s): "AMMONIA" in the last 168 hours.  ABG    Component Value Date/Time   HCO3 14.1 (L) 10/09/2023 0604   TCO2 16 (L) 10/04/2023 0604   ACIDBASEDEF 18.0 (H) 10/30/2023 0604   O2SAT 58 10/23/2023 0604     Coagulation Profile: Recent Labs   Lab 09/30/2023 0601 10/11/2023 0641  INR 1.2 1.2    Cardiac Enzymes: No results for input(s): "CKTOTAL", "CKMB", "CKMBINDEX", "TROPONINI" in the last 168 hours.  HbA1C: No results found for: "HGBA1C"  CBG: No results for input(s): "GLUCAP" in the last 168 hours.  Review of Systems:   na  Past Medical History:  He,  has a past medical history of Malignant neoplasm of mediastinum, part unspecified (HCC), Malignant neoplasm of mediastinum, part unspecified (HCC), and Malignant neoplasm of mediastinum, part unspecified (HCC).   Surgical History:   Past Surgical History:  Procedure Laterality Date   APPLICATION OF CRANIAL NAVIGATION N/A 01/08/2022   Procedure: APPLICATION OF CRANIAL NAVIGATION;  Surgeon: Jadene Pierini, MD;  Location: [facility] OR;  Service: Neurosurgery;  Laterality: N/A;   BURR HOLE Left 01/08/2022   Procedure: Left Ommaya placement with brainlab placement ventricular catheter;  Surgeon: Jadene Pierini, MD;  Location: [facility] OR;  Service: Neurosurgery;  Laterality: Left;     Social History:   reports that he has been smoking cigarettes. He has never used smokeless tobacco. He reports that he does not drink alcohol and does not use drugs.   Family History:  His family history is not on file.   Allergies No Known Allergies   Home Medications  Prior to Admission medications   Medication Sig Start Date End Date Taking? Authorizing Provider  amLODipine (NORVASC) 10 MG tablet TAKE 1 TABLET(10 MG) BY MOUTH DAILY 10/08/23   Vaslow, Georgeanna Lea, MD  benzonatate (TESSALON) 100 MG capsule Take 1 capsule (100 mg total) by mouth 3 (three) times daily as needed for cough. 07/22/22   Lewis, Dequincy A, MD  dexamethasone (DECADRON) 2 MG tablet Take 1 tablet (2 mg total) by mouth daily. 09/09/23   [name], MD  dronabinol (MARINOL) 2.5 MG capsule Take 2.5 mg by mouth 2 (two) times daily. 04/07/22   [provider]  lamoTRIgine (LAMICTAL) 100 MG tablet TAKE 2  TABLETS BY MOUTH DAILY AND 1 TABLET AT BEDTIME 09/21/23   Vaslow, Georgeanna Lea, MD  lidocaine-prilocaine (EMLA) cream Apply 1 Application topically as needed. 11/20/22   [name], MD  megestrol (MEGACE) 40 MG tablet Take 40 mg by mouth daily. 11/27/21   [provider]  ondansetron (ZOFRAN) 4 MG tablet Take 1 tablet (4 mg total) by mouth every 4 (four) hours as needed for nausea. 10/22/23   Weston Settle, MD  pantoprazole (PROTONIX) 20 MG tablet TAKE 1 TABLET(20 MG) BY MOUTH DAILY 07/07/23   Barbaraann Cao, Georgeanna Lea, MD     Critical care time: 45 min    Brett Canales Jordany Russett ACNP Acute Care Nurse Practitioner [facility]/Critical Care Please consult Amion 10/03/2023, 8:42 AM

## 2023-10-31 DEATH — deceased

## 2023-11-02 ENCOUNTER — Other Ambulatory Visit: Payer: Self-pay | Admitting: Internal Medicine

## 2023-11-12 ENCOUNTER — Other Ambulatory Visit: Payer: Medicare HMO

## 2023-11-13 DIAGNOSIS — D62 Acute posthemorrhagic anemia: Secondary | ICD-10-CM | POA: Diagnosis present

## 2023-11-16 ENCOUNTER — Ambulatory Visit: Payer: Medicare HMO | Admitting: Oncology

## 2023-11-16 ENCOUNTER — Other Ambulatory Visit: Payer: Medicare HMO

## 2023-11-17 ENCOUNTER — Ambulatory Visit: Payer: Medicare HMO | Admitting: Internal Medicine
